# Patient Record
Sex: Female | Born: 1965
Health system: Southern US, Community
[De-identification: ages and names within clinical notes are randomized; demographics above are authoritative.]

## PROBLEM LIST (undated history)

## (undated) DIAGNOSIS — F32A Depression, unspecified: Secondary | ICD-10-CM

## (undated) DIAGNOSIS — I10 Essential (primary) hypertension: Secondary | ICD-10-CM

## (undated) DIAGNOSIS — K219 Gastro-esophageal reflux disease without esophagitis: Secondary | ICD-10-CM

## (undated) DIAGNOSIS — F419 Anxiety disorder, unspecified: Secondary | ICD-10-CM

## (undated) DIAGNOSIS — R011 Cardiac murmur, unspecified: Secondary | ICD-10-CM

## (undated) DIAGNOSIS — T7840XA Allergy, unspecified, initial encounter: Secondary | ICD-10-CM

## (undated) DIAGNOSIS — J45909 Unspecified asthma, uncomplicated: Secondary | ICD-10-CM

## (undated) DIAGNOSIS — F329 Major depressive disorder, single episode, unspecified: Secondary | ICD-10-CM

## (undated) DIAGNOSIS — G4730 Sleep apnea, unspecified: Secondary | ICD-10-CM

## (undated) DIAGNOSIS — E785 Hyperlipidemia, unspecified: Secondary | ICD-10-CM

## (undated) DIAGNOSIS — Z720 Tobacco use: Secondary | ICD-10-CM

## (undated) HISTORY — PX: OTHER SURGICAL HISTORY: SHX169

## (undated) HISTORY — DX: Tobacco use: Z72.0

## (undated) HISTORY — DX: Sleep apnea, unspecified: G47.30

## (undated) HISTORY — DX: Cardiac murmur, unspecified: R01.1

## (undated) HISTORY — DX: Unspecified asthma, uncomplicated: J45.909

## (undated) HISTORY — DX: Depression, unspecified: F32.A

## (undated) HISTORY — DX: Anxiety disorder, unspecified: F41.9

## (undated) HISTORY — DX: Hyperlipidemia, unspecified: E78.5

## (undated) HISTORY — DX: Allergy, unspecified, initial encounter: T78.40XA

## (undated) HISTORY — DX: Major depressive disorder, single episode, unspecified: F32.9

---

## 2006-04-23 ENCOUNTER — Ambulatory Visit: Payer: Self-pay | Admitting: Endocrinology

## 2006-06-19 HISTORY — PX: ABDOMINAL HYSTERECTOMY: SHX81

## 2007-04-04 LAB — HM PAP SMEAR: HM Pap smear: NORMAL

## 2007-11-06 ENCOUNTER — Ambulatory Visit: Payer: Self-pay | Admitting: Obstetrics & Gynecology

## 2007-11-14 ENCOUNTER — Ambulatory Visit: Payer: Self-pay | Admitting: Obstetrics & Gynecology

## 2008-01-27 ENCOUNTER — Ambulatory Visit: Payer: Self-pay | Admitting: Obstetrics & Gynecology

## 2008-02-11 ENCOUNTER — Ambulatory Visit: Payer: Self-pay | Admitting: Obstetrics & Gynecology

## 2009-04-07 ENCOUNTER — Observation Stay: Payer: Self-pay | Admitting: Internal Medicine

## 2010-03-08 ENCOUNTER — Ambulatory Visit: Payer: Self-pay

## 2010-07-05 ENCOUNTER — Ambulatory Visit: Payer: Self-pay | Admitting: Internal Medicine

## 2010-07-26 ENCOUNTER — Ambulatory Visit: Payer: Self-pay | Admitting: Internal Medicine

## 2010-11-07 ENCOUNTER — Emergency Department: Payer: Self-pay | Admitting: Emergency Medicine

## 2011-04-04 LAB — HM MAMMOGRAPHY: HM Mammogram: NORMAL

## 2011-05-23 ENCOUNTER — Ambulatory Visit: Payer: Self-pay | Admitting: Internal Medicine

## 2011-06-05 ENCOUNTER — Ambulatory Visit: Payer: Self-pay | Admitting: Family Medicine

## 2011-09-16 ENCOUNTER — Ambulatory Visit: Payer: Self-pay | Admitting: Neurology

## 2012-04-03 ENCOUNTER — Other Ambulatory Visit: Payer: Self-pay

## 2012-04-03 ENCOUNTER — Encounter: Payer: Self-pay | Admitting: Internal Medicine

## 2012-04-03 ENCOUNTER — Ambulatory Visit (INDEPENDENT_AMBULATORY_CARE_PROVIDER_SITE_OTHER): Payer: PRIVATE HEALTH INSURANCE | Admitting: Internal Medicine

## 2012-04-03 VITALS — BP 118/72 | HR 81 | Temp 98.3°F | Ht 65.0 in | Wt 212.2 lb

## 2012-04-03 DIAGNOSIS — F17200 Nicotine dependence, unspecified, uncomplicated: Secondary | ICD-10-CM

## 2012-04-03 DIAGNOSIS — F411 Generalized anxiety disorder: Secondary | ICD-10-CM

## 2012-04-03 DIAGNOSIS — J45909 Unspecified asthma, uncomplicated: Secondary | ICD-10-CM

## 2012-04-03 DIAGNOSIS — Z720 Tobacco use: Secondary | ICD-10-CM

## 2012-04-03 DIAGNOSIS — E785 Hyperlipidemia, unspecified: Secondary | ICD-10-CM

## 2012-04-03 MED ORDER — ALPRAZOLAM ER 1 MG PO TB24: 1.0000 mg | ORAL_TABLET | ORAL | Status: DC

## 2012-04-03 MED ORDER — BUPROPION HCL ER (SR) 150 MG PO TB12: 150.0000 mg | ORAL_TABLET | Freq: Every day | ORAL | Status: DC

## 2012-04-03 NOTE — Telephone Encounter (Signed)
Patient request refill for Celexa 10 mg sent to [facility].
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
## 2012-04-03 NOTE — Patient Instructions (Addendum)
Decrease the  wellbutrin to once daily in the morning until gone   Start alprazolam Er 1 tablet daily in the morning ,  You can start with 1/2 tablet if too strong .  Start it this Friday   continue the abilify and the citalopram  Use MyChart to let me know in one week how you are feeling.

## 2012-04-04 ENCOUNTER — Telehealth: Payer: Self-pay | Admitting: Internal Medicine

## 2012-04-04 MED ORDER — CITALOPRAM HYDROBROMIDE 10 MG PO TABS: 10.0000 mg | ORAL_TABLET | Freq: Every day | ORAL | Status: DC

## 2012-04-04 NOTE — Telephone Encounter (Signed)
Spouse came in checking on rx citalopam [facility]

## 2012-04-05 NOTE — Telephone Encounter (Signed)
Rx has been sent electronic to [facility]

## 2012-04-06 ENCOUNTER — Encounter: Payer: Self-pay | Admitting: Internal Medicine

## 2012-04-06 DIAGNOSIS — J301 Allergic rhinitis due to pollen: Secondary | ICD-10-CM | POA: Insufficient documentation

## 2012-04-06 DIAGNOSIS — F418 Other specified anxiety disorders: Secondary | ICD-10-CM | POA: Insufficient documentation

## 2012-04-06 DIAGNOSIS — J441 Chronic obstructive pulmonary disease with (acute) exacerbation: Secondary | ICD-10-CM | POA: Insufficient documentation

## 2012-04-06 DIAGNOSIS — Z720 Tobacco use: Secondary | ICD-10-CM | POA: Insufficient documentation

## 2012-04-06 DIAGNOSIS — E785 Hyperlipidemia, unspecified: Secondary | ICD-10-CM | POA: Insufficient documentation

## 2012-04-06 DIAGNOSIS — R011 Cardiac murmur, unspecified: Secondary | ICD-10-CM | POA: Insufficient documentation

## 2012-04-06 DIAGNOSIS — F411 Generalized anxiety disorder: Secondary | ICD-10-CM | POA: Insufficient documentation

## 2012-04-06 DIAGNOSIS — T7840XA Allergy, unspecified, initial encounter: Secondary | ICD-10-CM | POA: Insufficient documentation

## 2012-04-06 NOTE — Assessment & Plan Note (Signed)
Given her chronic tobacco abuse, I suspect that her symptoms are due to either chronic bronchitis or  COPD. She has never had pulmonary function tests. We will address this at the next visit.

## 2012-04-06 NOTE — Assessment & Plan Note (Addendum)
She has a strong family history of coronary artery disease. She's also a daily smoker. I requested labs from her previous physician. Goal the have her do to quit smoking and

## 2012-04-06 NOTE — Assessment & Plan Note (Signed)
She's been prescribed citalopram Wellbutrin and Abilify by her previous physician. I think that the Wellbutrin is probably aggravating her anxiety although it may be treating her depression. I have reduced her dose to one tablet daily in the morning and admitted the afternoon dose. I then alprazolam ER for management of persistent anxiety and panic. I will have her come back in one month.

## 2012-04-06 NOTE — Assessment & Plan Note (Signed)
Risks of continued tobacco use were discussed. She is not currently interested in tobacco cessation. We will readdress this once her anxiety is under control.

## 2012-04-09 ENCOUNTER — Encounter: Payer: Self-pay | Admitting: Internal Medicine

## 2012-04-09 ENCOUNTER — Telehealth: Payer: Self-pay | Admitting: Internal Medicine

## 2012-04-09 DIAGNOSIS — G8929 Other chronic pain: Secondary | ICD-10-CM | POA: Insufficient documentation

## 2012-04-09 DIAGNOSIS — M25519 Pain in unspecified shoulder: Secondary | ICD-10-CM | POA: Insufficient documentation

## 2012-04-09 MED ORDER — TRAMADOL HCL 50 MG PO TABS: 50.0000 mg | ORAL_TABLET | Freq: Three times a day (TID) | ORAL | Status: DC | PRN

## 2012-04-09 NOTE — Telephone Encounter (Signed)
Caller: [name]/Patient; Patient Name: Melanie Fisher; PCP: Duncan Dull (Adults only); Best Callback Phone Number: [phone]  Patient states she was seen in office by Dr. Darrick Huntsman for left arm discomfort. States no imrovement after using BenGay, Tylenol Arthritis Strength. Patient c/o left upper arm pain, onset X 1 year. States sx increased X 3 months. States she developed new tingling in fingertips, onset 04/08/12. Denies new weakness or tumbness. Fingers pink and warm. No swelling noted. States pain is intolerable with movement. Triage per Arm Non-Injury Protocol and Numbness or tingling Protocol. No emergent sx identified. Care advice given per guidelines related to positive triage assessment for " Severe pain with movement that limits normal activities" and " New or worsening change in sensation in extremity." Patient advised to avoid activities that cause severe pain. Advised no driving. Call back parameters reviewed. Patient advised appt. within 24 hours, per Protocol. Patient declines offered appt. for 1130 04/09/12. States she will return call 04/10/12 0800 to schedule appt. Call back parameters reviewed. Patient verbalizes understanding.

## 2012-04-09 NOTE — Telephone Encounter (Signed)
Please tell her that I have sent an rx for tramadol,  A pain reliver,  To [facility],  She can take it every 6 to 8 hours as needed  for pain .  It will not upset her stomach and can be combined with tyelnol or alleve.

## 2012-04-09 NOTE — Telephone Encounter (Signed)
Left message on patient home vm letting her know that Rx Tramadol was sent to [facility] .

## 2012-05-06 ENCOUNTER — Other Ambulatory Visit: Payer: Self-pay | Admitting: Internal Medicine

## 2012-05-06 ENCOUNTER — Encounter: Payer: Self-pay | Admitting: Internal Medicine

## 2012-05-06 ENCOUNTER — Ambulatory Visit (INDEPENDENT_AMBULATORY_CARE_PROVIDER_SITE_OTHER): Payer: PRIVATE HEALTH INSURANCE | Admitting: Internal Medicine

## 2012-05-06 VITALS — BP 120/72 | HR 92 | Temp 98.2°F | Ht 65.0 in | Wt 215.5 lb

## 2012-05-06 DIAGNOSIS — J45909 Unspecified asthma, uncomplicated: Secondary | ICD-10-CM

## 2012-05-06 DIAGNOSIS — J4489 Other specified chronic obstructive pulmonary disease: Secondary | ICD-10-CM

## 2012-05-06 DIAGNOSIS — M25519 Pain in unspecified shoulder: Secondary | ICD-10-CM

## 2012-05-06 DIAGNOSIS — F17200 Nicotine dependence, unspecified, uncomplicated: Secondary | ICD-10-CM

## 2012-05-06 DIAGNOSIS — F32A Depression, unspecified: Secondary | ICD-10-CM

## 2012-05-06 DIAGNOSIS — Z720 Tobacco use: Secondary | ICD-10-CM

## 2012-05-06 DIAGNOSIS — IMO0002 Reserved for concepts with insufficient information to code with codable children: Secondary | ICD-10-CM

## 2012-05-06 DIAGNOSIS — F529 Unspecified sexual dysfunction not due to a substance or known physiological condition: Secondary | ICD-10-CM

## 2012-05-06 DIAGNOSIS — Z62810 Personal history of physical and sexual abuse in childhood: Secondary | ICD-10-CM

## 2012-05-06 DIAGNOSIS — F329 Major depressive disorder, single episode, unspecified: Secondary | ICD-10-CM

## 2012-05-06 DIAGNOSIS — J449 Chronic obstructive pulmonary disease, unspecified: Secondary | ICD-10-CM

## 2012-05-06 DIAGNOSIS — G8929 Other chronic pain: Secondary | ICD-10-CM

## 2012-05-06 DIAGNOSIS — F3289 Other specified depressive episodes: Secondary | ICD-10-CM

## 2012-05-06 LAB — COMPREHENSIVE METABOLIC PANEL
Albumin: 4.2 g/dL (ref 3.4–5.0)
Alkaline Phosphatase: 83 U/L (ref 50–136)
Anion Gap: 7 (ref 7–16)
BUN: 30 mg/dL — ABNORMAL HIGH (ref 7–18)
Bilirubin,Total: 0.3 mg/dL (ref 0.2–1.0)
Calcium, Total: 9.3 mg/dL (ref 8.5–10.1)
Chloride: 109 mmol/L — ABNORMAL HIGH (ref 98–107)
Co2: 24 mmol/L (ref 21–32)
Creatinine: 0.87 mg/dL (ref 0.60–1.30)
EGFR (African American): >60
EGFR (Non-African Amer.): >60
Glucose: 93 mg/dL (ref 65–99)
Osmolality: 285 (ref 275–301)
Potassium: 3.8 mmol/L (ref 3.5–5.1)
SGOT(AST): 17 U/L (ref 15–37)
SGPT (ALT): 27 U/L (ref 12–78)
Sodium: 140 mmol/L (ref 136–145)
Total Protein: 7.9 g/dL (ref 6.4–8.2)

## 2012-05-06 LAB — CBC WITH DIFFERENTIAL/PLATELET
Basophil #: 0 10*3/uL (ref 0.0–0.1)
Basophil %: 0.5 %
Eosinophil #: 0.1 10*3/uL (ref 0.0–0.7)
Eosinophil %: 1.3 %
HCT: 39.1 % (ref 35.0–47.0)
HGB: 13 g/dL (ref 12.0–16.0)
Lymphocyte #: 1.8 10*3/uL (ref 1.0–3.6)
Lymphocyte %: 23.5 %
MCH: 30.9 pg (ref 26.0–34.0)
MCHC: 33.4 g/dL (ref 32.0–36.0)
MCV: 93 fL (ref 80–100)
Monocyte #: 0.4 x10 3/mm (ref 0.2–0.9)
Monocyte %: 5.6 %
Neutrophil #: 5.3 10*3/uL (ref 1.4–6.5)
Neutrophil %: 69.1 %
Platelet: 201 10*3/uL (ref 150–440)
RBC: 4.23 10*6/uL (ref 3.80–5.20)
RDW: 12.5 % (ref 11.5–14.5)
WBC: 7.6 10*3/uL (ref 3.6–11.0)

## 2012-05-06 LAB — TSH: Thyroid Stimulating Horm: 3.01 u[IU]/mL

## 2012-05-06 LAB — LIPID PANEL
Cholesterol: 189 mg/dL (ref 0–200)
HDL Cholesterol: 64 mg/dL — ABNORMAL HIGH (ref 40–60)
Ldl Cholesterol, Calc: 104 mg/dL — ABNORMAL HIGH (ref 0–100)
Triglycerides: 106 mg/dL (ref 0–200)
VLDL Cholesterol, Calc: 21 mg/dL (ref 5–40)

## 2012-05-06 MED ORDER — TRAMADOL HCL 50 MG PO TABS: 50.0000 mg | ORAL_TABLET | Freq: Three times a day (TID) | ORAL | Status: DC | PRN

## 2012-05-06 MED ORDER — BUPROPION HCL ER (SR) 150 MG PO TB12: 150.0000 mg | ORAL_TABLET | Freq: Two times a day (BID) | ORAL | Status: DC
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
## 2012-05-06 NOTE — Patient Instructions (Addendum)
I am ordering pulmonary function tests to assess your lung function.  Please try to quit smoking  Resume wellbutrin at twice daily for your concentration

## 2012-05-07 LAB — HEMOGLOBIN A1C: Hemoglobin A1C: 5.7 % (ref 4.2–6.3)

## 2012-05-08 ENCOUNTER — Encounter: Payer: Self-pay | Admitting: Internal Medicine
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
## 2012-05-08 NOTE — Assessment & Plan Note (Signed)
likley chronic bronchitis.COPD secondary to tobacco abuse. Needs PFTs.  continue albuterol.  Smoking cessation encouraged

## 2012-05-08 NOTE — Assessment & Plan Note (Signed)
Risks of continued tobacco use were discussed. She is  currently interested in tobacco cessation and already taking wellbutrin.  Chantix not advised due to current symptoms of depression/

## 2012-05-14 ENCOUNTER — Ambulatory Visit: Payer: Self-pay | Admitting: Internal Medicine

## 2012-05-17 ENCOUNTER — Encounter: Payer: Self-pay | Admitting: Internal Medicine

## 2012-05-20 ENCOUNTER — Encounter: Payer: Self-pay | Admitting: Internal Medicine

## 2012-05-23 LAB — PULMONARY FUNCTION TEST

## 2012-05-26 ENCOUNTER — Telehealth: Payer: Self-pay | Admitting: Internal Medicine

## 2012-05-26 DIAGNOSIS — J449 Chronic obstructive pulmonary disease, unspecified: Secondary | ICD-10-CM

## 2012-05-26 NOTE — Telephone Encounter (Signed)
Pulmonary function tests confirm that she has COPD, at the young age of 73.  Its time for her to quit smoking before these changes become permanent

## 2012-05-27 NOTE — Telephone Encounter (Signed)
Patient notified of instructions as directed. 

## 2012-06-05 ENCOUNTER — Telehealth: Payer: Self-pay | Admitting: Internal Medicine

## 2012-06-05 NOTE — Telephone Encounter (Signed)
Alprazolam ER 1 mg tab  Take one tablet every morning # 30

## 2012-06-06 ENCOUNTER — Other Ambulatory Visit: Payer: Self-pay

## 2012-06-06 NOTE — Telephone Encounter (Signed)
Refill request for Alprazolam 1 mg. Ok to refill?

## 2012-06-09 MED ORDER — ALPRAZOLAM ER 1 MG PO TB24: 1.0000 mg | ORAL_TABLET | ORAL | Status: DC

## 2012-06-10 ENCOUNTER — Other Ambulatory Visit: Payer: Self-pay | Admitting: *Deleted

## 2012-09-03 ENCOUNTER — Encounter: Payer: Self-pay | Admitting: Internal Medicine

## 2012-09-03 ENCOUNTER — Ambulatory Visit (INDEPENDENT_AMBULATORY_CARE_PROVIDER_SITE_OTHER): Payer: 59 | Admitting: Internal Medicine

## 2012-09-03 ENCOUNTER — Other Ambulatory Visit (HOSPITAL_COMMUNITY)
Admission: RE | Admit: 2012-09-03 | Discharge: 2012-09-03 | Disposition: A | Discharge status: Home / Self Care | Payer: 59 | Source: Ambulatory Visit | Attending: Internal Medicine | Admitting: Internal Medicine

## 2012-09-03 VITALS — BP 128/90 | HR 75 | Temp 98.0°F | Ht 64.0 in | Wt 207.0 lb

## 2012-09-03 DIAGNOSIS — Z0000 Encounter for general adult medical examination without abnormal findings: Secondary | ICD-10-CM

## 2012-09-03 DIAGNOSIS — Z01419 Encounter for gynecological examination (general) (routine) without abnormal findings: Secondary | ICD-10-CM | POA: Insufficient documentation

## 2012-09-03 DIAGNOSIS — Z1151 Encounter for screening for human papillomavirus (HPV): Secondary | ICD-10-CM | POA: Insufficient documentation

## 2012-09-03 DIAGNOSIS — Z124 Encounter for screening for malignant neoplasm of cervix: Secondary | ICD-10-CM

## 2012-09-03 MED ORDER — ALPRAZOLAM ER 1 MG PO TB24: 1.0000 mg | ORAL_TABLET | ORAL | Status: DC

## 2012-09-03 MED ORDER — CITALOPRAM HYDROBROMIDE 10 MG PO TABS: 10.0000 mg | ORAL_TABLET | Freq: Every day | ORAL | Status: DC

## 2012-09-03 MED ORDER — MOMETASONE FURO-FORMOTEROL FUM 100-5 MCG/ACT IN AERO: 2.0000 | INHALATION_SPRAY | Freq: Two times a day (BID) | RESPIRATORY_TRACT | Status: DC

## 2012-09-03 MED ORDER — BUPROPION HCL ER (SR) 150 MG PO TB12: 150.0000 mg | ORAL_TABLET | Freq: Two times a day (BID) | ORAL | Status: DC

## 2012-09-03 MED ORDER — TRAMADOL HCL 50 MG PO TABS: 50.0000 mg | ORAL_TABLET | Freq: Three times a day (TID) | ORAL | Status: DC | PRN

## 2012-09-03 MED ORDER — ARIPIPRAZOLE 10 MG PO TABS: 10.0000 mg | ORAL_TABLET | Freq: Every day | ORAL | Status: DC

## 2012-09-03 NOTE — Progress Notes (Signed)
Patient ID: Melanie Fisher, female   DOB: 04/26/1966, 47 y.o.   MRN: [id number]  Subjective:     Melanie Fisher is a 47 y.o. female and is here for a comprehensive physical exam. The patient reports no problems.  History   Social History  . Marital Status: Married    Spouse Name: N/A    Number of Children: N/A  . Years of Education: N/A   Occupational History  . Not on file.   Social History Main Topics  . Smoking status: Current Every Day Smoker -- 0.15 packs/day  . Smokeless tobacco: Not on file  . Alcohol Use: Yes  . Drug Use: No  . Sexually Active: Not on file   Other Topics Concern  . Not on file   Social History Narrative  . No narrative on file   Health Maintenance  Topic Date Due  . Influenza Vaccine  02/17/1965  . Tetanus/tdap  08/07/1983  . Pap Smear  04/03/2010    The following portions of the patient's history were reviewed and updated as appropriate: allergies, current medications, past family history, past medical history, past social history, past surgical history and problem list.  Review of Systems A comprehensive review of systems was negative.   Objective:   General Appearance:    Alert, cooperative, no distress, appears stated age  Head:    Normocephalic, without obvious abnormality, atraumatic  Eyes:    PERRL, conjunctiva/corneas clear, EOM's intact, fundi    benign, both eyes  Ears:    Normal TM's and external ear canals, both ears  Nose:   Nares normal, septum midline, mucosa normal, no drainage    or sinus tenderness  Throat:   Lips, mucosa, and tongue normal; teeth and gums normal  Neck:   Supple, symmetrical, trachea midline, no adenopathy;    thyroid:  no enlargement/tenderness/nodules; no carotid   bruit or JVD  Back:     Symmetric, no curvature, ROM normal, no CVA tenderness  Lungs:     Clear to auscultation bilaterally, respirations unlabored  Chest Wall:    No tenderness or deformity   Heart:    Regular rate and rhythm, S1  and S2 normal, no murmur, rub   or gallop  Breast Exam:    No tenderness, masses, or nipple abnormality  Abdomen:     Soft, non-tender, bowel sounds active all four quadrants,    no masses, no organomegaly  Genitalia:    Pelvic: cervix normal in appearance, external genitalia normal, no adnexal masses or tenderness, no cervical motion tenderness, rectovaginal septum normal, uterus normal size, shape, and consistency and vagina normal without discharge  Extremities:   Extremities normal, atraumatic, no cyanosis or edema  Pulses:   2+ and symmetric all extremities  Skin:   Skin color, texture, turgor normal, no rashes or lesions  Lymph nodes:   Cervical, supraclavicular, and axillary nodes normal  Neurologic:   CNII-XII intact, normal strength, sensation and reflexes    throughout    Assessment:   Routine general medical examination at a health care facility Annual exam including breast , pelvic and PAP smear were done today. All screenings have been brought up to date.    Updated Medication List Outpatient Encounter Prescriptions as of 09/03/2012  Medication Sig Dispense Refill  . ALPRAZolam (XANAX XR) 1 MG 24 hr tablet Take 1 tablet (1 mg total) by mouth every morning.  30 tablet  3  . ARIPiprazole (ABILIFY) 10 MG tablet Take 1 tablet (10 mg total)  by mouth daily.  30 tablet  5  . buPROPion (WELLBUTRIN SR) 150 MG 12 hr tablet Take 1 tablet (150 mg total) by mouth 2 (two) times daily.  60 tablet  5  . citalopram (CELEXA) 10 MG tablet Take 1 tablet (10 mg total) by mouth daily.  30 tablet  3  . mometasone-formoterol (DULERA) 100-5 MCG/ACT AERO Inhale 2 puffs into the lungs 2 (two) times daily.  13 g  11  . traMADol (ULTRAM) 50 MG tablet Take 1 tablet (50 mg total) by mouth every 8 (eight) hours as needed for pain.  90 tablet  3  . [DISCONTINUED] ALPRAZolam (XANAX XR) 1 MG 24 hr tablet Take 1 tablet (1 mg total) by mouth every morning.  30 tablet  3  . [DISCONTINUED] ARIPiprazole (ABILIFY)  10 MG tablet Take 10 mg by mouth daily.      . [DISCONTINUED] buPROPion (WELLBUTRIN SR) 150 MG 12 hr tablet Take 1 tablet (150 mg total) by mouth 2 (two) times daily.  60 tablet  5  . [DISCONTINUED] citalopram (CELEXA) 10 MG tablet Take 1 tablet (10 mg total) by mouth daily.  30 tablet  3  . [DISCONTINUED] mometasone-formoterol (DULERA) 100-5 MCG/ACT AERO Inhale 2 puffs into the lungs 2 (two) times daily.      . [DISCONTINUED] traMADol (ULTRAM) 50 MG tablet Take 1 tablet (50 mg total) by mouth every 8 (eight) hours as needed for pain.  90 tablet  3   No facility-administered encounter medications on file as of 09/03/2012.

## 2012-09-04 DIAGNOSIS — Z0000 Encounter for general adult medical examination without abnormal findings: Secondary | ICD-10-CM | POA: Insufficient documentation

## 2012-09-04 NOTE — Assessment & Plan Note (Signed)
Annual exam including breast , pelvic and PAP smear were done today. All screenings have been brought up to date.

## 2012-09-06 ENCOUNTER — Encounter: Payer: Self-pay | Admitting: General Practice

## 2012-09-11 LAB — HM PAP SMEAR: HM Pap smear: NORMAL

## 2012-09-19 ENCOUNTER — Telehealth: Payer: Self-pay | Admitting: Internal Medicine
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
## 2012-09-20 ENCOUNTER — Telehealth: Payer: Self-pay | Admitting: Internal Medicine

## 2012-09-20 NOTE — Telephone Encounter (Signed)
Pt was returning office call. Rn checked EPIC and notified pt that per her medication request earlier/pt needs an appt. Pt unable to schedule until next week/pt transferred to the scheduler.

## 2012-09-20 NOTE — Telephone Encounter (Signed)
Pt called cannot reach. Left a message with a family member also for her to return the call.

## 2012-09-20 NOTE — Telephone Encounter (Signed)
Please offer appt with kme or raqel todayl,  Cannot prescribve sedatives by phone

## 2012-09-25 ENCOUNTER — Ambulatory Visit (INDEPENDENT_AMBULATORY_CARE_PROVIDER_SITE_OTHER): Payer: 59 | Admitting: Internal Medicine

## 2012-09-25 ENCOUNTER — Encounter: Payer: Self-pay | Admitting: Internal Medicine

## 2012-09-25 VITALS — BP 142/82 | HR 82 | Temp 98.0°F | Resp 18 | Wt 210.2 lb

## 2012-09-25 DIAGNOSIS — F411 Generalized anxiety disorder: Secondary | ICD-10-CM

## 2012-09-25 MED ORDER — ZOLPIDEM TARTRATE ER 12.5 MG PO TBCR: 12.5000 mg | EXTENDED_RELEASE_TABLET | Freq: Every evening | ORAL | Status: DC | PRN

## 2012-09-25 NOTE — Patient Instructions (Addendum)
Take the alprazolam XR in the morning instead of evening,    Continue bupropion  For now twice daily  ambien cr for insomnia   I agree with using the hospital's counselling program.
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
## 2012-09-27 ENCOUNTER — Encounter: Payer: Self-pay | Admitting: Internal Medicine
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
## 2012-11-21 ENCOUNTER — Other Ambulatory Visit: Payer: Self-pay | Admitting: *Deleted

## 2012-11-21 MED ORDER — ZOLPIDEM TARTRATE ER 12.5 MG PO TBCR: 12.5000 mg | EXTENDED_RELEASE_TABLET | Freq: Every evening | ORAL | Status: DC | PRN

## 2012-11-21 MED ORDER — ALPRAZOLAM ER 1 MG PO TB24: 1.0000 mg | ORAL_TABLET | ORAL | Status: DC

## 2012-11-21 NOTE — Telephone Encounter (Signed)
Ok to refill,  printed rx  

## 2012-11-25 ENCOUNTER — Telehealth: Payer: Self-pay | Admitting: Internal Medicine

## 2012-11-25 NOTE — Telephone Encounter (Signed)
Called pharmacy to see if they received the prescriptions, they still had not received the rx. Phoned in the refills for patient prescriptions.

## 2012-11-25 NOTE — Telephone Encounter (Signed)
Pt spouse called [facility]  [facility] has not received her rx for xanax and ambien Please adivse

## 2012-12-14 ENCOUNTER — Emergency Department: Payer: Self-pay | Admitting: Emergency Medicine

## 2012-12-24 ENCOUNTER — Other Ambulatory Visit: Payer: Self-pay | Admitting: *Deleted

## 2012-12-25 MED ORDER — ALPRAZOLAM ER 1 MG PO TB24: 1.0000 mg | ORAL_TABLET | ORAL | Status: DC

## 2013-01-02 ENCOUNTER — Telehealth: Payer: Self-pay | Admitting: Internal Medicine

## 2013-01-02 NOTE — Telephone Encounter (Signed)
Pt is needing refill on Ambien and she uses [facility]

## 2013-01-03 MED ORDER — ZOLPIDEM TARTRATE ER 12.5 MG PO TBCR: 12.5000 mg | EXTENDED_RELEASE_TABLET | Freq: Every evening | ORAL | Status: DC | PRN

## 2013-01-03 NOTE — Telephone Encounter (Signed)
Last OV 09/25/12  Will print refill for Dr. Darrick Huntsman. Printed and on counter to sign.

## 2013-02-07 ENCOUNTER — Other Ambulatory Visit: Payer: Self-pay | Admitting: *Deleted

## 2013-02-12 MED ORDER — ZOLPIDEM TARTRATE ER 12.5 MG PO TBCR: 12.5000 mg | EXTENDED_RELEASE_TABLET | Freq: Every evening | ORAL | Status: DC | PRN

## 2013-02-12 NOTE — Telephone Encounter (Signed)
Okay to refill? Request was sent on Friday 8/22

## 2013-02-13 ENCOUNTER — Telehealth: Payer: Self-pay | Admitting: Internal Medicine

## 2013-02-13 NOTE — Telephone Encounter (Signed)
The pharmacy has not received the prescription for this patient's Ambien.

## 2013-02-13 NOTE — Telephone Encounter (Signed)
Script faxed.

## 2013-02-18 ENCOUNTER — Encounter: Payer: Self-pay | Admitting: Adult Health

## 2013-02-18 ENCOUNTER — Ambulatory Visit (INDEPENDENT_AMBULATORY_CARE_PROVIDER_SITE_OTHER): Payer: 59 | Admitting: Adult Health

## 2013-02-18 VITALS — BP 126/78 | HR 75 | Temp 98.2°F | Resp 12 | Ht 64.0 in | Wt 216.0 lb

## 2013-02-18 DIAGNOSIS — R21 Rash and other nonspecific skin eruption: Secondary | ICD-10-CM | POA: Insufficient documentation

## 2013-02-18 MED ORDER — VALACYCLOVIR HCL 500 MG PO TABS: ORAL_TABLET | ORAL | Status: DC

## 2013-02-18 MED ORDER — VALACYCLOVIR HCL 500 MG PO TABS: 500.0000 mg | ORAL_TABLET | Freq: Two times a day (BID) | ORAL | Status: DC

## 2013-02-18 NOTE — Progress Notes (Signed)
  Subjective:    Patient ID: Melanie Fisher, female    DOB: 1965-10-20, 47 y.o.   MRN: [id number]  HPI  Patient is a pleasant 47 year old female with a history of herpes who presents with a small outbreak on her left cheek. She first noticed the outbreak over the weekend. She denies pain at the site of rash. She has run out of medication that she took daily at one point. She has not taken medication in a while. She is interested in restarting prophylactic medication. No other concerns at this time.   Current Outpatient Prescriptions on File Prior to Visit  Medication Sig Dispense Refill  . ALPRAZolam (XANAX XR) 1 MG 24 hr tablet Take 1 tablet (1 mg total) by mouth every morning.  30 tablet  3  . ARIPiprazole (ABILIFY) 10 MG tablet Take 1 tablet (10 mg total) by mouth daily.  30 tablet  5  . buPROPion (WELLBUTRIN SR) 150 MG 12 hr tablet Take 1 tablet (150 mg total) by mouth 2 (two) times daily.  60 tablet  5  . citalopram (CELEXA) 10 MG tablet Take 1 tablet (10 mg total) by mouth daily.  30 tablet  3  . mometasone-formoterol (DULERA) 100-5 MCG/ACT AERO Inhale 2 puffs into the lungs 2 (two) times daily.  13 g  11  . traMADol (ULTRAM) 50 MG tablet Take 1 tablet (50 mg total) by mouth every 8 (eight) hours as needed for pain.  90 tablet  3  . zolpidem (AMBIEN CR) 12.5 MG CR tablet Take 1 tablet (12.5 mg total) by mouth at bedtime as needed for sleep.  30 tablet  0   No current facility-administered medications on file prior to visit.    Review of Systems  Skin: Positive for rash.       Left cheek    BP 126/78  Pulse 75  Temp(Src) 98.2 F (36.8 C) (Oral)  Resp 12  Ht 5\' 4"  (1.626 m)  Wt 216 lb (97.977 kg)  BMI 37.06 kg/m2  SpO2 97%    Objective:   Physical Exam  Constitutional:  Pleasant female in no distress  Cardiovascular: Normal rate and regular rhythm.   Pulmonary/Chest: Effort normal. No respiratory distress.  Skin: There is erythema.  Small area on left cheek with a  vesicular rash. Erythema of the area.       Assessment & Plan:

## 2013-02-18 NOTE — Patient Instructions (Addendum)
  Please start your valacyclovir 500 mg daily.  Remember to schedule your physical exam with Dr. Darrick Huntsman for November. Please do this before leaving the office today.  Call with any questions or concerns.

## 2013-02-18 NOTE — Assessment & Plan Note (Signed)
Small vesicular rash on left cheek in patient with known history of herpes. Start valacyclovir 500 mg daily.

## 2013-02-19 ENCOUNTER — Telehealth: Payer: Self-pay | Admitting: *Deleted

## 2013-02-19 NOTE — Telephone Encounter (Signed)
Patient called stated medication not filled, verified with pharmacy filled on 02/13/13.

## 2013-02-27 ENCOUNTER — Ambulatory Visit: Payer: Self-pay | Admitting: General Practice

## 2013-02-28 ENCOUNTER — Telehealth: Payer: Self-pay | Admitting: Internal Medicine

## 2013-02-28 MED ORDER — HYDROCOD POLST-CHLORPHEN POLST 10-8 MG/5ML PO LQCR: 5.0000 mL | Freq: Two times a day (BID) | ORAL | Status: DC | PRN

## 2013-02-28 NOTE — Telephone Encounter (Signed)
Patient states she was seen at urgent care and faxed over paper work after lunch they will not call her in nothing for cough and she would like to know if you will.

## 2013-02-28 NOTE — Telephone Encounter (Signed)
Pt called wanted to know if dr Darrick Huntsman would call her something in for her cough She went [facility] and they did xray she has a touch of pnuemia  They gave her tussnix pearl rx.  Pt wanted to know if something stronger could be called in. Pt will see if employee [facility] will fax over office note

## 2013-02-28 NOTE — Telephone Encounter (Signed)
Yes, you can send the rx for tussionex.  Warn her that it has hydrocdone in it so might make her sleepy.

## 2013-02-28 NOTE — Telephone Encounter (Signed)
Script sent to [facility] Melanie Fisher patient notified

## 2013-03-13 ENCOUNTER — Encounter: Payer: Self-pay | Admitting: *Deleted

## 2013-03-14 ENCOUNTER — Ambulatory Visit (INDEPENDENT_AMBULATORY_CARE_PROVIDER_SITE_OTHER): Payer: 59 | Admitting: Internal Medicine

## 2013-03-14 VITALS — BP 148/88 | HR 74 | Temp 98.8°F | Resp 16 | Ht 64.0 in | Wt 216.8 lb

## 2013-03-14 DIAGNOSIS — J069 Acute upper respiratory infection, unspecified: Secondary | ICD-10-CM

## 2013-03-14 DIAGNOSIS — F411 Generalized anxiety disorder: Secondary | ICD-10-CM

## 2013-03-14 DIAGNOSIS — Z716 Tobacco abuse counseling: Secondary | ICD-10-CM

## 2013-03-14 DIAGNOSIS — F3289 Other specified depressive episodes: Secondary | ICD-10-CM

## 2013-03-14 DIAGNOSIS — F32A Depression, unspecified: Secondary | ICD-10-CM

## 2013-03-14 DIAGNOSIS — J441 Chronic obstructive pulmonary disease with (acute) exacerbation: Secondary | ICD-10-CM

## 2013-03-14 DIAGNOSIS — F329 Major depressive disorder, single episode, unspecified: Secondary | ICD-10-CM

## 2013-03-14 DIAGNOSIS — Z7189 Other specified counseling: Secondary | ICD-10-CM

## 2013-03-14 DIAGNOSIS — F17200 Nicotine dependence, unspecified, uncomplicated: Secondary | ICD-10-CM

## 2013-03-14 DIAGNOSIS — Z720 Tobacco use: Secondary | ICD-10-CM

## 2013-03-14 MED ORDER — ALPRAZOLAM ER 1 MG PO TB24: 1.0000 mg | ORAL_TABLET | ORAL | Status: DC

## 2013-03-14 MED ORDER — ALPRAZOLAM 0.5 MG PO TABS: ORAL_TABLET | ORAL | Status: DC

## 2013-03-14 MED ORDER — LEVOFLOXACIN 500 MG PO TABS: 500.0000 mg | ORAL_TABLET | Freq: Every day | ORAL | Status: DC

## 2013-03-14 MED ORDER — HYDROCOD POLST-CHLORPHEN POLST 10-8 MG/5ML PO LQCR: 5.0000 mL | Freq: Every evening | ORAL | Status: DC | PRN

## 2013-03-14 NOTE — Patient Instructions (Addendum)
YOU MUST QUIT SMOKING  I have given you an rx for short acting alprazolam to use temporarily to help you quit smoking.   -------------------------------------------------------------------------------------------------------------------------------------------  You have a viral  Syndrome .  The post nasal drip is causing your sore throat and the sinus congestion is causing your ear pressure   Flush your sinuses twice daily with Simply saline nasal spray.  Use benadryl 25 mg every 8 hours (or just at night if it is too sedating) to manage the drainage   Use Sudafed PE 10 to 30 mg   (1 to 3 tablets) every 8 hours to manage the congestion.  Gargle with salt water as often as needed for the sore throat.  Use Delsym OTC for the cough .  Save the tussionex for cough not relieved by Delsym   If the throat is no better  In 3 to 4 days OR  if you develop Temp  > 100.4,  Green/bloody  nasal discharge,  Or severe ear or facial pain,  Start the levaquin   If you start the levaquin,  You will need to take a probiotic (culturelle,  Align,  [employer] ,  Or generic equivalent) to prevent a serious diarrhea .

## 2013-03-14 NOTE — Progress Notes (Signed)
Patient ID: Melanie Fisher, female   DOB: 06/05/1966, 47 y.o.   MRN: [id number]  Patient Active Problem List   Diagnosis Date Noted  . Acute upper respiratory infections of unspecified site 03/15/2013  . Tobacco abuse counseling 03/15/2013  . Rash 02/18/2013  . Routine general medical examination at a health care facility 09/04/2012  . COPD (chronic obstructive pulmonary disease) 05/26/2012  . Chronic shoulder pain 04/09/2012  . Generalized anxiety disorder 04/06/2012  . Tobacco abuse 04/06/2012  . Depression   . Heart murmur   . Hyperlipidemia   . Allergy   . COPD exacerbation     Subjective:  CC:   Chief Complaint  Patient presents with  . Acute Visit    Diagnosed at [facility] walk in with Pneumonia X 2 weeks ago.    HPI:   Melanie Fisher a 47 y.o. female who presents with Cold symptoms.  She was ttreated for PNA 2 weeks ago by [facility] with positive CXR ,  wheezing and purulent cough with azithromycin and  steroids.  Cough improved but has not comnpletely resolved,  No fevers . currenlty having right ear pain,  Post nasal drip and sore throat.   Still smoking ,  1 pack daily  Prior chantix trial gave her dry mouth and insomnia.  Smokes to relieve anxiety   Anxiety uncontrolled despite [place] of xanax xr 1 mg daily,   Citalopram , wellbutrin and abilify     Past Medical History  Diagnosis Date  . Sleep apnea   . Depression   . Heart murmur   . Hyperlipidemia   . Allergy   . Asthma     Past Surgical History  Procedure Laterality Date  . Abdominal hysterectomy  2008    partial       The following portions of the patient's history were reviewed and updated as appropriate: Allergies, current medications, and problem list.    Review of Systems:   12 Pt  review of systems was negative except those addressed in the HPI,     History   Social History  . Marital Status: Married    Spouse Name: N/A    Number of Children: N/A  . Years of  Education: N/A   Occupational History  . Not on file.   Social History Main Topics  . Smoking status: Current Every Day Smoker -- 0.15 packs/day  . Smokeless tobacco: Not on file  . Alcohol Use: Yes     Comment: occasionally  . Drug Use: No  . Sexual Activity: Not Currently   Other Topics Concern  . Not on file   Social History Narrative  . No narrative on file    Objective:  Filed Vitals:   03/14/13 0803  BP: 148/88  Pulse: 74  Temp: 98.8 F (37.1 C)  Resp: 16     General appearance: alert, cooperative and appears stated age Ears: normal TM's and external ear canals both ears Throat: lips, mucosa, and tongue normal; teeth and gums normal Neck: no adenopathy, no carotid bruit, supple, symmetrical, trachea midline and thyroid not enlarged, symmetric, no tenderness/mass/nodules Back: symmetric, no curvature. ROM normal. No CVA tenderness. Lungs: clear to auscultation bilaterally Heart: regular rate and rhythm, S1, S2 normal, no murmur, click, rub or gallop Abdomen: soft, non-tender; bowel sounds normal; no masses,  no organomegaly Pulses: 2+ and symmetric Skin: Skin color, texture, turgor normal. No rashes or lesions Lymph nodes: Cervical, supraclavicular, and axillary nodes normal.  Assessment and Plan:  Generalized anxiety disorder Uncontrolled despite use of alprazolam xr, wellbutrin , celexaand abilify . Because she is using cigarettes to manage her anxiety I have given her 30 day supply of short acting alprazolam but will increase her  Citalopram to 20 mg daily   COPD exacerbation Secondary to recent pneumonia and ongong tobacco abuse .  Wheezing has resolved.   Acute upper respiratory infections of unspecified site This URI is most likely viral given the failure of mild HEENT  symptoms to resolve after a z pack.  I have explained that in viral URIS, an antibiotic will not help the symptoms and will increase the risk of developing diarrhea.,  Continue oral and  nasal decongestants,  Ibuprofen 400 mg and tylenol 650 mq 8 hrs for aches and pains,  And will add tussionex  prn cough  Andabx only if symptoms worsen to include fevers, facial pain, purulent sputum./drainage.   Tobacco abuse The patient was counseled on the dangers of tobacco use, and was advised to quit.  Reviewed strategies to maximize success, including stress management.  Tobacco abuse counseling The patient was counseled on the dangers of tobacco use, and was advised to quit.  Reviewed strategies to maximize success, including stress management.   Updated Medication List Outpatient Encounter Prescriptions as of 03/14/2013  Medication Sig Dispense Refill  . ALPRAZolam (XANAX XR) 1 MG 24 hr tablet Take 1 tablet (1 mg total) by mouth every morning.  30 tablet  3  . ARIPiprazole (ABILIFY) 10 MG tablet Take 1 tablet (10 mg total) by mouth daily.  30 tablet  5  . buPROPion (WELLBUTRIN SR) 150 MG 12 hr tablet Take 1 tablet (150 mg total) by mouth 2 (two) times daily.  60 tablet  5  . citalopram (CELEXA) 20 MG tablet Take 1 tablet (20 mg total) by mouth daily.  30 tablet  3  . mometasone-formoterol (DULERA) 100-5 MCG/ACT AERO Inhale 2 puffs into the lungs 2 (two) times daily.  13 g  11  . traMADol (ULTRAM) 50 MG tablet Take 1 tablet (50 mg total) by mouth every 8 (eight) hours as needed for pain.  90 tablet  3  . valACYclovir (VALTREX) 500 MG tablet 1 tablet daily  30 tablet  11  . zolpidem (AMBIEN CR) 12.5 MG CR tablet Take 1 tablet (12.5 mg total) by mouth at bedtime as needed for sleep.  30 tablet  0  . [DISCONTINUED] ALPRAZolam (XANAX XR) 1 MG 24 hr tablet Take 1 tablet (1 mg total) by mouth every morning.  30 tablet  3  . [DISCONTINUED] citalopram (CELEXA) 10 MG tablet Take 1 tablet (10 mg total) by mouth daily.  30 tablet  3  . ALPRAZolam (XANAX) 0.5 MG tablet One tablet daily if needed for extreme anxiety (in addition to daily XR tablet)  30 tablet  3  . chlorpheniramine-HYDROcodone  (TUSSIONEX) 10-8 MG/5ML LQCR Take 5 mLs by mouth every 12 (twelve) hours as needed.  240 mL  0  . chlorpheniramine-HYDROcodone (TUSSIONEX) 10-8 MG/5ML LQCR Take 5 mLs by mouth at bedtime as needed.  140 mL  0  . levofloxacin (LEVAQUIN) 500 MG tablet Take 1 tablet (500 mg total) by mouth daily.  7 tablet  0   No facility-administered encounter medications on file as of 03/14/2013.     Orders Placed This Encounter  Procedures  . HM PAP SMEAR    No Follow-up on file.

## 2013-03-15 ENCOUNTER — Encounter: Payer: Self-pay | Admitting: Internal Medicine

## 2013-03-15 DIAGNOSIS — Z716 Tobacco abuse counseling: Secondary | ICD-10-CM | POA: Insufficient documentation

## 2013-03-15 DIAGNOSIS — Z87891 Personal history of nicotine dependence: Secondary | ICD-10-CM | POA: Insufficient documentation

## 2013-03-15 DIAGNOSIS — J069 Acute upper respiratory infection, unspecified: Secondary | ICD-10-CM | POA: Insufficient documentation

## 2013-03-15 MED ORDER — CITALOPRAM HYDROBROMIDE 20 MG PO TABS: 20.0000 mg | ORAL_TABLET | Freq: Every day | ORAL | Status: DC

## 2013-03-15 NOTE — Assessment & Plan Note (Signed)
This URI is most likely viral given the failure of mild HEENT  symptoms to resolve after a z pack.  I have explained that in viral URIS, an antibiotic will not help the symptoms and will increase the risk of developing diarrhea.,  Continue oral and nasal decongestants,  Ibuprofen 400 mg and tylenol 650 mq 8 hrs for aches and pains,  And will add tussionex  prn cough  Andabx only if symptoms worsen to include fevers, facial pain, purulent sputum./drainage.

## 2013-03-15 NOTE — Assessment & Plan Note (Signed)
The patient was counseled on the dangers of tobacco use, and was advised to quit.  Reviewed strategies to maximize success, including stress management. 

## 2013-03-15 NOTE — Assessment & Plan Note (Signed)
Uncontrolled despite use of alprazolam xr, wellbutrin , celexaand abilify . Because she is using cigarettes to manage her anxiety I have given her 30 day supply of short acting alprazolam but will increase her  Citalopram to 20 mg daily

## 2013-03-15 NOTE — Assessment & Plan Note (Addendum)
Secondary to recent pneumonia and ongong tobacco abuse .  Wheezing has resolved.

## 2013-03-17 ENCOUNTER — Other Ambulatory Visit: Payer: Self-pay | Admitting: Internal Medicine

## 2013-03-17 NOTE — Telephone Encounter (Signed)
Refill

## 2013-03-18 ENCOUNTER — Telehealth: Payer: Self-pay | Admitting: Internal Medicine

## 2013-03-19 MED ORDER — ZOLPIDEM TARTRATE ER 12.5 MG PO TBCR: 12.5000 mg | EXTENDED_RELEASE_TABLET | Freq: Every evening | ORAL | Status: DC | PRN

## 2013-03-19 NOTE — Telephone Encounter (Signed)
Yes, refill authorized and printed

## 2013-03-20 NOTE — Telephone Encounter (Signed)
Rx faxed

## 2013-03-24 ENCOUNTER — Encounter: Payer: Self-pay | Admitting: Internal Medicine

## 2013-04-07 ENCOUNTER — Encounter: Payer: Self-pay | Admitting: Internal Medicine

## 2013-04-14 ENCOUNTER — Other Ambulatory Visit: Payer: Self-pay | Admitting: *Deleted

## 2013-04-14 NOTE — Telephone Encounter (Signed)
Last visit 03/14/13

## 2013-04-15 MED ORDER — TRAMADOL HCL 50 MG PO TABS: 50.0000 mg | ORAL_TABLET | Freq: Three times a day (TID) | ORAL | Status: DC | PRN

## 2013-04-18 ENCOUNTER — Telehealth: Payer: Self-pay | Admitting: Internal Medicine

## 2013-04-18 NOTE — Telephone Encounter (Signed)
Pt called stated she had a cough and dr Darrick Huntsman gave her a rx for cough cold.  This has caused pt to have yeast infection. [facility]  They close at 5:30 Pt will wait till Monday if this is not called in before 5:30

## 2013-04-19 MED ORDER — FLUCONAZOLE 150 MG PO TABS: 150.0000 mg | ORAL_TABLET | Freq: Every day | ORAL | Status: DC

## 2013-04-19 NOTE — Telephone Encounter (Signed)
rx sent for generic diflucan. To [facility]

## 2013-04-21 NOTE — Telephone Encounter (Signed)
Patient notified

## 2013-04-29 ENCOUNTER — Encounter: Payer: 59 | Admitting: Internal Medicine

## 2013-05-19 ENCOUNTER — Telehealth: Payer: Self-pay | Admitting: Internal Medicine

## 2013-05-19 NOTE — Telephone Encounter (Signed)
Pt says that her new insurance company will not be covering her inhaler in the new year and was wanting to know if she could get a 90 day supply of the before the end of the year ??

## 2013-05-20 MED ORDER — ALBUTEROL SULFATE HFA 108 (90 BASE) MCG/ACT IN AERS: 2.0000 | INHALATION_SPRAY | Freq: Four times a day (QID) | RESPIRATORY_TRACT | Status: DC | PRN

## 2013-05-20 MED ORDER — MOMETASONE FURO-FORMOTEROL FUM 100-5 MCG/ACT IN AERO: 2.0000 | INHALATION_SPRAY | Freq: Two times a day (BID) | RESPIRATORY_TRACT | Status: DC

## 2013-05-20 NOTE — Telephone Encounter (Signed)
Done,  Sent.

## 2013-05-20 NOTE — Telephone Encounter (Signed)
Refill sent electronically for 90 day on Dulera but patient also states taking albuterol cannot find in historical can I send she stated prescribed by past physician she does not see anymore?

## 2013-05-21 NOTE — Telephone Encounter (Signed)
Called pt, VM not set up, unable to notify her Rxs were sent

## 2013-06-02 ENCOUNTER — Emergency Department: Payer: Self-pay | Admitting: Emergency Medicine

## 2013-06-02 ENCOUNTER — Telehealth: Payer: Self-pay | Admitting: Internal Medicine

## 2013-06-02 LAB — BASIC METABOLIC PANEL
Anion Gap: 5 — ABNORMAL LOW (ref 7–16)
BUN: 17 mg/dL (ref 7–18)
Calcium, Total: 9.6 mg/dL (ref 8.5–10.1)
Chloride: 109 mmol/L — ABNORMAL HIGH (ref 98–107)
Co2: 26 mmol/L (ref 21–32)
Creatinine: 0.79 mg/dL (ref 0.60–1.30)
EGFR (African American): >60
EGFR (Non-African Amer.): >60
Glucose: 120 mg/dL — ABNORMAL HIGH (ref 65–99)
Osmolality: 282 (ref 275–301)
Potassium: 4 mmol/L (ref 3.5–5.1)
Sodium: 140 mmol/L (ref 136–145)

## 2013-06-02 LAB — CBC
HCT: 43.5 % (ref 35.0–47.0)
HGB: 14.7 g/dL (ref 12.0–16.0)
MCH: 31.2 pg (ref 26.0–34.0)
MCHC: 33.7 g/dL (ref 32.0–36.0)
MCV: 93 fL (ref 80–100)
Platelet: 186 10*3/uL (ref 150–440)
RBC: 4.7 10*6/uL (ref 3.80–5.20)
RDW: 12.9 % (ref 11.5–14.5)
WBC: 7 10*3/uL (ref 3.6–11.0)

## 2013-06-02 LAB — TROPONIN I: Troponin-I: <0.02 ng/mL

## 2013-06-02 NOTE — Telephone Encounter (Signed)
fyi

## 2013-06-02 NOTE — Telephone Encounter (Signed)
Patient Information:  Caller Name: Peri  Phone: [phone]  Patient: Melanie Fisher, Melanie Fisher  Gender: Female  DOB: 03/06/1966  Age: 47 Years  PCP: Duncan Dull (Adults only)  Pregnant: No  Office Follow Up:  Does the office need to follow up with this patient?: No  Instructions For The Office: N/A  RN Note:  Hysterectomy.  Constant chest tightness rated 6/10 and constant with nausea, no vomiting. Had one loose stool this AM. Drinking fluids and voiding; last void at 1400. Smoker, over weight, dyslipidemia and family history of heart disease.  Symptoms  Reason For Call & Symptoms: Constant chest pain on left side under breast rated 6/10, nausea and belching.  Asking if OK to return to work in [employer] at hospital with symptoms. Seen in [facility] ED 06/02/13 for diarrhea 06/01/13, belching and chest pain.  Diagnosed with viral gastroenteritis.  ED said OK to return to work if she felt like it.  Reviewed Health History In EMR: Yes  Reviewed Medications In EMR: Yes  Reviewed Allergies In EMR: Yes  Reviewed Surgeries / Procedures: Yes  Date of Onset of Symptoms: 06/01/2013  Treatments Tried: Antiacid, Simethicone  Treatments Tried Worked: No OB / GYN:  LMP: Unknown  Guideline(s) Used:  Chest Pain  Disposition Per Guideline:   Call EMS 911 Now  Reason For Disposition Reached:   Chest pain lasting longer than 5 minutes and ANY of the following:  Over 47 years old Over 64 years old and at least one cardiac risk factor (i.e., high blood pressure, diabetes, high cholesterol, obesity, smoker or strong family history of heart disease) Pain is crushing, pressure-like, or heavy  Took nitroglycerin and chest pain was not relieved History of heart disease (i.e., angina, heart attack, bypass surgery, angioplasty, CHF)  Advice Given:  N/A  Patient Refused Recommendation:  Patient Will Go To ED  Plans to have husband take her to ED due to cost of ambulance

## 2013-06-30 ENCOUNTER — Encounter: Payer: Self-pay | Admitting: Internal Medicine

## 2013-06-30 ENCOUNTER — Ambulatory Visit (INDEPENDENT_AMBULATORY_CARE_PROVIDER_SITE_OTHER): Payer: 59 | Admitting: Internal Medicine

## 2013-06-30 ENCOUNTER — Encounter (INDEPENDENT_AMBULATORY_CARE_PROVIDER_SITE_OTHER): Payer: Self-pay

## 2013-06-30 VITALS — BP 142/80 | HR 85 | Temp 98.2°F | Resp 16 | Ht 64.9 in | Wt 215.5 lb

## 2013-06-30 DIAGNOSIS — R197 Diarrhea, unspecified: Secondary | ICD-10-CM

## 2013-06-30 DIAGNOSIS — Z1239 Encounter for other screening for malignant neoplasm of breast: Secondary | ICD-10-CM

## 2013-06-30 DIAGNOSIS — F17200 Nicotine dependence, unspecified, uncomplicated: Secondary | ICD-10-CM

## 2013-06-30 DIAGNOSIS — E669 Obesity, unspecified: Secondary | ICD-10-CM

## 2013-06-30 DIAGNOSIS — J069 Acute upper respiratory infection, unspecified: Secondary | ICD-10-CM

## 2013-06-30 DIAGNOSIS — Z23 Encounter for immunization: Secondary | ICD-10-CM

## 2013-06-30 DIAGNOSIS — Z716 Tobacco abuse counseling: Secondary | ICD-10-CM

## 2013-06-30 DIAGNOSIS — Z7189 Other specified counseling: Secondary | ICD-10-CM

## 2013-06-30 MED ORDER — GUAIFENESIN-CODEINE 100-10 MG/5ML PO SYRP: 5.0000 mL | ORAL_SOLUTION | Freq: Three times a day (TID) | ORAL | Status: DC | PRN

## 2013-06-30 MED ORDER — NICOTINE 21 MG/24HR TD PT24: 21.0000 mg | MEDICATED_PATCH | Freq: Every day | TRANSDERMAL | Status: DC

## 2013-06-30 NOTE — Progress Notes (Signed)
Patient ID: Melanie Fisher, female   DOB: Feb 19, 1966, 48 y.o.   MRN: [id number]    Patient Active Problem List   Diagnosis Date Noted  . Obesity, unspecified 07/01/2013  . Diarrhea 06/30/2013  . Acute upper respiratory infections of unspecified site 03/15/2013  . Tobacco abuse counseling 03/15/2013  . Rash 02/18/2013  . Routine general medical examination at a health care facility 09/04/2012  . COPD (chronic obstructive pulmonary disease) 05/26/2012  . Chronic shoulder pain 04/09/2012  . Generalized anxiety disorder 04/06/2012  . Tobacco abuse 04/06/2012  . Depression   . Heart murmur   . Hyperlipidemia   . Allergy   . COPD exacerbation     Subjective:  CC:   Chief Complaint  Patient presents with  . Annual Exam    HPI:   Melanie Fisher a 48 y.o. female who presents for her annual physical exam. However she is early as her last exam was done in March. Breast exam was done today since she is due for mammogram. She had multiple problems to discuss today.Melanie Fisher   1) cough since Monday,  Started with sneezing .  keeping her up night .  Productive of gray sputum.  No myalgias or fevers. Throat was sore on Monday but resolved.    2) Recurrent episodes of tenesmus and loose stools, accompanied by cramping  Brought on by certain foods including broccoli,  Corn , salads, and [employer] chicken .  Sees undigested corn in her stools,  Occurring about twice weekly. Loose stools also noted to occur without eating.  change in bowel habits began in early November , Prior to trip to [facility]  but after using antibiotic an d a probiotic for  treatment of sinusitis .   3)  Wants to quit smoking and ready to to try.  Past Medical History  Diagnosis Date  . Sleep apnea   . Depression   . Heart murmur   . Hyperlipidemia   . Allergy   . Asthma     Past Surgical History  Procedure Laterality Date  . Abdominal hysterectomy  2008    partial       The following portions of the  patient's history were reviewed and updated as appropriate: Allergies, current medications, and problem list.    Review of Systems:   12 Pt  review of systems was negative except those addressed in the HPI,     History   Social History  . Marital Status: Married    Spouse Name: N/A    Number of Children: N/A  . Years of Education: N/A   Occupational History  . Not on file.   Social History Main Topics  . Smoking status: Current Every Day Smoker -- 0.15 packs/day  . Smokeless tobacco: Not on file  . Alcohol Use: Yes     Comment: occasionally  . Drug Use: No  . Sexual Activity: Not Currently   Other Topics Concern  . Not on file   Social History Narrative  . No narrative on file    Objective:  Filed Vitals:   06/30/13 1500  BP: 142/80  Pulse: 85  Temp: 98.2 F (36.8 C)  Resp: 16     General appearance: alert, cooperative and appears stated age Ears: normal TM's and external ear canals both ears Throat: lips, mucosa, and tongue normal; teeth and gums normal Neck: no adenopathy, no carotid bruit, supple, symmetrical, trachea midline and thyroid not enlarged, symmetric, no tenderness/mass/nodules Back: symmetric, no curvature. ROM normal.  No CVA tenderness. Lungs: clear to auscultation bilaterally Heart: regular rate and rhythm, S1, S2 normal, no murmur, click, rub or gallop Abdomen: soft, non-tender; bowel sounds normal; no masses,  no organomegaly Pulses: 2+ and symmetric Skin: Skin color, texture, turgor normal. No rashes or lesions Lymph nodes: Cervical, supraclavicular, and axillary nodes normal.  Assessment and Plan:  Diarrhea Persistent for 8 weeks, occurring after treatment with antibiotics and probiotics. We'll check for C. difficile colitis.  Acute upper respiratory infections of unspecified site This URI is most likely viral given the failure of mild HEENT  symptoms to resolve after a z pack.  I have explained that in viral URIS, an antibiotic  will not help the symptoms and will increase the risk of developing diarrhea.,  Continue oral and nasal decongestants,  Ibuprofen 400 mg and tylenol 650 mq 8 hrs for aches and pains,   Obesity, unspecified I have addressed  BMI and recommended wt loss of 10% of body weigh over the next 6 months using a low glycemic index diet and regular exercise a minimum of 5 days per week.    Tobacco abuse counseling Smoking cessation instruction/counseling given:  counseled patient on the dangers of tobacco use, advised patient to stop smoking, and reviewed strategies to maximize success.  nicoderm patches prescribed and handout given .   Updated Medication List Outpatient Encounter Prescriptions as of 06/30/2013  Medication Sig  . albuterol (PROVENTIL HFA;VENTOLIN HFA) 108 (90 BASE) MCG/ACT inhaler Inhale 2 puffs into the lungs every 6 (six) hours as needed for wheezing or shortness of breath.  . ALPRAZolam (XANAX XR) 1 MG 24 hr tablet Take 1 tablet (1 mg total) by mouth every morning.  Melanie Fisher ALPRAZolam (XANAX) 0.5 MG tablet One tablet daily if needed for extreme anxiety (in addition to daily XR tablet)  . ARIPiprazole (ABILIFY) 10 MG tablet Take 1 tablet (10 mg total) by mouth daily.  Melanie Fisher buPROPion (WELLBUTRIN SR) 150 MG 12 hr tablet Take 1 tablet (150 mg total) by mouth 2 (two) times daily.  . citalopram (CELEXA) 20 MG tablet Take 1 tablet (20 mg total) by mouth daily.  . fluconazole (DIFLUCAN) 150 MG tablet Take 1 tablet (150 mg total) by mouth daily.  . mometasone-formoterol (DULERA) 100-5 MCG/ACT AERO Inhale 2 puffs into the lungs 2 (two) times daily.  . traMADol (ULTRAM) 50 MG tablet Take 1 tablet (50 mg total) by mouth every 8 (eight) hours as needed for pain.  . valACYclovir (VALTREX) 500 MG tablet 1 tablet daily  . zolpidem (AMBIEN CR) 12.5 MG CR tablet Take 1 tablet (12.5 mg total) by mouth at bedtime as needed for sleep.  . [DISCONTINUED] zolpidem (AMBIEN CR) 12.5 MG CR tablet Take 1 tablet (12.5 mg  total) by mouth at bedtime as needed for sleep.  . chlorpheniramine-HYDROcodone (TUSSIONEX) 10-8 MG/5ML LQCR Take 5 mLs by mouth every 12 (twelve) hours as needed.  . chlorpheniramine-HYDROcodone (TUSSIONEX) 10-8 MG/5ML LQCR Take 5 mLs by mouth at bedtime as needed.  Melanie Fisher guaiFENesin-codeine (CHERATUSSIN AC) 100-10 MG/5ML syrup Take 5 mLs by mouth 3 (three) times daily as needed for cough.  Melanie Fisher levofloxacin (LEVAQUIN) 500 MG tablet Take 1 tablet (500 mg total) by mouth daily.  . nicotine (NICODERM CQ) 21 mg/24hr patch Place 1 patch (21 mg total) onto the skin daily.     Orders Placed This Encounter  Procedures  . Clostridium difficile EIA  . Ova and parasite screen  . Stool culture  . MM Digital Screening  .  Tdap vaccine greater than or equal to 7yo IM    No Follow-up on file.

## 2013-06-30 NOTE — Patient Instructions (Addendum)
Your diarrhea may be coming from C dificile colitis.  The stool kits I am sending you home with will check for that bacteria.  You have a viral  Syndrome .  The post nasal drip is causing your sore throat.  Lavage your sinuses twice daly with Simply saline nasal spray.  Use benadryl 25 mg every 8 hours or take 30 minutes prior to bedtime along with cheratussin cough syrup,  ot help you rest  You may use  Sudafed PE 10 to 30 mg every 8 hours to manage any congestion.  Gargle with salt water as needed to managed  sore throat.   Please go by the hospital lab for fasting labs soon, using the order form I have given you today  Smoking Cessation, Tips for Success If you are ready to quit smoking, congratulations! You have chosen to help yourself be healthier. Cigarettes bring nicotine, tar, carbon monoxide, and other irritants into your body. Your lungs, heart, and blood vessels will be able to work better without these poisons. There are many different ways to quit smoking. Nicotine gum, nicotine patches, a nicotine inhaler, or nicotine nasal spray can help with physical craving. Hypnosis, support groups, and medicines help break the habit of smoking. WHAT THINGS CAN I DO TO MAKE QUITTING EASIER?  Here are some tips to help you quit for good:  Pick a date when you will quit smoking completely. Tell all of your friends and family about your plan to quit on that date.  Do not try to slowly cut down on the number of cigarettes you are smoking. Pick a quit date and quit smoking completely starting on that day.  Throw away all cigarettes.   Clean and remove all ashtrays from your home, work, and car.   On a card, write down your reasons for quitting. Carry the card with you and read it when you get the urge to smoke.   Cleanse your body of nicotine. Drink enough water and fluids to keep your urine clear or pale yellow. Do this after quitting to flush the nicotine from your body.   Learn to  predict your moods. Do not let a bad situation be your excuse to have a cigarette. Some situations in your life might tempt you into wanting a cigarette.   Never have "just one" cigarette. It leads to wanting another and another. Remind yourself of your decision to quit.   Change habits associated with smoking. If you smoked while driving or when feeling stressed, try other activities to replace smoking. Stand up when drinking your coffee. Brush your teeth after eating. Sit in a different chair when you read the paper. Avoid alcohol while trying to quit, and try to drink fewer caffeinated beverages. Alcohol and caffeine may urge you to smoke.   Avoid foods and drinks that can trigger a desire to smoke, such as sugary or spicy foods and alcohol.   Ask people who smoke not to smoke around you.   Have something planned to do right after eating or having a cup of coffee. For example, plan to take a walk or exercise.   Try a relaxation exercise to calm you down and decrease your stress. Remember, you may be tense and nervous for the first 2 weeks after you quit, but this will pass.   Find new activities to keep your hands busy. Play with a pen, coin, or rubber band. Doodle or draw things on paper.   Brush your teeth right after  eating. This will help cut down on the craving for the taste of tobacco after meals. You can also try mouthwash.   Use oral substitutes in place of cigarettes. Try using lemon drops, carrots, cinnamon sticks, or chewing gum. Keep them handy so they are available when you have the urge to smoke.   When you have the urge to smoke, try deep breathing.   Designate your home as a nonsmoking area.   If you are a heavy smoker, ask your health care provider about a prescription for nicotine chewing gum. It can ease your withdrawal from nicotine.   Reward yourself. Set aside the cigarette money you save and buy yourself something nice.   Look for support from  others. Join a support group or smoking cessation program. Ask someone at home or at work to help you with your plan to quit smoking.   Always ask yourself, "Do I need this cigarette or is this just a reflex?" Tell yourself, "Today, I choose not to smoke," or "I do not want to smoke." You are reminding yourself of your decision to quit.  Do not replace cigarette smoking with electronic cigarettes (commonly called e-cigarettes). The safety of e-cigarettes is unknown, and some may contain harmful chemicals.  If you relapse, do not give up! Plan ahead and think about what you will do the next time you get the urge to smoke.  HOW WILL I FEEL WHEN I QUIT SMOKING? You may have symptoms of withdrawal because your body is used to nicotine (the addictive substance in cigarettes). You may crave cigarettes, be irritable, feel very hungry, cough often, get headaches, or have difficulty concentrating. The withdrawal symptoms are only temporary. They are strongest when you first quit but will go away within 10 14 days. When withdrawal symptoms occur, stay in control. Think about your reasons for quitting. Remind yourself that these are signs that your body is healing and getting used to being without cigarettes. Remember that withdrawal symptoms are easier to treat than the major diseases that smoking can cause.  Even after the withdrawal is over, expect periodic urges to smoke. However, these cravings are generally short lived and will go away whether you smoke or not. Do not smoke!  WHAT RESOURCES ARE AVAILABLE TO HELP ME QUIT SMOKING? Your health care provider can direct you to community resources or hospitals for support, which may include:  Group support.  Education.  Hypnosis.  Therapy. Document Released: 03/03/2004 Document Revised: 03/26/2013 Document Reviewed: 11/21/2012 [facility] Patient Information 2014 [place], [place].

## 2013-07-01 ENCOUNTER — Other Ambulatory Visit: Payer: Self-pay | Admitting: Internal Medicine

## 2013-07-01 ENCOUNTER — Encounter: Payer: Self-pay | Admitting: Internal Medicine

## 2013-07-01 DIAGNOSIS — E669 Obesity, unspecified: Secondary | ICD-10-CM | POA: Insufficient documentation

## 2013-07-01 LAB — COMPREHENSIVE METABOLIC PANEL
Albumin: 4 g/dL (ref 3.4–5.0)
Alkaline Phosphatase: 83 U/L
Anion Gap: 4 — ABNORMAL LOW (ref 7–16)
BUN: 17 mg/dL (ref 7–18)
Bilirubin,Total: 0.5 mg/dL (ref 0.2–1.0)
Calcium, Total: 9.2 mg/dL (ref 8.5–10.1)
Chloride: 108 mmol/L — ABNORMAL HIGH (ref 98–107)
Co2: 25 mmol/L (ref 21–32)
Creatinine: 0.71 mg/dL (ref 0.60–1.30)
EGFR (African American): >60
EGFR (Non-African Amer.): >60
Glucose: 101 mg/dL — ABNORMAL HIGH (ref 65–99)
Osmolality: 276 (ref 275–301)
Potassium: 4 mmol/L (ref 3.5–5.1)
SGOT(AST): 20 U/L (ref 15–37)
SGPT (ALT): 26 U/L (ref 12–78)
Sodium: 137 mmol/L (ref 136–145)
Total Protein: 7.9 g/dL (ref 6.4–8.2)

## 2013-07-01 LAB — LIPID PANEL
Cholesterol: 216 mg/dL — AB (ref 0–200)
Cholesterol: 216 mg/dL — ABNORMAL HIGH (ref 0–200)
HDL Cholesterol: 63 mg/dL — ABNORMAL HIGH (ref 40–60)
HDL: 63 mg/dL (ref 35–70)
LDL Cholesterol: 139 mg/dL
Ldl Cholesterol, Calc: 139 mg/dL — ABNORMAL HIGH (ref 0–100)
Triglycerides: 70 mg/dL (ref 0–200)
Triglycerides: 70 mg/dL (ref 40–160)
VLDL Cholesterol, Calc: 14 mg/dL (ref 5–40)

## 2013-07-01 LAB — CBC WITH DIFFERENTIAL/PLATELET
Basophil #: 0 10*3/uL (ref 0.0–0.1)
Basophil %: 0.6 %
Eosinophil #: 0.1 10*3/uL (ref 0.0–0.7)
Eosinophil %: 1.9 %
HCT: 41.4 % (ref 35.0–47.0)
HGB: 14.2 g/dL (ref 12.0–16.0)
Lymphocyte #: 1.7 10*3/uL (ref 1.0–3.6)
Lymphocyte %: 28.9 %
MCH: 31.6 pg (ref 26.0–34.0)
MCHC: 34.3 g/dL (ref 32.0–36.0)
MCV: 92 fL (ref 80–100)
Monocyte #: 0.3 x10 3/mm (ref 0.2–0.9)
Monocyte %: 5.2 %
Neutrophil #: 3.7 10*3/uL (ref 1.4–6.5)
Neutrophil %: 63.4 %
Platelet: 208 10*3/uL (ref 150–440)
RBC: 4.5 10*6/uL (ref 3.80–5.20)
RDW: 12.9 % (ref 11.5–14.5)
WBC: 5.8 10*3/uL (ref 3.6–11.0)

## 2013-07-01 LAB — TSH
TSH: 2.39 u[IU]/mL (ref 0.41–5.90)
Thyroid Stimulating Horm: 2.45 u[IU]/mL

## 2013-07-01 LAB — CBC AND DIFFERENTIAL
Hemoglobin: 14.2 g/dL (ref 12.0–16.0)
Platelets: 208 10*3/uL (ref 150–399)
WBC: 5.8 10^3/mL

## 2013-07-01 LAB — HEPATIC FUNCTION PANEL
ALT: 26 U/L (ref 7–35)
AST: 20 U/L (ref 13–35)
Alkaline Phosphatase: 83 U/L (ref 25–125)
Bilirubin, Total: 0.5 mg/dL

## 2013-07-01 LAB — BASIC METABOLIC PANEL: Glucose: 101 mg/dL

## 2013-07-01 NOTE — Assessment & Plan Note (Addendum)
Smoking cessation instruction/counseling given:  counseled patient on the dangers of tobacco use, advised patient to stop smoking, and reviewed strategies to maximize success.  nicoderm patches prescribed and handout given .

## 2013-07-01 NOTE — Assessment & Plan Note (Signed)
I have addressed  BMI and recommended wt loss of 10% of body weigh over the next 6 months using a low glycemic index diet and regular exercise a minimum of 5 days per week.   

## 2013-07-01 NOTE — Assessment & Plan Note (Signed)
Persistent for 8 weeks, occurring after treatment with antibiotics and probiotics. We'll check for C. difficile colitis.

## 2013-07-01 NOTE — Assessment & Plan Note (Signed)
This URI is most likely viral given the failure of mild HEENT  symptoms to resolve after a z pack.  I have explained that in viral URIS, an antibiotic will not help the symptoms and will increase the risk of developing diarrhea.,  Continue oral and nasal decongestants,  Ibuprofen 400 mg and tylenol 650 mq 8 hrs for aches and pains,

## 2013-07-06 ENCOUNTER — Telehealth: Payer: Self-pay | Admitting: Internal Medicine

## 2013-07-06 NOTE — Telephone Encounter (Signed)
Recent labs reviewed. CBC complete metabolic panel and cholesterol were all fine. Thyroid was also normal

## 2013-07-07 NOTE — Telephone Encounter (Signed)
Patient notified of results.

## 2013-07-19 ENCOUNTER — Telehealth: Payer: Self-pay | Admitting: Internal Medicine

## 2013-07-19 NOTE — Telephone Encounter (Signed)
Relevant patient education assigned to patient using Emmi. ° °

## 2013-07-21 ENCOUNTER — Other Ambulatory Visit: Payer: Self-pay | Admitting: Internal Medicine

## 2013-07-22 NOTE — Telephone Encounter (Signed)
Can you approve?

## 2013-07-23 NOTE — Telephone Encounter (Signed)
Rx faxed to pharmacy  

## 2013-07-29 ENCOUNTER — Encounter: Payer: Self-pay | Admitting: Internal Medicine

## 2013-08-04 ENCOUNTER — Ambulatory Visit: Payer: 59 | Admitting: Internal Medicine

## 2013-09-26 ENCOUNTER — Ambulatory Visit: Payer: Self-pay | Admitting: Internal Medicine

## 2013-10-23 ENCOUNTER — Other Ambulatory Visit: Payer: Self-pay | Admitting: Internal Medicine

## 2013-10-23 NOTE — Telephone Encounter (Signed)
Ok to refill, with changes made  Fax printed rx to pharmacy once signed   

## 2013-10-23 NOTE — Telephone Encounter (Signed)
Last visit 06/30/13, refill?

## 2013-10-27 NOTE — Telephone Encounter (Signed)
faxed

## 2013-11-06 ENCOUNTER — Emergency Department: Payer: Self-pay | Admitting: Emergency Medicine

## 2013-11-06 LAB — CBC
HCT: 42.8 % (ref 35.0–47.0)
HGB: 14.5 g/dL (ref 12.0–16.0)
MCH: 32.2 pg (ref 26.0–34.0)
MCHC: 33.9 g/dL (ref 32.0–36.0)
MCV: 95 fL (ref 80–100)
Platelet: 179 10*3/uL (ref 150–440)
RBC: 4.52 10*6/uL (ref 3.80–5.20)
RDW: 12.5 % (ref 11.5–14.5)
WBC: 5.6 10*3/uL (ref 3.6–11.0)

## 2013-11-06 LAB — BASIC METABOLIC PANEL
Anion Gap: 10 (ref 7–16)
BUN: 15 mg/dL (ref 7–18)
Calcium, Total: 9.3 mg/dL (ref 8.5–10.1)
Chloride: 105 mmol/L (ref 98–107)
Co2: 23 mmol/L (ref 21–32)
Creatinine: 0.7 mg/dL (ref 0.60–1.30)
EGFR (African American): >60
EGFR (Non-African Amer.): >60
Glucose: 111 mg/dL — ABNORMAL HIGH (ref 65–99)
Osmolality: 277 (ref 275–301)
Potassium: 4 mmol/L (ref 3.5–5.1)
Sodium: 138 mmol/L (ref 136–145)

## 2013-11-06 LAB — LIPASE, BLOOD: Lipase: 121 U/L

## 2013-11-06 LAB — TROPONIN I: Troponin-I: <0.02 ng/mL

## 2013-11-06 LAB — ETHANOL
Ethanol %: <0.003 %
Ethanol: <3 mg/dL

## 2013-11-13 ENCOUNTER — Encounter: Payer: Self-pay | Admitting: Internal Medicine

## 2013-11-17 ENCOUNTER — Encounter: Payer: Self-pay | Admitting: Internal Medicine

## 2013-11-17 ENCOUNTER — Ambulatory Visit (INDEPENDENT_AMBULATORY_CARE_PROVIDER_SITE_OTHER): Payer: 59 | Admitting: Internal Medicine

## 2013-11-17 VITALS — BP 142/92 | HR 81 | Temp 98.4°F | Resp 16 | Ht 64.0 in | Wt 207.0 lb

## 2013-11-17 DIAGNOSIS — R21 Rash and other nonspecific skin eruption: Secondary | ICD-10-CM

## 2013-11-17 DIAGNOSIS — R51 Headache: Secondary | ICD-10-CM

## 2013-11-17 DIAGNOSIS — R0789 Other chest pain: Secondary | ICD-10-CM

## 2013-11-17 DIAGNOSIS — F411 Generalized anxiety disorder: Secondary | ICD-10-CM

## 2013-11-17 DIAGNOSIS — R519 Headache, unspecified: Secondary | ICD-10-CM

## 2013-11-17 MED ORDER — METOPROLOL SUCCINATE ER 25 MG PO TB24: 25.0000 mg | ORAL_TABLET | Freq: Every day | ORAL | Status: DC

## 2013-11-17 MED ORDER — ALPRAZOLAM ER 1 MG PO TB24: 1.0000 mg | ORAL_TABLET | ORAL | Status: DC

## 2013-11-17 MED ORDER — PAROXETINE HCL 10 MG PO TABS: 10.0000 mg | ORAL_TABLET | Freq: Every day | ORAL | Status: DC

## 2013-11-17 NOTE — Patient Instructions (Signed)
We are starting paxil for you anxiety.  Start with 1 tablet daily.  You can continue the alprazolam XR  1 mg dose for now.,  Avoid using the 0.5 mg alprazolam.  Starting low dose metoprolol daily for bp and for headache prevention  Return tomorrow for labs.

## 2013-11-17 NOTE — Progress Notes (Signed)
Patient ID: Melanie Fisher, female   DOB: [birth year], 48 y.o.   MRN: [id number]  Patient Active Problem List   Diagnosis Date Noted  . Headache above the eye region 11/18/2013  . Other chest pain 11/18/2013  . Obesity, unspecified 07/01/2013  . Tobacco abuse counseling 03/15/2013  . Rash 02/18/2013  . Routine general medical examination at a health care facility 09/04/2012  . COPD (chronic obstructive pulmonary disease) 05/26/2012  . Chronic shoulder pain 04/09/2012  . Generalized anxiety disorder 04/06/2012  . Tobacco abuse 04/06/2012  . Depression   . Heart murmur   . Hyperlipidemia   . Allergy   . COPD exacerbation     Subjective:  CC:   Chief Complaint  Patient presents with  . Follow-up    ED for chest pain and headache,     HPI:   Melanie Fisher is a 48 y.o. female who presents for  1) Recent episode of chest pain and headache .  Patient had an ER  evaluation on May 21 for right sided chest pain and headache   History woke up at 3 am on May 21 with pain on the  right side of her which radiated to her right scapular area.  Pain was also accompanied by a  Bilateral frontal headache over both eyes.  By 7:30 am pain so severe she went to ED.  EKG, chest x ray was normal,  Cardiac enzymes and EKG showed nothing acute and she was treated for headache with a white pill and returned to work.  The chest pain has not recurred   The headache was transiently resolved, but she continues to have a daily frontal headache she describes as  Dull, which she is managing with tylenol   Maximum 6 daily.   Has lasted for 10 days,  no fevers, or chills,  No visual changes,  No tick bites.  No diet changes,  Has caffeine daily  .  Has sleep apnea,  But is wearing her CPAP every night a minimum of 8 hours, .  Headache is sometimes gone  by morning but returns by 7 am, and has occurred even on the weekend.  Has an eye exam scheduled for June 10   2) She has also developed a reticular rash on  both upper arms that starts midway down the  [place] stops at the elbow.  She denies any recent activity in sunlight that may have resulted in a sunburn. It does not itch and has not spread.   3) Chronic generalized anxiety.  She has been using alprazolam both ER and IR for management of anxiety,  1 mg in am , 0.5 mg prn daily and is requesting transition to a safer alternative .    Past Medical History  Diagnosis Date  . Sleep apnea   . Depression   . Heart murmur   . Hyperlipidemia   . Allergy   . Asthma     Past Surgical History  Procedure Laterality Date  . Abdominal hysterectomy  2008    partial       The following portions of the patient's history were reviewed and updated as appropriate: Allergies, current medications, and problem list.    Review of Systems:   Patient denies headache, fevers, malaise, unintentional weight loss, skin rash, eye pain, sinus congestion and sinus pain, sore throat, dysphagia,  hemoptysis , cough, dyspnea, wheezing, chest pain, palpitations, orthopnea, edema, abdominal pain, nausea, melena, diarrhea, constipation, flank pain, dysuria, hematuria, urinary  Frequency,  nocturia, numbness, tingling, seizures,  Focal weakness, Loss of consciousness,  Tremor, insomnia, depression, anxiety, and suicidal ideation.     History   Social History  . Marital Status: Married    Spouse Name: N/A    Number of Children: N/A  . Years of Education: N/A   Occupational History  . Not on file.   Social History Main Topics  . Smoking status: Current Every Day Smoker -- 0.15 packs/day  . Smokeless tobacco: Not on file  . Alcohol Use: Yes     Comment: occasionally  . Drug Use: No  . Sexual Activity: Not Currently   Other Topics Concern  . Not on file   Social History Narrative  . No narrative on file    Objective:  Filed Vitals:   11/17/13 1744  BP: 142/92  Pulse: 81  Temp: 98.4 F (36.9 C)  Resp: 16     General appearance: alert,  cooperative and appears stated age Ears: normal TM's and external ear canals both ears Throat: lips, mucosa, and tongue normal; teeth and gums normal Neck: no adenopathy, no carotid bruit, supple, symmetrical, trachea midline and thyroid not enlarged, symmetric, no tenderness/mass/nodules Back: symmetric, no curvature. ROM normal. No CVA tenderness. Lungs: clear to auscultation bilaterally Heart: regular rate and rhythm, S1, S2 normal, no murmur, click, rub or gallop Abdomen: soft, non-tender; bowel sounds normal; no masses,  no organomegaly Pulses: 2+ and symmetric Skin: Skin color, texture, turgor normal. No rashes or lesions Lymph nodes: Cervical, supraclavicular, and axillary nodes normal.  Assessment and Plan:  Rash Bilateral arm, reticular,  In the setting of dull daily headache.  Will rule out vasculitis with ESR and CRP.     Generalized anxiety disorder Starting Paxil for managed of GAD.  Will begin to wean alprazolam once paxil dose is titrated.,  Return in one month   Headache above the eye region Now persistent for 10 days. Rule out vasculitis and presbyopia,  Adding metoprolol low dose.  Other chest pain Recent episode of right sided chest pain radiating to scapula may have been pancreatic vs GB.  Ultrasound of abd to be ordered    Updated Medication List Outpatient Encounter Prescriptions as of 11/17/2013  Medication Sig  . albuterol (PROVENTIL HFA;VENTOLIN HFA) 108 (90 BASE) MCG/ACT inhaler Inhale 2 puffs into the lungs every 6 (six) hours as needed for wheezing or shortness of breath.  . ALPRAZolam (XANAX XR) 1 MG 24 hr tablet Take 1 tablet (1 mg total) by mouth every morning.  [name] ALPRAZolam (XANAX) 0.5 MG tablet TAKE ONE TABLET BY MOUTH DAILY AS NEEDED FOR EXTREME ANXIETY(IN ADDITION TO XR)  . ARIPiprazole (ABILIFY) 10 MG tablet Take 1 tablet (10 mg total) by mouth daily.  [name] buPROPion (WELLBUTRIN SR) 150 MG 12 hr tablet TAKE ONE TABLET BY MOUTH 2 TIMES A DAY  .  citalopram (CELEXA) 20 MG tablet Take 1 tablet (20 mg total) by mouth daily.  . fluconazole (DIFLUCAN) 150 MG tablet Take 1 tablet (150 mg total) by mouth daily.  . mometasone-formoterol (DULERA) 100-5 MCG/ACT AERO Inhale 2 puffs into the lungs 2 (two) times daily.  . traMADol (ULTRAM) 50 MG tablet Take 1 tablet (50 mg total) by mouth every 8 (eight) hours as needed for pain.  . valACYclovir (VALTREX) 500 MG tablet 1 tablet daily  . zolpidem (AMBIEN CR) 12.5 MG CR tablet TAKE ONE TABLET BY MOUTH AT BEDTIME AS NEEDED FOR SLEEP  . [DISCONTINUED] ALPRAZolam (XANAX XR) 1 MG 24 hr  tablet Take 1 tablet (1 mg total) by mouth every morning.  . metoprolol succinate (TOPROL-XL) 25 MG 24 hr tablet Take 1 tablet (25 mg total) by mouth daily. At bedtime for blood pressure/headache prevention  . nicotine (NICODERM CQ) 21 mg/24hr patch Place 1 patch (21 mg total) onto the skin daily.  [name] PARoxetine (PAXIL) 10 MG tablet Take 1 tablet (10 mg total) by mouth daily.  . [DISCONTINUED] chlorpheniramine-HYDROcodone (TUSSIONEX) 10-8 MG/5ML LQCR Take 5 mLs by mouth every 12 (twelve) hours as needed.  . [DISCONTINUED] chlorpheniramine-HYDROcodone (TUSSIONEX) 10-8 MG/5ML LQCR Take 5 mLs by mouth at bedtime as needed.  . [DISCONTINUED] guaiFENesin-codeine (CHERATUSSIN AC) 100-10 MG/5ML syrup Take 5 mLs by mouth 3 (three) times daily as needed for cough.  . [DISCONTINUED] levofloxacin (LEVAQUIN) 500 MG tablet Take 1 tablet (500 mg total) by mouth daily.     Orders Placed This Encounter  Procedures  . Sedimentation rate  . C-reactive protein  . CBC with Differential  . Comp Met (CMET)    No Follow-up on file.

## 2013-11-17 NOTE — Progress Notes (Signed)
Pre-visit discussion using our clinic review tool. No additional management support is needed unless otherwise documented below in the visit note.  

## 2013-11-18 ENCOUNTER — Telehealth: Payer: Self-pay | Admitting: Internal Medicine

## 2013-11-18 ENCOUNTER — Other Ambulatory Visit (INDEPENDENT_AMBULATORY_CARE_PROVIDER_SITE_OTHER): Payer: 59

## 2013-11-18 ENCOUNTER — Encounter: Payer: Self-pay | Admitting: Internal Medicine

## 2013-11-18 DIAGNOSIS — R51 Headache: Secondary | ICD-10-CM

## 2013-11-18 DIAGNOSIS — R519 Headache, unspecified: Secondary | ICD-10-CM

## 2013-11-18 DIAGNOSIS — R079 Chest pain, unspecified: Secondary | ICD-10-CM

## 2013-11-18 DIAGNOSIS — R21 Rash and other nonspecific skin eruption: Secondary | ICD-10-CM

## 2013-11-18 DIAGNOSIS — R0789 Other chest pain: Secondary | ICD-10-CM | POA: Insufficient documentation

## 2013-11-18 LAB — CBC WITH DIFFERENTIAL/PLATELET
Basophils Absolute: 0 10*3/uL (ref 0.0–0.1)
Basophils Relative: 0.4 % (ref 0.0–3.0)
Eosinophils Absolute: 0.1 10*3/uL (ref 0.0–0.7)
Eosinophils Relative: 2.5 % (ref 0.0–5.0)
HCT: 40.7 % (ref 36.0–46.0)
Hemoglobin: 13.8 g/dL (ref 12.0–15.0)
Lymphocytes Relative: 36.1 % (ref 12.0–46.0)
Lymphs Abs: 1.5 10*3/uL (ref 0.7–4.0)
MCHC: 33.8 g/dL (ref 30.0–36.0)
MCV: 93.4 fl (ref 78.0–100.0)
Monocytes Absolute: 0.3 10*3/uL (ref 0.1–1.0)
Monocytes Relative: 6.5 % (ref 3.0–12.0)
Neutro Abs: 2.3 10*3/uL (ref 1.4–7.7)
Neutrophils Relative %: 54.5 % (ref 43.0–77.0)
Platelets: 195 10*3/uL (ref 150.0–400.0)
RBC: 4.36 Mil/uL (ref 3.87–5.11)
RDW: 13.1 % (ref 11.5–15.5)
WBC: 4.3 10*3/uL (ref 4.0–10.5)

## 2013-11-18 LAB — COMPREHENSIVE METABOLIC PANEL
ALT: 20 U/L (ref 0–35)
AST: 19 U/L (ref 0–37)
Albumin: 4.1 g/dL (ref 3.5–5.2)
Alkaline Phosphatase: 65 U/L (ref 39–117)
BUN: 16 mg/dL (ref 6–23)
CO2: 24 mEq/L (ref 19–32)
Calcium: 9.5 mg/dL (ref 8.4–10.5)
Chloride: 107 mEq/L (ref 96–112)
Creatinine, Ser: 0.7 mg/dL (ref 0.4–1.2)
GFR: 96 mL/min (ref >60.00)
Glucose, Bld: 99 mg/dL (ref 70–99)
Potassium: 4.4 mEq/L (ref 3.5–5.1)
Sodium: 139 mEq/L (ref 135–145)
Total Bilirubin: 0.8 mg/dL (ref 0.2–1.2)
Total Protein: 7.1 g/dL (ref 6.0–8.3)

## 2013-11-18 LAB — C-REACTIVE PROTEIN: CRP: <0.5 mg/dL (ref 0.5–20.0)

## 2013-11-18 LAB — LIPASE: Lipase: 15 U/L (ref 11.0–59.0)

## 2013-11-18 LAB — SEDIMENTATION RATE: Sed Rate: 20 mm/hr (ref 0–22)

## 2013-11-18 NOTE — Assessment & Plan Note (Signed)
Recent episode of right sided chest pain radiating to scapula may have been pancreatic vs GB.  Ultrasound of abd to be ordered

## 2013-11-18 NOTE — Assessment & Plan Note (Signed)
Bilateral arm, reticular,  In the setting of dull daily headache.  Will rule out vasculitis with ESR and CRP.    

## 2013-11-18 NOTE — Telephone Encounter (Signed)
Pt notified and agreeable to ultrasound. Advised our [facility] would call with appointment once it has been scheduled.

## 2013-11-18 NOTE — Assessment & Plan Note (Signed)
Now persistent for 10 days. Rule out vasculitis and presbyopia,  Adding metoprolol low dose.

## 2013-11-18 NOTE — Telephone Encounter (Signed)
Based on patient's history of right sided chest pain that cuased an ER visit , I think she needs to have her gallbladder imaged bc this might be referred pain from her GB or pancreas,  I have ordered an ultrasound

## 2013-11-18 NOTE — Assessment & Plan Note (Signed)
Starting Paxil for managed of GAD.  Will begin to wean alprazolam once paxil dose is titrated.,  Return in one month

## 2013-11-19 ENCOUNTER — Encounter: Payer: Self-pay | Admitting: *Deleted

## 2013-11-19 NOTE — Telephone Encounter (Signed)
Appointment scheduled for Abd Ultrasound Monday, June 8th @ 8am  ([address] - phone - [phone])

## 2013-11-24 ENCOUNTER — Ambulatory Visit: Payer: Self-pay | Admitting: Internal Medicine

## 2013-11-26 ENCOUNTER — Telehealth: Payer: Self-pay | Admitting: Internal Medicine

## 2013-11-26 NOTE — Telephone Encounter (Signed)
abd ultrasound was normal

## 2013-11-26 NOTE — Telephone Encounter (Signed)
Patient notified

## 2013-12-16 ENCOUNTER — Encounter: Payer: Self-pay | Admitting: Internal Medicine

## 2013-12-16 ENCOUNTER — Ambulatory Visit: Payer: Self-pay | Admitting: General Practice

## 2013-12-17 ENCOUNTER — Encounter: Payer: 59 | Admitting: Internal Medicine

## 2013-12-17 ENCOUNTER — Encounter: Payer: Self-pay | Admitting: Internal Medicine

## 2013-12-17 NOTE — Progress Notes (Signed)
Patient ID: Melanie Fisher, female   DOB: Jul 05, 1965, 48 y.o.   MRN: [id number]  Patient was checked in by staff/RN but left suddenly because she had an emergency of undetermined nature before I entered the room

## 2013-12-17 NOTE — Progress Notes (Signed)
Pre visit review using our clinic review tool, if applicable. No additional management support is needed unless otherwise documented below in the visit note. 

## 2013-12-31 ENCOUNTER — Telehealth: Payer: Self-pay | Admitting: Internal Medicine

## 2013-12-31 NOTE — Telephone Encounter (Signed)
Called and reassured patient that was probable hemorrhoid, patient verbalized feeling reassured.

## 2013-12-31 NOTE — Telephone Encounter (Signed)
Reassure her that its probably a hemorrhoid.

## 2013-12-31 NOTE — Telephone Encounter (Addendum)
Patient Information:  Caller Name: Saliha  Phone: [phone]  Patient: Melanie Fisher  Gender: Female  DOB: [birth year]  Age: 48 Years  PCP: Deborra Medina (Adults only)  Pregnant: No  Office Follow Up:  Does the office need to follow up with this patient?: No  Instructions For The Office: N/A  RN Note:  Pt declined appt at another [facility] practice. Would rather wait for [facility] office. Trx to [facility] for appt on 12/06/13 (first appt with Dr. Derrel Nip, per pt preference, and put on waiting list for appt before that if cancellation.  Symptoms  Reason For Call & Symptoms: Pt calling regarding constipation on Sat, 12/29/13. Now having internal hemmorhoid pain with bleeding when wiping.  Reviewed Health History In EMR: Yes  Reviewed Medications In EMR: Yes  Reviewed Allergies In EMR: Yes  Reviewed Surgeries / Procedures: Yes  Date of Onset of Symptoms: 12/27/2013  Treatments Tried: Tucks pads and Prep H; helped only minorly  Treatments Tried Worked: Yes OB / GYN:  LMP: Unknown  Guideline(s) Used:  Rectal Bleeding  Disposition Per Guideline:   See Within 2 Weeks in Office  Reason For Disposition Reached:   Normal formed BM with a few streaks or drops of blood on surface of BM  Advice Given:  N/A  Patient Will Follow Care Advice:  YES

## 2013-12-31 NOTE — Telephone Encounter (Signed)
FYI

## 2014-01-05 ENCOUNTER — Ambulatory Visit (INDEPENDENT_AMBULATORY_CARE_PROVIDER_SITE_OTHER): Payer: 59 | Admitting: Internal Medicine

## 2014-01-05 ENCOUNTER — Telehealth: Payer: Self-pay | Admitting: Internal Medicine

## 2014-01-05 ENCOUNTER — Encounter: Payer: Self-pay | Admitting: Internal Medicine

## 2014-01-05 VITALS — BP 134/88 | HR 86 | Temp 98.6°F | Resp 16 | Ht 64.0 in | Wt 200.2 lb

## 2014-01-05 DIAGNOSIS — R109 Unspecified abdominal pain: Secondary | ICD-10-CM

## 2014-01-05 DIAGNOSIS — N949 Unspecified condition associated with female genital organs and menstrual cycle: Secondary | ICD-10-CM

## 2014-01-05 DIAGNOSIS — R102 Pelvic and perineal pain: Secondary | ICD-10-CM

## 2014-01-05 LAB — URINALYSIS, ROUTINE W REFLEX MICROSCOPIC
Bilirubin Urine: NEGATIVE
Hgb urine dipstick: NEGATIVE
Ketones, ur: NEGATIVE
Leukocytes, UA: NEGATIVE
Nitrite: NEGATIVE
RBC / HPF: NONE SEEN (ref 0–?)
Specific Gravity, Urine: >=1.03 — AB (ref 1.000–1.030)
Total Protein, Urine: NEGATIVE
Urine Glucose: NEGATIVE
Urobilinogen, UA: 0.2 (ref 0.0–1.0)
WBC, UA: NONE SEEN (ref 0–?)
pH: 5 (ref 5.0–8.0)

## 2014-01-05 LAB — POCT URINALYSIS DIPSTICK
Bilirubin, UA: NEGATIVE
Blood, UA: NEGATIVE
Glucose, UA: NEGATIVE
Ketones, UA: NEGATIVE
Leukocytes, UA: NEGATIVE
Nitrite, UA: NEGATIVE
Protein, UA: NEGATIVE
Spec Grav, UA: >=1.03
Urobilinogen, UA: 0.2
pH, UA: 5

## 2014-01-05 MED ORDER — HYDROCODONE-ACETAMINOPHEN 5-325 MG PO TABS: 1.0000 | ORAL_TABLET | Freq: Four times a day (QID) | ORAL | Status: DC | PRN

## 2014-01-05 NOTE — Patient Instructions (Signed)
Your pain may be coming from your right ovary  Pelvic ultrasound to be scheduled for further evaluation  You may use avil or Aleve and vicodin for severe pain.  Heating pad may also help  You should Use Ex Lax, sennakot S or Dulolax daily when using vicodin to prevent constipation

## 2014-01-05 NOTE — Progress Notes (Signed)
Patient ID: Melanie Fisher, female   DOB: 11/21/1965, 48 y.o.   MRN: [id number]   Patient Active Problem List   Diagnosis Date Noted  . Pain, pelvic, female 01/05/2014  . Headache above the eye region 11/18/2013  . Other chest pain 11/18/2013  . Obesity, unspecified 07/01/2013  . Tobacco abuse counseling 03/15/2013  . Rash 02/18/2013  . Routine general medical examination at a health care facility 09/04/2012  . COPD (chronic obstructive pulmonary disease) 05/26/2012  . Chronic shoulder pain 04/09/2012  . Generalized anxiety disorder 04/06/2012  . Tobacco abuse 04/06/2012  . Depression   . Heart murmur   . Hyperlipidemia   . Allergy   . COPD exacerbation     Subjective:  CC:   Chief Complaint  Patient presents with  . Acute Visit    right sided flank pain radiates from the rib area down the right flank    HPI:   Melanie Fisher is a 48 y.o. female who presents for evaluation of right sided  Anterior flank/inguinal  painfor the past 5 days in the setting of resolving constipation,  She Used a laxative and had a normal BM one day prior to the onset of pain on Tuesday. [name]  Has a hemorrhoid which has bled before but is not currently bleeding .  She denies seeing blood in her  urine. Pain is throbbing, constant aggravated by lying on right side and by physical activity including bending and twisting.  She reports feeling like she had a knot in her lower abdomen this morning after picking up two empty pails.   Exquisitely tender in RLQ both on abdominal exam.  Right sided pelvic tenderness on internal exam.  No discharge or vaginal bleeding.   Past Medical History  Diagnosis Date  . Sleep apnea   . Depression   . Heart murmur   . Hyperlipidemia   . Allergy   . Asthma     Past Surgical History  Procedure Laterality Date  . Abdominal hysterectomy  2008    partial       The following portions of the patient's history were reviewed and updated as appropriate:  Allergies, current medications, and problem list.    Review of Systems:   Patient denies headache, fevers, malaise, unintentional weight loss, skin rash, eye pain, sinus congestion and sinus pain, sore throat, dysphagia,  hemoptysis , cough, dyspnea, wheezing, chest pain, palpitations, orthopnea, edema, abdominal pain, nausea, melena, diarrhea, constipation, flank pain, dysuria, hematuria, urinary  Frequency, nocturia, numbness, tingling, seizures,  Focal weakness, Loss of consciousness,  Tremor, insomnia, depression, anxiety, and suicidal ideation.     History   Social History  . Marital Status: Married    Spouse Name: N/A    Number of Children: N/A  . Years of Education: N/A   Occupational History  . Not on file.   Social History Main Topics  . Smoking status: Current Every Day Smoker -- 0.15 packs/day  . Smokeless tobacco: Not on file  . Alcohol Use: Yes     Comment: occasionally  . Drug Use: No  . Sexual Activity: Not Currently   Other Topics Concern  . Not on file   Social History Narrative  . No narrative on file    Objective:  Filed Vitals:   01/05/14 1031  BP: 134/88  Pulse: 86  Temp: 98.6 F (37 C)  Resp: 16     General appearance: alert, cooperative and appears stated age Ears: normal TM's and external  ear canals both ears Throat: lips, mucosa, and tongue normal; teeth and gums normal Neck: no adenopathy, no carotid bruit, supple, symmetrical, trachea midline and thyroid not enlarged, symmetric, no tenderness/mass/nodules Back: symmetric, no curvature. ROM normal. No CVA tenderness. Lungs: clear to auscultation bilaterally Heart: regular rate and rhythm, S1, S2 normal, no murmur, click, rub or gallop Abdomen: soft, non-tender; bowel sounds normal; no masses,  no organomegaly Pulses: 2+ and symmetric Skin: Skin color, texture, turgor normal. No rashes or lesions Lymph nodes: Cervical, supraclavicular, and axillary nodes normal.  Assessment and  Plan:  Pain, pelvic, female UA is normal and she is not constipated.  Exam suggests with ovarian cyst .  Pelvic ultrasound ordered.  vicodin one time rx given,    Updated Medication List Outpatient Encounter Prescriptions as of 01/05/2014  Medication Sig  . albuterol (PROVENTIL HFA;VENTOLIN HFA) 108 (90 BASE) MCG/ACT inhaler Inhale 2 puffs into the lungs every 6 (six) hours as needed for wheezing or shortness of breath.  . ALPRAZolam (XANAX XR) 1 MG 24 hr tablet Take 1 tablet (1 mg total) by mouth every morning.  [name] buPROPion (WELLBUTRIN SR) 150 MG 12 hr tablet TAKE ONE TABLET BY MOUTH 2 TIMES A DAY  . citalopram (CELEXA) 20 MG tablet Take 1 tablet (20 mg total) by mouth daily.  . metoprolol succinate (TOPROL-XL) 25 MG 24 hr tablet Take 1 tablet (25 mg total) by mouth daily. At bedtime for blood pressure/headache prevention  . mometasone-formoterol (DULERA) 100-5 MCG/ACT AERO Inhale 2 puffs into the lungs 2 (two) times daily.  . valACYclovir (VALTREX) 500 MG tablet 1 tablet daily  . zolpidem (AMBIEN CR) 12.5 MG CR tablet TAKE ONE TABLET BY MOUTH AT BEDTIME AS NEEDED FOR SLEEP  . ALPRAZolam (XANAX) 0.5 MG tablet TAKE ONE TABLET BY MOUTH DAILY AS NEEDED FOR EXTREME ANXIETY(IN ADDITION TO XR)  . HYDROcodone-acetaminophen (NORCO/VICODIN) 5-325 MG per tablet Take 1 tablet by mouth every 6 (six) hours as needed for moderate pain.  [name] PARoxetine (PAXIL) 10 MG tablet Take 1 tablet (10 mg total) by mouth daily.  . traMADol (ULTRAM) 50 MG tablet Take 1 tablet (50 mg total) by mouth every 8 (eight) hours as needed for pain.  . [DISCONTINUED] ARIPiprazole (ABILIFY) 10 MG tablet Take 1 tablet (10 mg total) by mouth daily.  . [DISCONTINUED] fluconazole (DIFLUCAN) 150 MG tablet Take 1 tablet (150 mg total) by mouth daily.  . [DISCONTINUED] nicotine (NICODERM CQ) 21 mg/24hr patch Place 1 patch (21 mg total) onto the skin daily.     Orders Placed This Encounter  Procedures  . Urine Culture  . US  Transvaginal Non-OB  . Urinalysis, Routine w reflex microscopic  . POCT Urinalysis Dipstick    No Follow-up on file.

## 2014-01-05 NOTE — Telephone Encounter (Signed)
Relevant patient education assigned to patient using Emmi. ° °

## 2014-01-05 NOTE — Progress Notes (Signed)
Pre-visit discussion using our clinic review tool. No additional management support is needed unless otherwise documented below in the visit note.  

## 2014-01-06 ENCOUNTER — Encounter: Payer: Self-pay | Admitting: Internal Medicine

## 2014-01-06 ENCOUNTER — Telehealth: Payer: Self-pay | Admitting: *Deleted

## 2014-01-06 DIAGNOSIS — R1031 Right lower quadrant pain: Principal | ICD-10-CM

## 2014-01-06 DIAGNOSIS — G8929 Other chronic pain: Secondary | ICD-10-CM

## 2014-01-06 LAB — URINE CULTURE
Colony Count: NO GROWTH
Organism ID, Bacteria: NO GROWTH

## 2014-01-06 NOTE — Assessment & Plan Note (Addendum)
UA is normal and she is not constipated. abd ultrasound last month was negative for gallstones,  CBD dilation or pancreatic cyst. Today's exam suggests with ovarian cyst .  Pelvic ultrasound ordered.  vicodin one time rx given,

## 2014-01-06 NOTE — Telephone Encounter (Signed)
Her urine was normal and the ultrasound of her pelvis was normal as well.  If her pain persists,  wewil need to get a CT Scan of abd and pelvis

## 2014-01-06 NOTE — Telephone Encounter (Signed)
Pt called requesting lab results. Please advise.  

## 2014-01-07 NOTE — Telephone Encounter (Signed)
I have ordered a CT of the ab and pelvis with contrast. Hoyle Sauer will call her with appt

## 2014-01-07 NOTE — Telephone Encounter (Signed)
Patient notified

## 2014-01-07 NOTE — Telephone Encounter (Signed)
Spoke with pt, advised of results; pt states she is still in pain further states she is having the same amount of pain with no change.  Please advise

## 2014-01-09 ENCOUNTER — Telehealth: Payer: Self-pay | Admitting: Internal Medicine

## 2014-01-09 NOTE — Telephone Encounter (Signed)
Patient called and stated that she continues to have abdominal pain and would like to know where the referral stands for the CT Please advise?

## 2014-01-12 ENCOUNTER — Telehealth: Payer: Self-pay | Admitting: Internal Medicine

## 2014-01-12 MED ORDER — HYDROCODONE-ACETAMINOPHEN 5-325 MG PO TABS: 1.0000 | ORAL_TABLET | Freq: Four times a day (QID) | ORAL | Status: DC | PRN

## 2014-01-12 NOTE — Telephone Encounter (Signed)
Patient CT scan is not until 01/13/14 patient requesting refill on Hydrocodone, still having abdominal pain, Please advise?

## 2014-01-12 NOTE — Telephone Encounter (Signed)
Ok to refill,  printed rx  

## 2014-01-12 NOTE — Telephone Encounter (Signed)
Tried to reach patient could not leave message script ready for pick up and placed at front desk.

## 2014-01-13 ENCOUNTER — Ambulatory Visit: Payer: Self-pay | Admitting: Internal Medicine

## 2014-01-13 NOTE — Telephone Encounter (Signed)
Patient notified script ready for pick up

## 2014-01-14 ENCOUNTER — Telehealth: Payer: Self-pay | Admitting: Internal Medicine

## 2014-01-14 DIAGNOSIS — R1031 Right lower quadrant pain: Secondary | ICD-10-CM

## 2014-01-14 DIAGNOSIS — R102 Pelvic and perineal pain: Secondary | ICD-10-CM

## 2014-01-14 DIAGNOSIS — G8929 Other chronic pain: Secondary | ICD-10-CM

## 2014-01-14 NOTE — Telephone Encounter (Signed)
Her Ct was normal except for an ill defined area of the colon on the right side that might be a partial blockage or a mass.  She needs to have further evaluation with a colonoscopy .  I would like to make the referral to Dr Bary Castilla unless she has a different preference

## 2014-01-14 NOTE — Telephone Encounter (Signed)
Referral is in process as requested 

## 2014-01-14 NOTE — Assessment & Plan Note (Signed)
CT noted possible mass in ascending colon.  Referral for colonoscopy is advised.

## 2014-01-14 NOTE — Telephone Encounter (Signed)
Patient notified and voiced understanding and chooses to go with Dr. Bary Castilla.

## 2014-01-15 NOTE — Telephone Encounter (Signed)
Patient notified in process. 

## 2014-01-18 ENCOUNTER — Emergency Department: Payer: Self-pay | Admitting: Emergency Medicine

## 2014-01-19 ENCOUNTER — Encounter: Payer: Self-pay | Admitting: Gastroenterology

## 2014-01-21 ENCOUNTER — Telehealth: Payer: Self-pay | Admitting: Internal Medicine

## 2014-01-21 NOTE — Telephone Encounter (Signed)
Patient notified that as soon as FMLA is completed will notify patient.

## 2014-01-21 NOTE — Telephone Encounter (Signed)
Patient stated she needs the FMLA if she has to be out of work any further she does not have any sick time left, patient was seen in the ED 01/18/14 for abdominal pain MD in the ER told patient CT scan showed she is constipated gave enema, patient returned to work on 01/19/14 and is continuing to work patient would like the FMLA in case pain becomes severe again and she cannot work. Patient scheduled to see Dr. Gearlean Alf two weeks. Patient stated she needs FMLA to read she may have to be out during the next few months for pain associated with possible bowel obstruction or until colonoscopy performed. Patient ask for 2 months.

## 2014-01-21 NOTE — Telephone Encounter (Signed)
Placed back in red folder.

## 2014-01-21 NOTE — Telephone Encounter (Signed)
Pt called in and was inquiring about insurance papers that were faxed in to our office and wanted to know if they have been filled out and sent back.

## 2014-01-21 NOTE — Telephone Encounter (Signed)
FMLS form is not completed because i do not know when she stayed out of work and why.

## 2014-01-21 NOTE — Telephone Encounter (Signed)
Ok, that info is very helpful

## 2014-01-22 ENCOUNTER — Telehealth: Payer: Self-pay | Admitting: Internal Medicine

## 2014-01-23 ENCOUNTER — Encounter: Payer: Self-pay | Admitting: Internal Medicine

## 2014-01-23 ENCOUNTER — Encounter: Payer: Self-pay | Admitting: Gastroenterology

## 2014-01-23 ENCOUNTER — Ambulatory Visit (INDEPENDENT_AMBULATORY_CARE_PROVIDER_SITE_OTHER): Payer: 59 | Admitting: Gastroenterology

## 2014-01-23 VITALS — BP 132/80 | HR 80 | Ht 64.0 in | Wt 201.8 lb

## 2014-01-23 DIAGNOSIS — R1031 Right lower quadrant pain: Secondary | ICD-10-CM

## 2014-01-23 DIAGNOSIS — R9389 Abnormal findings on diagnostic imaging of other specified body structures: Secondary | ICD-10-CM

## 2014-01-23 MED ORDER — MOVIPREP 100 G PO SOLR: 1.0000 | Freq: Once | ORAL | Status: DC

## 2014-01-23 NOTE — Telephone Encounter (Signed)
Returned to you completed

## 2014-01-23 NOTE — Patient Instructions (Addendum)
One of your biggest health concerns is your smoking.  This increases your risk for most cancers and serious cardiovascular diseases such as strokes, heart attacks.  You should try your best to stop.  If you need assistance, please contact your PCP or Smoking Cessation Class at [facility] [phone]) or [facility] (1-800-QUIT-NOW). You will be set up for a colonoscopy (for RLQ pain, abnormal CT scan of colon)

## 2014-01-23 NOTE — Progress Notes (Signed)
HPI: This is a  very pleasant 48 year old woman whom I am meeting for the first time today.  About a month of RLQ pain,   Was constipated a month ago, out of town.  Took a laxative and went to [facility], but still had pain, bloating in RLQ  Previously had several months of diarrhea.  No bleeding.  Stable weight.  Has been fatigued a lot lately.  Labs 11/2013: cbc, cmet, lipase, esr were all normal [place] 11/2013: normal CT 12/2013: soft tissue fullness in ascending colon, otherwise normal   Review of systems: Pertinent positive and negative review of systems were noted in the above HPI section. Complete review of systems was performed and was otherwise normal.    Past Medical History  Diagnosis Date  . Sleep apnea   . Depression   . Heart murmur   . Hyperlipidemia   . Allergy   . Asthma     Past Surgical History  Procedure Laterality Date  . Abdominal hysterectomy  2008    partial    Current Outpatient Prescriptions  Medication Sig Dispense Refill  . albuterol (PROVENTIL HFA;VENTOLIN HFA) 108 (90 BASE) MCG/ACT inhaler Inhale 2 puffs into the lungs every 6 (six) hours as needed for wheezing or shortness of breath.  1 Inhaler  5  . ALPRAZolam (XANAX) 0.5 MG tablet TAKE ONE TABLET BY MOUTH DAILY AS NEEDED FOR EXTREME ANXIETY(IN ADDITION TO XR)  30 tablet  3  . buPROPion (WELLBUTRIN SR) 150 MG 12 hr tablet TAKE ONE TABLET BY MOUTH 2 TIMES A DAY  60 tablet  2  . citalopram (CELEXA) 20 MG tablet Take 1 tablet (20 mg total) by mouth daily.  30 tablet  2  . HYDROcodone-acetaminophen (NORCO/VICODIN) 5-325 MG per tablet Take 1 tablet by mouth every 6 (six) hours as needed for moderate pain.  30 tablet  0  . metoprolol succinate (TOPROL-XL) 25 MG 24 hr tablet Take 1 tablet (25 mg total) by mouth daily. At bedtime for blood pressure/headache prevention  90 tablet  3  . mometasone-formoterol (DULERA) 100-5 MCG/ACT AERO Inhale 2 puffs into the lungs 2 (two) times daily.  39 g  0  . PARoxetine  (PAXIL) 10 MG tablet Take 1 tablet (10 mg total) by mouth daily.  30 tablet  3  . valACYclovir (VALTREX) 500 MG tablet 1 tablet daily  30 tablet  11  . zolpidem (AMBIEN CR) 12.5 MG CR tablet TAKE ONE TABLET BY MOUTH AT BEDTIME AS NEEDED FOR SLEEP  30 tablet  5   No current facility-administered medications for this visit.    Allergies as of 01/23/2014  . (No Known Allergies)    Family History  Problem Relation Age of Onset  . Kidney disease Mother 94    ESKD, partial neprhectomy  . COPD Mother   . Heart disease Father     CABG at age 54  . Alcohol abuse Father     until late 42  . Diabetes Father   . Hypertension Father   . Diabetes Sister   . Cancer Sister     uterine ca  . Cancer Maternal Uncle 60    stage 4 colon CA    History   Social History  . Marital Status: Married    Spouse Name: N/A    Number of Children: N/A  . Years of Education: N/A   Occupational History  . Not on file.   Social History Main Topics  . Smoking status: Current Every Day  Smoker -- 0.15 packs/day  . Smokeless tobacco: Not on file  . Alcohol Use: Yes     Comment: occasionally  . Drug Use: No  . Sexual Activity: Not Currently   Other Topics Concern  . Not on file   Social History Narrative  . No narrative on file       Physical Exam: BP 132/80  Pulse 80  Ht _0  (1.626 m)  Wt 201 lb 12.8 oz (91.536 kg)  BMI 34.62 kg/m2 Constitutional: generally well-appearing Psychiatric: alert and oriented x3 Eyes: extraocular movements intact Mouth: oral pharynx moist, no lesions Neck: supple no lymphadenopathy Cardiovascular: heart regular rate and rhythm Lungs: clear to auscultation bilaterally Abdomen: soft, nontender, nondistended, no obvious ascites, no peritoneal signs, normal bowel sounds Extremities: no lower extremity edema bilaterally Skin: no lesions on visible extremities    Assessment and plan: 48 y.o. female with  right lower quadrant discomfort, abnormal CT scan  of her ascending colon  She has an abnormal right colon and pain in her right lower quadrant. I'm concerned about the possibility of neoplasm cannot like to proceed with colonoscopy at her soonest convenience. I see no reason for any further blood tests or imaging studies prior to that.

## 2014-01-24 DIAGNOSIS — Z0279 Encounter for issue of other medical certificate: Secondary | ICD-10-CM

## 2014-01-26 NOTE — Telephone Encounter (Signed)
Faxed FMLA for patient and tried to call patient for pick up patient has no voicemail could not reach patient placed copy at front desk. Copies made for chart and for billing.

## 2014-01-26 NOTE — Telephone Encounter (Signed)
Patient returned call and was notified forms ready for pick up.

## 2014-01-28 ENCOUNTER — Encounter: Payer: Self-pay | Admitting: Gastroenterology

## 2014-01-28 ENCOUNTER — Ambulatory Visit (AMBULATORY_SURGERY_CENTER): Payer: 59 | Admitting: Gastroenterology

## 2014-01-28 VITALS — BP 124/77 | HR 71 | Temp 98.1°F | Resp 16 | Ht 64.0 in | Wt 201.0 lb

## 2014-01-28 DIAGNOSIS — D126 Benign neoplasm of colon, unspecified: Secondary | ICD-10-CM

## 2014-01-28 DIAGNOSIS — R1031 Right lower quadrant pain: Secondary | ICD-10-CM

## 2014-01-28 MED ORDER — SODIUM CHLORIDE 0.9 % IV SOLN: 500.0000 mL | INTRAVENOUS | Status: DC

## 2014-01-28 NOTE — Op Note (Signed)
[facility]  [address]. [place], [id number]   COLONOSCOPY PROCEDURE REPORT  PATIENT: Fisher, Melanie  MR#: [id number] BIRTHDATE: 1965-10-18 , 48  yrs. old GENDER: Female ENDOSCOPIST: Milus Banister, MD REFERRED YP:[id number] Derrel Nip, M.D. PROCEDURE DATE:  01/28/2014 PROCEDURE:   Colonoscopy with snare polypectomy First Screening Colonoscopy - Avg.  risk and is 50 yrs.  old or older - No.  Prior Negative Screening - Now for repeat screening. N/A  History of Adenoma - Now for follow-up colonoscopy & has been > or = to 3 yrs.  N/A  Polyps Removed Today? Yes. ASA CLASS:   Class II INDICATIONS:pain in RLQ, "soft tissue in ascending colon" on CT. MEDICATIONS: MAC sedation, administered by CRNA and Propofol (Diprivan) 350 mg IV  DESCRIPTION OF PROCEDURE:   After the risks benefits and alternatives of the procedure were thoroughly explained, informed consent was obtained.  A digital rectal exam revealed no abnormalities of the rectum.   The LB [id number]  endoscope was introduced through the anus and advanced to the terminal ileum which was intubated for a short distance. No adverse events experienced.   The quality of the prep was excellent.  The instrument was then slowly withdrawn as the colon was fully examined.  COLON FINDINGS: The terminal ileum was normal.  Three polyps were found, removed and sent to pathology.  These were all sessile, located in ascending and sigmoid segments, 3-48mm across, all removed with cold snare.  There were some somewhat thickened folds in the ascending segment, just distal to the IC valve.  Not pathologic appearing at all.  The examination was otherwise normal. Retroflexed views revealed no abnormalities. The time to cecum=2 minutes 39 seconds.  Withdrawal time=11 minutes 32 seconds.  The scope was withdrawn and the procedure completed. COMPLICATIONS: There were no complications.  ENDOSCOPIC IMPRESSION: The terminal ileum  was normal.  Three polyps were found, removed and sent to pathology. There were some somewhat thickened folds in the ascending segment, just distal to the IC valve.  Not pathologic appearing at all.  The examination was otherwise normal.  RECOMMENDATIONS: If the polyp(s) removed today are proven to be adenomatous (pre-cancerous) polyps, you will need a colonoscopy in 3-5 years. Otherwise you should continue to follow colorectal cancer screening guidelines for "routine risk" patients with a colonoscopy in 10 years.  You will receive a letter within 1-2 weeks with the results of your biopsy as well as final recommendations.  Please call my office if you have not received a letter after 3 weeks.   eSigned:  Milus Banister, MD 01/28/2014 2:40 PM

## 2014-01-28 NOTE — Patient Instructions (Addendum)
YOU HAD AN ENDOSCOPIC PROCEDURE TODAY AT THE [facility]: Refer to the procedure report that was given to you for any specific questions about what was found during the examination.  If the procedure report does not answer your questions, please call your gastroenterologist to clarify.  If you requested that your care partner not be given the details of your procedure findings, then the procedure report has been included in a sealed envelope for you to review at your convenience later.  YOU SHOULD EXPECT: Some feelings of bloating in the abdomen. Passage of more gas than usual.  Walking can help get rid of the air that was put into your GI tract during the procedure and reduce the bloating. If you had a lower endoscopy (such as a colonoscopy or flexible sigmoidoscopy) you may notice spotting of blood in your stool or on the toilet paper. If you underwent a bowel prep for your procedure, then you may not have a normal bowel movement for a few days.  DIET: Your first meal following the procedure should be a light meal and then it is ok to progress to your normal diet.  A half-sandwich or bowl of soup is an example of a good first meal.  Heavy or fried foods are harder to digest and may make you feel nauseous or bloated.  Likewise meals heavy in dairy and vegetables can cause extra gas to form and this can also increase the bloating.  Drink plenty of fluids but you should avoid alcoholic beverages for 24 hours.  ACTIVITY: Your care partner should take you home directly after the procedure.  You should plan to take it easy, moving slowly for the rest of the day.  You can resume normal activity the day after the procedure however you should NOT DRIVE or use heavy machinery for 24 hours (because of the sedation medicines used during the test).    SYMPTOMS TO REPORT IMMEDIATELY: A gastroenterologist can be reached at any hour.  During normal business hours, 8:30 AM to 5:00 PM Monday through Friday,  call [phone].  After hours and on weekends, please call the GI answering service at [phone] who will take a message and have the physician on call contact you.   Following lower endoscopy (colonoscopy or flexible sigmoidoscopy):  Excessive amounts of blood in the stool  Significant tenderness or worsening of abdominal pains  Swelling of the abdomen that is new, acute  Fever of 100F or higher    FOLLOW UP: If any biopsies were taken you will be contacted by phone or by letter within the next 1-3 weeks.  Call your gastroenterologist if you have not heard about the biopsies in 3 weeks.  Our staff will call the home number listed on your records the next business day following your procedure to check on you and address any questions or concerns that you may have at that time regarding the information given to you following your procedure. This is a courtesy call and so if there is no answer at the home number and we have not heard from you through the emergency physician on call, we will assume that you have returned to your regular daily activities without incident.  SIGNATURES/CONFIDENTIALITY: You and/or your care partner have signed paperwork which will be entered into your electronic medical record.  These signatures attest to the fact that that the information above on your After Visit Summary has been reviewed and is understood.  Full responsibility of the confidentiality   of this discharge information lies with you and/or your care-partner.     

## 2014-01-28 NOTE — Progress Notes (Signed)
Called to room to assist during endoscopic procedure.  Patient ID and intended procedure confirmed with present staff. Received instructions for my participation in the procedure from the performing physician.  

## 2014-01-28 NOTE — Progress Notes (Signed)
Procedure ends, to recovery, report given and VSS. 

## 2014-01-30 ENCOUNTER — Telehealth: Payer: Self-pay | Admitting: *Deleted

## 2014-01-30 NOTE — Telephone Encounter (Signed)
  Follow up Call-  Call back number 01/28/2014  Post procedure Call Back phone  # [phone] cell  Permission to leave phone message No  comments does not go to vice mail per pt     Patient questions:  Do you have a fever, pain , or abdominal swelling? Yes.  Patient states that's why she came in in the first place. "No need to call the doctor." Pain Score  8 *  Have you tolerated food without any problems? Yes.    Have you been able to return to your normal activities? Yes.    Do you have any questions about your discharge instructions: Diet   No. Medications  No. Follow up visit   Do you have questions or concerns about your Care? No.  Actions: * If pain score is 4 or above: Physician/ provider Notified : Owens Loffler, MD.

## 2014-02-02 ENCOUNTER — Other Ambulatory Visit: Payer: Self-pay | Admitting: Internal Medicine

## 2014-02-02 NOTE — Telephone Encounter (Signed)
Pt called in and stated she left a vm about a refill for her hydrocodone and hasn't heard anything back.

## 2014-02-02 NOTE — Telephone Encounter (Signed)
Refill? Last filled 01/12/14 #30. Last OV 01/05/14

## 2014-02-03 ENCOUNTER — Ambulatory Visit: Payer: Self-pay | Admitting: General Surgery

## 2014-02-04 NOTE — Telephone Encounter (Signed)
The patient's husband called wanting to know if the patient's pain medication had been filled.

## 2014-02-04 NOTE — Telephone Encounter (Signed)
Spoke to patient, states she is taking every 6 hours as directed, so using 3-4 tablets daily, was dispensed #30 01/12/14.  Also states she wanted an appointment with Dr. Derrel Nip for persistent right abdominal pain and rectal pain. Unchanged since last visit. Denies fever, N/V, no bowel changes.  Offered appointment with [facility] [place] office for earlier appt, pt declined. Scheduled appt 02/09/14. Advised to call back with worsening or changing symptoms.

## 2014-02-04 NOTE — Telephone Encounter (Signed)
Refill was denied becaseu she was requesting it too early,.  The message was sent to [facility] last week

## 2014-02-05 ENCOUNTER — Encounter: Payer: Self-pay | Admitting: Gastroenterology

## 2014-02-09 ENCOUNTER — Encounter: Payer: Self-pay | Admitting: Internal Medicine

## 2014-02-09 ENCOUNTER — Ambulatory Visit (INDEPENDENT_AMBULATORY_CARE_PROVIDER_SITE_OTHER): Payer: 59 | Admitting: Internal Medicine

## 2014-02-09 VITALS — BP 138/78 | HR 89 | Temp 98.8°F | Resp 16 | Ht 64.0 in | Wt 200.0 lb

## 2014-02-09 DIAGNOSIS — M546 Pain in thoracic spine: Secondary | ICD-10-CM

## 2014-02-09 DIAGNOSIS — N949 Unspecified condition associated with female genital organs and menstrual cycle: Secondary | ICD-10-CM

## 2014-02-09 DIAGNOSIS — IMO0002 Reserved for concepts with insufficient information to code with codable children: Secondary | ICD-10-CM

## 2014-02-09 DIAGNOSIS — M5134 Other intervertebral disc degeneration, thoracic region: Secondary | ICD-10-CM

## 2014-02-09 DIAGNOSIS — R102 Pelvic and perineal pain: Secondary | ICD-10-CM

## 2014-02-09 DIAGNOSIS — Z86010 Personal history of colonic polyps: Secondary | ICD-10-CM

## 2014-02-09 MED ORDER — HYDROCODONE-ACETAMINOPHEN 5-325 MG PO TABS: 1.0000 | ORAL_TABLET | Freq: Four times a day (QID) | ORAL | Status: DC | PRN

## 2014-02-09 MED ORDER — DICLOFENAC SODIUM 75 MG PO TBEC: 75.0000 mg | DELAYED_RELEASE_TABLET | Freq: Two times a day (BID) | ORAL | Status: DC

## 2014-02-09 MED ORDER — METHOCARBAMOL 500 MG PO TABS: 500.0000 mg | ORAL_TABLET | Freq: Every evening | ORAL | Status: DC | PRN

## 2014-02-09 NOTE — Patient Instructions (Signed)
I think your pain may be coming from a spine issue   I am ordering x rays of your thoracic spine and right ribs to look for problems  I am refilling your vicodin for use twice daily  But also please start the diclofenac twice daily with food.  As you anti inflammatory  DO NOT TAKE MOTRIN OR ADVIL with this .  Save the methocarbamol (muscle relaxer) for night time use  Use a heating pad on the muscles that are spasmin

## 2014-02-09 NOTE — Progress Notes (Signed)
Pre-visit discussion using our clinic review tool. No additional management support is needed unless otherwise documented below in the visit note.  

## 2014-02-09 NOTE — Progress Notes (Signed)
Patient ID: Melanie Fisher, female   DOB: [birth year], 48 y.o.   MRN: [id number]   Patient Active Problem List   Diagnosis Date Noted  . Pain, pelvic, female 01/05/2014  . Headache above the eye region 11/18/2013  . Other chest pain 11/18/2013  . Obesity, unspecified 07/01/2013  . Tobacco abuse counseling 03/15/2013  . Rash 02/18/2013  . Routine general medical examination at a health care facility 09/04/2012  . COPD (chronic obstructive pulmonary disease) 05/26/2012  . Chronic shoulder pain 04/09/2012  . Generalized anxiety disorder 04/06/2012  . Tobacco abuse 04/06/2012  . Depression   . Heart murmur   . Hyperlipidemia   . Allergy   . COPD exacerbation     Subjective:  CC:   Chief Complaint  Patient presents with  . Acute Visit    right upper quadrant abdominal pain feels pressure and is painful.  . Abdominal Pain    Polyps removed 01/28/14 and was told she has a fold in colon.    HPI:   Melanie Fisher is a 48 y.o. female who presents for Persistent Right upper quadrant /right sided  pain for the last 4 to 6 weeks. First seen on July 21 with pain complaint which involved the pelvic area.  Went to ER on Aug 2 Had a 3 way  film on August 2 that was ordered by Er physician for severe pain.  Rectal exam done for obstipation was negative for stool in vault.  Was told she was constipated and advised to drink mg citrate but had only a small amount of stool .   Had abnormal finding on CT of abd and pelvis on the right  which led to GI evaluation .  Had colonoscopy on August 12 for further evaluation ,  3 sessile polyps found, one in ascending colon, and a "thickened fold" which Dr. Ardis Hughs mentioned in report and to patient that he "didn't think looked suspicious"  Not sure if biopsy was done of area.  Polyps were adenomatous,  Pain persists but is now in the mid thoracic region and starts on the lateral side, and goes under the breast . The pain is constant,  24/7  improved  with lying on left side only.  Started after picking up something heavy. No history of back injury  She has returned to work but is still having moderate pain .      Past Medical History  Diagnosis Date  . Sleep apnea   . Depression   . Heart murmur   . Hyperlipidemia   . Allergy   . Asthma   . Anxiety     Past Surgical History  Procedure Laterality Date  . Abdominal hysterectomy  2008    partial  . Right toe bunionectomy    . Right wrist ganglion cyst removal         The following portions of the patient's history were reviewed and updated as appropriate: Allergies, current medications, and problem list.    Review of Systems:   Patient denies headache, fevers, malaise, unintentional weight loss, skin rash, eye pain, sinus congestion and sinus pain, sore throat, dysphagia,  hemoptysis , cough, dyspnea, wheezing, chest pain, palpitations, orthopnea, edema, abdominal pain, nausea, melena, diarrhea, constipation, flank pain, dysuria, hematuria, urinary  Frequency, nocturia, numbness, tingling, seizures,  Focal weakness, Loss of consciousness,  Tremor, insomnia, depression, anxiety, and suicidal ideation.     History   Social History  . Marital Status: Married    Spouse Name:  N/A    Number of Children: N/A  . Years of Education: N/A   Occupational History  . Not on file.   Social History Main Topics  . Smoking status: Current Every Day Smoker -- 1.50 packs/day  . Smokeless tobacco: Not on file  . Alcohol Use: Yes     Comment: occasionally  . Drug Use: No  . Sexual Activity: Not Currently   Other Topics Concern  . Not on file   Social History Narrative  . No narrative on file    Objective:  Filed Vitals:   02/09/14 1832  BP: 138/78  Pulse: 89  Temp: 98.8 F (37.1 C)  Resp: 16     General appearance: alert, cooperative and appears stated age Ears: normal TM's and external ear canals both ears Throat: lips, mucosa, and tongue normal; teeth and gums  normal Neck: no adenopathy, no carotid bruit, supple, symmetrical, trachea midline and thyroid not enlarged, symmetric, no tenderness/mass/nodules Back: symmetric, no curvature. ROM normal. No CVA tenderness. Lungs: clear to auscultation bilaterally Heart: regular rate and rhythm, S1, S2 normal, no murmur, click, rub or gallop Abdomen: soft, non-tender; bowel sounds normal; no masses,  no organomegaly Pulses: 2+ and symmetric Skin: Skin color, texture, turgor normal. No rashes or lesions Lymph nodes: Cervical, supraclavicular, and axillary nodes normal.  Assessment and Plan: Pain, pelvic, female CT and colonoscopy were done with only polyps found. Pain is now right upper quadrant and appears to me MSK in origin.  Plain filsm of thoracic spine ordered to eval for spondylosis may need DO eval or MRI thoracic spine.  vicodin refilled,  Adding NSAID and prn MR   Personal history of colonic polyps 3 yr follow up recommended for tubular adenomas.    Updated Medication List Outpatient Encounter Prescriptions as of 02/09/2014  Medication Sig  . albuterol (PROVENTIL HFA;VENTOLIN HFA) 108 (90 BASE) MCG/ACT inhaler Inhale 2 puffs into the lungs every 6 (six) hours as needed for wheezing or shortness of breath.  . ALPRAZolam (XANAX) 0.5 MG tablet TAKE ONE TABLET BY MOUTH DAILY AS NEEDED FOR EXTREME ANXIETY(IN ADDITION TO XR)  . buPROPion (WELLBUTRIN SR) 150 MG 12 hr tablet TAKE ONE TABLET BY MOUTH 2 TIMES A DAY  . citalopram (CELEXA) 20 MG tablet Take 1 tablet (20 mg total) by mouth daily.  . metoprolol succinate (TOPROL-XL) 25 MG 24 hr tablet Take 1 tablet (25 mg total) by mouth daily. At bedtime for blood pressure/headache prevention  . mometasone-formoterol (DULERA) 100-5 MCG/ACT AERO Inhale 2 puffs into the lungs 2 (two) times daily.  [name] PARoxetine (PAXIL) 10 MG tablet Take 1 tablet (10 mg total) by mouth daily.  . valACYclovir (VALTREX) 500 MG tablet 1 tablet daily  . zolpidem (AMBIEN CR) 12.5 MG  CR tablet TAKE ONE TABLET BY MOUTH AT BEDTIME AS NEEDED FOR SLEEP  . diclofenac (VOLTAREN) 75 MG EC tablet Take 1 tablet (75 mg total) by mouth 2 (two) times daily.  [name] HYDROcodone-acetaminophen (NORCO/VICODIN) 5-325 MG per tablet Take 1 tablet by mouth every 6 (six) hours as needed for moderate pain.  . methocarbamol (ROBAXIN) 500 MG tablet Take 1 tablet (500 mg total) by mouth at bedtime as needed for muscle spasms.  . [DISCONTINUED] HYDROcodone-acetaminophen (NORCO/VICODIN) 5-325 MG per tablet Take 1 tablet by mouth every 6 (six) hours as needed for moderate pain.

## 2014-02-10 ENCOUNTER — Ambulatory Visit: Payer: Self-pay | Admitting: Internal Medicine

## 2014-02-11 DIAGNOSIS — Z860100 Personal history of colon polyps, unspecified: Secondary | ICD-10-CM | POA: Insufficient documentation

## 2014-02-11 NOTE — Assessment & Plan Note (Signed)
3 yr follow up recommended for tubular adenomas.

## 2014-02-11 NOTE — Assessment & Plan Note (Addendum)
CT and colonoscopy were done with only polyps found. Pain is now right upper quadrant and appears to me MSK in origin.  Plain filsm of thoracic spine ordered to eval for spondylosis may need DO eval or MRI thoracic spine.  vicodin refilled,  Adding NSAID and prn MR

## 2014-02-12 ENCOUNTER — Telehealth: Payer: Self-pay | Admitting: Internal Medicine

## 2014-02-12 DIAGNOSIS — M5134 Other intervertebral disc degeneration, thoracic region: Secondary | ICD-10-CM

## 2014-02-12 DIAGNOSIS — IMO0002 Reserved for concepts with insufficient information to code with codable children: Secondary | ICD-10-CM | POA: Insufficient documentation

## 2014-02-12 NOTE — Assessment & Plan Note (Signed)
She has multi level disk space narrowing and endplate spurring within the thoracic spine suggesting degenerative disk disease as the cause for her pain.  I would like her to see Dr Tamala Julian for treatment

## 2014-02-12 NOTE — Assessment & Plan Note (Signed)
She has multi level disk space narrowing and endplate spurring within the thoracic spine suggesting degenerative disk disease as the cause for her pain.  I would like her to see Dr Charlann Boxer for treatment

## 2014-02-12 NOTE — Telephone Encounter (Signed)
Patient notified of results and is agreeable to see sports medicine.

## 2014-02-12 NOTE — Telephone Encounter (Signed)
She has multi level disk space narrowing and endplate spurring within the thoracic spine suggesting degenerative disk disease as the cause for her pain.  I would like her to see Dr Charlann Boxer our sports medicine doctor for treatment

## 2014-02-24 ENCOUNTER — Ambulatory Visit: Payer: Self-pay | Admitting: Internal Medicine

## 2014-03-06 ENCOUNTER — Ambulatory Visit: Payer: 59 | Admitting: Family Medicine

## 2014-03-13 ENCOUNTER — Telehealth: Payer: Self-pay | Admitting: Internal Medicine

## 2014-03-13 NOTE — Telephone Encounter (Signed)
Patient called for refill on hydrocodone please advise ok to fill?  Last filled 02/09/14

## 2014-03-13 NOTE — Telephone Encounter (Signed)
No refills.  Patient had appt with [facility] on sept 18th and did not keep it?

## 2014-03-15 ENCOUNTER — Other Ambulatory Visit: Payer: Self-pay | Admitting: Internal Medicine

## 2014-03-16 MED ORDER — HYDROCODONE-ACETAMINOPHEN 5-325 MG PO TABS: 1.0000 | ORAL_TABLET | Freq: Four times a day (QID) | ORAL | Status: DC | PRN

## 2014-03-16 NOTE — Telephone Encounter (Signed)
Pt sent mychart with request. Responded with Dr. Lupita Dawn response and requested response back regarding cancelled appt

## 2014-03-16 NOTE — Telephone Encounter (Signed)
Ok to refill,  printed rx  

## 2014-03-16 NOTE — Telephone Encounter (Signed)
Sent mychart, notified Rx ready for pickup

## 2014-03-16 NOTE — Telephone Encounter (Signed)
See mychart messages

## 2014-03-23 ENCOUNTER — Ambulatory Visit (INDEPENDENT_AMBULATORY_CARE_PROVIDER_SITE_OTHER): Payer: 59 | Admitting: Family Medicine

## 2014-03-23 ENCOUNTER — Encounter: Payer: Self-pay | Admitting: Family Medicine

## 2014-03-23 VITALS — BP 130/80 | HR 85 | Ht 64.0 in | Wt 198.0 lb

## 2014-03-23 DIAGNOSIS — M999 Biomechanical lesion, unspecified: Secondary | ICD-10-CM | POA: Insufficient documentation

## 2014-03-23 DIAGNOSIS — M9902 Segmental and somatic dysfunction of thoracic region: Secondary | ICD-10-CM

## 2014-03-23 DIAGNOSIS — M940 Chondrocostal junction syndrome [Tietze]: Secondary | ICD-10-CM

## 2014-03-23 DIAGNOSIS — Z716 Tobacco abuse counseling: Secondary | ICD-10-CM

## 2014-03-23 DIAGNOSIS — M9908 Segmental and somatic dysfunction of rib cage: Secondary | ICD-10-CM

## 2014-03-23 DIAGNOSIS — M5134 Other intervertebral disc degeneration, thoracic region: Secondary | ICD-10-CM

## 2014-03-23 DIAGNOSIS — R0781 Pleurodynia: Secondary | ICD-10-CM | POA: Insufficient documentation

## 2014-03-23 NOTE — Assessment & Plan Note (Signed)
Due to patient's muscle imbalances I think her pain is secondary to more of a slipped rib. Patient also has an overlying lipoma that could also be contributing to some of the discomfort. Patient did respond very well to osteopathic manipulation today with near complete resolution of pain. Patient states that she can breathe better as well. Patient given home exercises focusing on the rhomboid as well as thoracic back strengthening exercises. We discussed icing: Over-the-counter medications he can help. Patient does have degenerative disc disease at multiple levels of the thoracic spine I will need to monitor. Patient come back again in 3 weeks for further evaluation and treatment.

## 2014-03-23 NOTE — Assessment & Plan Note (Signed)
Discussed quitting.

## 2014-03-23 NOTE — Patient Instructions (Addendum)
Very nice to meet you Ice 20 minutes 2 times daily. Usually after activity and before bed. Exercises 3 times a week.  Try pennsaid twice daily.  Vitamin D 2000 IU daily Turmeric 500mg  twice daily On wall heels, butt shoulder and head touching for 5 minutes daily.  Also stop smoking if you can! Come back in 3 weeks to make sure you are doing well or if need more manipulation.

## 2014-03-23 NOTE — Progress Notes (Signed)
[name] [address] [place], [place] [address] Phone: [phone] Subjective:    I'm seeing this patient by the request  of:  TULLO,TERESA, MD   CC: Right sided pain  TLX:[id number] Melanie Fisher is a 48 y.o. female coming in with complaint of right-sided pain. Patient has had pain in the thoracic spine for quite some time. Patient has had the pain now for almost 2 months. Patient was first seen July 21 when she was having pain in the pelvic area. Patient did have significant constipation at that time. Patient continued to have pain and did have a CT scan of the abdomen did not show any significant pathology.  Patient states that she can still do her activities of daily living but has mild discomfort at the end of the day. Denies any numbness or tingling, denies any radiation. Patient states that the pain is worse at night and she can not lay on her back secondary to pain. Patient denies any association with food, denies any fever, chills, or any abnormal weight loss. Patient is a smoker but CT scan did not show any nodularity. Patient is a severity a 6/10. Not responding over-the-counter medicine but hydrocodone does seem to be beneficial. X-rays were reviewed by me as well and patient does have some degenerative disc disease of the thoracic spine.    Past medical history, social, surgical and family history all reviewed in electronic medical record.   Review of Systems: No headache, visual changes, nausea, vomiting, diarrhea, constipation, dizziness, abdominal pain, skin rash, fevers, chills, night sweats, weight loss, swollen lymph nodes, body aches, joint swelling, muscle aches, chest pain, shortness of breath, mood changes.   Objective Blood pressure 130/80, pulse 85, height 5\' 4"  (1.626 m), weight 198 lb (89.812 kg), SpO2 96.00%.  General: No apparent distress alert and oriented x3 mood and affect normal, dressed appropriately.  HEENT: Pupils  equal, extraocular movements intact  Respiratory: Patient's speak in full sentences and does not appear short of breath  Cardiovascular: No lower extremity edema, non tender, no erythema  Skin: Warm dry intact with no signs of infection or rash on extremities or on axial skeleton.  Abdomen: Soft nontender  Neuro: Cranial nerves II through XII are intact, neurovascularly intact in all extremities with 2+ DTRs and 2+ pulses.  Lymph: No lymphadenopathy of posterior or anterior cervical chain or axillae bilaterally.  Gait normal with good balance and coordination.  MSK:  Non tender with full range of motion and good stability and symmetric strength and tone of shoulders, elbows, wrist, hip, knee and ankles bilaterally.  Back Exam:  Inspection: Significantly poor posture of the significant weakness. Motion: Flexion 45 deg, Extension 45 deg, Side Bending to 45 deg bilaterally,  Rotation to 45 deg bilaterally  SLR laying: Negative  XSLR laying: Negative  Palpable tenderness: Tender to palpation over the sixth rib on the right side. Mostly over the posterior lateral angle the FABER: negative. Sensory change: Gross sensation intact to all lumbar and sacral dermatomes.  Reflexes: 2+ at both patellar tendons, 2+ at achilles tendons, Babinski's downgoing.  Strength at foot  Plantar-flexion: 5/5 Dorsi-flexion: 5/5 Eversion: 5/5 Inversion: 5/5  Leg strength  Quad: 5/5 Hamstring: 5/5 Hip flexor: 5/5 Hip abductors: 5/5  Gait unremarkable.  Osteopathic findings T6 extended rotated and side bent right with inhaled sixth rib    Impression and Recommendations:     This case required medical decision making of moderate complexity.

## 2014-03-23 NOTE — Assessment & Plan Note (Signed)
Decision today to treat with OMT was based on Physical Exam  After verbal consent patient was treated with HVLA, ME techniques in thoracici and rib areas  Patient tolerated the procedure well with improvement in symptoms  Patient given exercises, stretches and lifestyle modifications  See medications in patient instructions if given  Patient will follow up in 3 weeks

## 2014-03-27 ENCOUNTER — Telehealth: Payer: Self-pay | Admitting: Gastroenterology

## 2014-03-27 NOTE — Telephone Encounter (Signed)
Colonoscopy in August. No problems until today. Usually has normal bm's but today she had a smaller bm and blood with it. Her rectum has a throbbing uncomfortable feeling. She took a laxative this morning. No results yet. No active bleeding. Last took Hydrocodone on 03/23/14. Agrees to an appointment for evaluation.

## 2014-03-30 ENCOUNTER — Ambulatory Visit: Payer: 59 | Admitting: Physician Assistant

## 2014-04-02 ENCOUNTER — Other Ambulatory Visit: Payer: Self-pay | Admitting: Internal Medicine

## 2014-04-03 MED ORDER — MOMETASONE FURO-FORMOTEROL FUM 100-5 MCG/ACT IN AERO: 2.0000 | INHALATION_SPRAY | Freq: Two times a day (BID) | RESPIRATORY_TRACT | Status: DC

## 2014-04-14 ENCOUNTER — Ambulatory Visit: Payer: 59 | Admitting: Family Medicine

## 2014-04-28 ENCOUNTER — Encounter: Payer: Self-pay | Admitting: Internal Medicine

## 2014-04-29 ENCOUNTER — Encounter: Payer: Self-pay | Admitting: Internal Medicine

## 2014-05-23 ENCOUNTER — Other Ambulatory Visit: Payer: Self-pay | Admitting: Internal Medicine

## 2014-05-25 MED ORDER — MOMETASONE FURO-FORMOTEROL FUM 100-5 MCG/ACT IN AERO: 2.0000 | INHALATION_SPRAY | Freq: Two times a day (BID) | RESPIRATORY_TRACT | Status: DC

## 2014-05-25 NOTE — Telephone Encounter (Signed)
Denied,  Patient needs to follow up with sports medicine per oct 5th note.

## 2014-05-25 NOTE — Telephone Encounter (Signed)
Okay to refill Hydrocodone? Last seen in August. No future appts. Please advise. Ruthe Mannan was sent to pharmacy.

## 2014-05-26 NOTE — Telephone Encounter (Signed)
Patient notified and voiced understanding.

## 2014-06-09 ENCOUNTER — Encounter: Payer: Self-pay | Admitting: *Deleted

## 2014-06-30 ENCOUNTER — Ambulatory Visit: Payer: 59 | Admitting: Internal Medicine

## 2014-07-28 ENCOUNTER — Ambulatory Visit: Payer: 59 | Admitting: Internal Medicine

## 2014-08-05 ENCOUNTER — Emergency Department: Payer: Self-pay | Admitting: Emergency Medicine

## 2014-08-07 ENCOUNTER — Ambulatory Visit: Payer: Self-pay | Admitting: Internal Medicine

## 2014-08-10 ENCOUNTER — Telehealth: Payer: Self-pay | Admitting: Cardiovascular Disease

## 2014-08-10 ENCOUNTER — Telehealth: Payer: Self-pay | Admitting: Internal Medicine

## 2014-08-10 MED ORDER — ZOLPIDEM TARTRATE ER 12.5 MG PO TBCR: 12.5000 mg | EXTENDED_RELEASE_TABLET | Freq: Every evening | ORAL | Status: DC | PRN

## 2014-08-10 NOTE — Telephone Encounter (Signed)
Patient scheduled new patient appt. To see Dr. Rockey Situ on 08-19-14.  Per patient she was discharged from [facility] ED on 08-05-14 for Chest pain relieved by Nitroglycerin and aspirin.  Patient is still complaining of Off and on Left Sided Chest Pain.  But the last episode of Constant pain was on Wednesday when she went to the ED.

## 2014-08-10 NOTE — Telephone Encounter (Signed)
Patient requesting refill on Alprazolam 0.5 mg and on Zolpidem 12.5 mg Cr please advise ok to fill patient has an appointment scheduled 08/18/14.

## 2014-08-10 NOTE — Telephone Encounter (Signed)
The alprazolam was just filled #30 on feb 2.  So that cannot be refilled prior to appt,  The Lorrin Mais has been authoirzed

## 2014-08-10 NOTE — Telephone Encounter (Signed)
Spoke w/ Melanie Fisher.  Advised her that I am rescheduling her to see Dr. Rockey Situ sooner, on Wed 2/24 @ 8:00. Melanie Fisher is appreciative and will call back w/ any questions or concerns.

## 2014-08-11 MED ORDER — ALPRAZOLAM 0.5 MG PO TABS: ORAL_TABLET | ORAL | Status: DC

## 2014-08-11 NOTE — Telephone Encounter (Signed)
Reviewed chart last refill was 07/21/13 on alprazolam not 07/21/14 ok to fill?

## 2014-08-11 NOTE — Telephone Encounter (Signed)
Script phoned to pharmacy

## 2014-08-11 NOTE — Telephone Encounter (Signed)
Patient notified

## 2014-08-11 NOTE — Telephone Encounter (Signed)
My mistake!  Yes will authorize.

## 2014-08-12 ENCOUNTER — Ambulatory Visit (INDEPENDENT_AMBULATORY_CARE_PROVIDER_SITE_OTHER): Payer: 59 | Admitting: Cardiovascular Disease

## 2014-08-12 ENCOUNTER — Encounter: Payer: Self-pay | Admitting: Cardiovascular Disease

## 2014-08-12 VITALS — BP 152/82 | HR 58 | Ht 63.0 in | Wt 203.0 lb

## 2014-08-12 DIAGNOSIS — R011 Cardiac murmur, unspecified: Secondary | ICD-10-CM

## 2014-08-12 DIAGNOSIS — E669 Obesity, unspecified: Secondary | ICD-10-CM

## 2014-08-12 DIAGNOSIS — J439 Emphysema, unspecified: Secondary | ICD-10-CM

## 2014-08-12 DIAGNOSIS — R079 Chest pain, unspecified: Secondary | ICD-10-CM

## 2014-08-12 DIAGNOSIS — IMO0001 Reserved for inherently not codable concepts without codable children: Secondary | ICD-10-CM

## 2014-08-12 DIAGNOSIS — E785 Hyperlipidemia, unspecified: Secondary | ICD-10-CM

## 2014-08-12 DIAGNOSIS — R0602 Shortness of breath: Secondary | ICD-10-CM

## 2014-08-12 DIAGNOSIS — R0789 Other chest pain: Secondary | ICD-10-CM

## 2014-08-12 DIAGNOSIS — R61 Generalized hyperhidrosis: Secondary | ICD-10-CM

## 2014-08-12 MED ORDER — ISOSORBIDE MONONITRATE ER 30 MG PO TB24: 30.0000 mg | ORAL_TABLET | Freq: Every day | ORAL | Status: DC

## 2014-08-12 MED ORDER — NITROGLYCERIN 0.4 MG SL SUBL: 0.4000 mg | SUBLINGUAL_TABLET | SUBLINGUAL | Status: DC | PRN

## 2014-08-12 NOTE — Assessment & Plan Note (Signed)
Stable respiratory status. Stopped smoking 10 years ago

## 2014-08-12 NOTE — Assessment & Plan Note (Signed)
Currently not on a statin. Given her very strong family history, should consider low-dose statin. Will defer to primary care

## 2014-08-12 NOTE — Assessment & Plan Note (Signed)
We have encouraged continued exercise, careful diet management in an effort to lose weight. 

## 2014-08-12 NOTE — Assessment & Plan Note (Addendum)
Heart murmur appreciated likely aortic valve disease though  tricuspid valve regurgitation also possibility Echocardiogram has been ordered

## 2014-08-12 NOTE — Assessment & Plan Note (Addendum)
She reports having continued, now worsening left-sided chest pain. Very strong family history concerning for angina. 3 of her siblings have stents or bypass.  She does have a smoking history. She is unable to treadmill. Pharmacologic Myoview has been ordered to rule out ischemia She reports symptoms resolve relatively quickly with nitroglycerin sublingual. Nitroglycerin was provided to her to take when necessary After her stress test, suggested she try isosorbide mononitrate 30 mg daily. This will also help her blood pressure which is mildly elevated.

## 2014-08-12 NOTE — Patient Instructions (Addendum)
You are doing well.  We will order a Stress test for chest pain, sweating (lexiscan myoview) We will call you to schedule  After the stress test,  Start isosorbide one a day Nitro as needed  We will order an echocardiogram for murmur and chest pain  Please call us if you have new issues that need to be addressed before your next appt.    [place]  Your caregiver has ordered a Stress Test with nuclear imaging. The purpose of this test is to evaluate the blood supply to your heart muscle. This procedure is referred to as a "Non-Invasive Stress Test." This is because other than having an IV started in your vein, nothing is inserted or "invades" your body. Cardiac stress tests are done to find areas of poor blood flow to the heart by determining the extent of coronary artery disease (CAD). Some patients exercise on a treadmill, which naturally increases the blood flow to your heart, while others who are  unable to walk on a treadmill due to physical limitations have a pharmacologic/chemical stress agent called Lexiscan . This medicine will mimic walking on a treadmill by temporarily increasing your coronary blood flow.   Please note: these test may take anywhere between 2-4 hours to complete  PLEASE REPORT TO [facility] AT THE FIRST DESK WILL DIRECT YOU WHERE TO GO  Date of Procedure:_________Monday, 2/29___________  Arrival Time for Procedure:_____7:45 am________________  Instructions regarding medication:   __X__:  Hold betablocker(s) night before procedure and morning of procedure: METOPROLOL   PLEASE NOTIFY THE OFFICE AT LEAST 24 HOURS IN ADVANCE IF YOU ARE UNABLE TO KEEP YOUR APPOINTMENT.  [phone] AND  PLEASE NOTIFY NUCLEAR MEDICINE AT [facility] AT LEAST 24 HOURS IN ADVANCE IF YOU ARE UNABLE TO KEEP YOUR APPOINTMENT. [phone]  How to prepare for your Myoview test:  1. Do not eat or drink after midnight 2. No caffeine for 24 hours prior  to test 3. No smoking 24 hours prior to test. 4. Your medication may be taken with water.  If your doctor stopped a medication because of this test, do not take that medication. 5. Ladies, please do not wear dresses.  Skirts or pants are appropriate. Please wear a short sleeve shirt. 6. No perfume, cologne or lotion. 7. Wear comfortable walking shoes. No heels!

## 2014-08-12 NOTE — Progress Notes (Signed)
Patient ID: Melanie Fisher, female    DOB: 06-04-1966, 49 y.o.   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  She reports that she had a stress test over one year ago and reports that this was normal. Over the past year she reports having worsening chest pain. Sometimes at rest, sometimes with exertion. She is concerned about her symptoms given her strong family history. Symptoms do not seem to change with eating or position. Sometimes when working symptoms get worse, but not on a regular basis. Sometimes have symptoms while sleeping. No significant radiation up to her neck and left arm. Symptoms predominantly over the left chest, above the left breast  EKG shows normal sinus rhythm with rate 58 bpm, no significant ST or T-wave changes She reports that on prior stress test she was unable to treadmill. This was terminated prematurely and she was changed to pharmacologic Myoview  No Known Allergies  Outpatient Encounter Prescriptions as of 08/12/2014  Medication Sig  . albuterol (PROVENTIL HFA;VENTOLIN HFA) 108 (90 BASE) MCG/ACT inhaler Inhale 2 puffs into the lungs every 6 (six) hours as needed for wheezing or shortness of breath.  . ALPRAZolam (XANAX) 0.5 MG tablet TAKE ONE TABLET BY MOUTH DAILY AS NEEDED FOR EXTREME ANXIETY(IN ADDITION TO XR)  . buPROPion (WELLBUTRIN SR) 150 MG 12 hr tablet TAKE ONE TABLET BY MOUTH 2 TIMES A DAY  . citalopram (CELEXA) 20 MG tablet TAKE 1 TABLET BY MOUTH ONCE DAILY  . diclofenac (VOLTAREN) 75 MG EC tablet Take 1 tablet (75 mg total) by mouth 2 (two) times daily.  . methocarbamol (ROBAXIN) 500 MG tablet Take 1 tablet (500 mg total) by mouth at bedtime as needed for muscle spasms.  . metoprolol succinate (TOPROL-XL) 25 MG  24 hr tablet Take 1 tablet (25 mg total) by mouth daily. At bedtime for blood pressure/headache prevention  . mometasone-formoterol (DULERA) 100-5 MCG/ACT AERO Inhale 2 puffs into the lungs 2 (two) times daily.  . valACYclovir (VALTREX) 500 MG tablet 1 tablet daily  . zolpidem (AMBIEN CR) 12.5 MG CR tablet Take 1 tablet (12.5 mg total) by mouth at bedtime as needed. for sleep  . isosorbide mononitrate (IMDUR) 30 MG 24 hr tablet Take 1 tablet (30 mg total) by mouth daily.  . nitroGLYCERIN (NITROSTAT) 0.4 MG SL tablet Place 1 tablet (0.4 mg total) under the tongue every 5 (five) minutes as needed for chest pain.  . traMADol (ULTRAM) 50 MG tablet Take 1 tablet (50 mg total) by mouth every 6 (six) hours as needed.  . [DISCONTINUED] HYDROcodone-acetaminophen (NORCO/VICODIN) 5-325 MG per tablet Take 1 tablet by mouth every 6 (six) hours as needed for moderate pain. (Patient not taking: Reported on 08/12/2014)  . [DISCONTINUED] PARoxetine (PAXIL) 10 MG tablet Take 1 tablet (10 mg total) by mouth daily. (Patient not taking: Reported on 08/12/2014)    Past Medical History  Diagnosis Date  . Sleep apnea   . Depression   . Heart murmur   . Hyperlipidemia   . Allergy   . Asthma   . Anxiety     Past Surgical History  Procedure Laterality Date  . Abdominal hysterectomy  2008    partial  . Right toe bunionectomy    . Right wrist ganglion cyst removal  Social History  reports that she has been smoking.  She does not have any smokeless tobacco history on file. She reports that she drinks alcohol. She reports that she does not use illicit drugs.  Family History family history includes Alcohol abuse in her father; COPD in her mother; Cancer in her sister; Cancer (age of onset: 2) in her maternal uncle; Colon cancer in her maternal uncle; Diabetes in her father and sister; Heart disease in her father; Hypertension in her father; Kidney disease (age of onset: 71) in her mother. There is no history of  Esophageal cancer, Pancreatic cancer, Rectal cancer, or Stomach cancer.   Review of Systems  Constitutional: Negative.   Respiratory: Negative.   Cardiovascular: Negative.   Gastrointestinal: Negative.   Musculoskeletal: Negative.   Skin: Negative.   Neurological: Negative.   Hematological: Negative.   Psychiatric/Behavioral: Negative.   All other systems reviewed and are negative.   BP 152/82 mmHg  Pulse 58  Ht 5\' 3"  (1.6 m)  Wt 203 lb (92.08 kg)  BMI 35.97 kg/m2  Physical Exam  Constitutional: She is oriented to person, place, and time. She appears well-developed and well-nourished.  HENT:  Head: Normocephalic.  Nose: Nose normal.  Mouth/Throat: Oropharynx is clear and moist.  Eyes: Conjunctivae are normal. Pupils are equal, round, and reactive to light.  Neck: Normal range of motion. Neck supple. No JVD present.  Cardiovascular: Normal rate, regular rhythm, S1 normal, S2 normal and intact distal pulses.  Exam reveals no gallop and no friction rub.   Murmur heard.  Systolic murmur is present with a grade of 2/6  Pulmonary/Chest: Effort normal and breath sounds normal. No respiratory distress. She has no wheezes. She has no rales. She exhibits no tenderness.  Abdominal: Soft. Bowel sounds are normal. She exhibits no distension. There is no tenderness.  Musculoskeletal: Normal range of motion. She exhibits no edema or tenderness.  Lymphadenopathy:    She has no cervical adenopathy.  Neurological: She is alert and oriented to person, place, and time. Coordination normal.  Skin: Skin is warm and dry. No rash noted. No erythema.  Psychiatric: She has a normal mood and affect. Her behavior is normal. Judgment and thought content normal.    Assessment and Plan  Nursing note and vitals reviewed.

## 2014-08-14 ENCOUNTER — Telehealth: Payer: Self-pay | Admitting: *Deleted

## 2014-08-14 NOTE — Telephone Encounter (Signed)
Spoke w/ pt.  She reports that she ran into Dr. Rockey Situ on the elevator at [facility] and he suggested she try TUMS for her heartburn, but it has not helped.  Discussed w/ pt other options, such as omeprazole.   Pt sched for Lexiscan 08/17/14 @ 8:00, understands to arrive @ [facility] @ 7:45 am.

## 2014-08-14 NOTE — Telephone Encounter (Signed)
Left message for pt to call back  °

## 2014-08-14 NOTE — Telephone Encounter (Signed)
Pt is stating just stated the Isosorbide  And this is giving her bad heartburn  Is taking zantac but doesn't seem to be working.  Started yesterday. And same thing going on today

## 2014-08-17 ENCOUNTER — Other Ambulatory Visit: Payer: Self-pay

## 2014-08-17 ENCOUNTER — Ambulatory Visit: Payer: Self-pay | Admitting: Cardiovascular Disease

## 2014-08-17 DIAGNOSIS — IMO0001 Reserved for inherently not codable concepts without codable children: Secondary | ICD-10-CM

## 2014-08-17 DIAGNOSIS — R079 Chest pain, unspecified: Secondary | ICD-10-CM

## 2014-08-17 DIAGNOSIS — R0602 Shortness of breath: Secondary | ICD-10-CM

## 2014-08-18 ENCOUNTER — Encounter: Payer: Self-pay | Admitting: Internal Medicine

## 2014-08-18 ENCOUNTER — Ambulatory Visit (INDEPENDENT_AMBULATORY_CARE_PROVIDER_SITE_OTHER): Payer: 59 | Admitting: Internal Medicine

## 2014-08-18 VITALS — BP 114/68 | HR 94 | Temp 98.3°F | Resp 16 | Ht 63.0 in | Wt 202.5 lb

## 2014-08-18 DIAGNOSIS — Z716 Tobacco abuse counseling: Secondary | ICD-10-CM

## 2014-08-18 DIAGNOSIS — R0789 Other chest pain: Secondary | ICD-10-CM

## 2014-08-18 DIAGNOSIS — I10 Essential (primary) hypertension: Secondary | ICD-10-CM

## 2014-08-18 DIAGNOSIS — E785 Hyperlipidemia, unspecified: Secondary | ICD-10-CM

## 2014-08-18 MED ORDER — METOPROLOL SUCCINATE ER 25 MG PO TB24: 25.0000 mg | ORAL_TABLET | Freq: Every day | ORAL | Status: DC

## 2014-08-18 MED ORDER — ZOLPIDEM TARTRATE ER 12.5 MG PO TBCR: 12.5000 mg | EXTENDED_RELEASE_TABLET | Freq: Every evening | ORAL | Status: DC | PRN

## 2014-08-18 MED ORDER — BENZONATATE 200 MG PO CAPS: 200.0000 mg | ORAL_CAPSULE | Freq: Three times a day (TID) | ORAL | Status: DC | PRN

## 2014-08-18 NOTE — Progress Notes (Signed)
Pre-visit discussion using our clinic review tool. No additional management support is needed unless otherwise documented below in the visit note.  

## 2014-08-18 NOTE — Progress Notes (Signed)
Patient ID: Melanie Fisher, female   DOB: [birth year], 49 y.o.   MRN: [id number]  Patient Active Problem List   Diagnosis Date Noted  . Slipped rib syndrome 03/23/2014  . Degenerative disc disease, thoracic 03/23/2014  . Nonallopathic lesion of thoracic region 03/23/2014  . Nonallopathic lesion-rib cage 03/23/2014  . Degenerative disk disease 02/12/2014  . Personal history of colonic polyps 02/11/2014  . Pain, pelvic, female 01/05/2014  . Headache above the eye region 11/18/2013  . Other chest pain 11/18/2013  . Obesity 07/01/2013  . Tobacco abuse counseling 03/15/2013  . Rash 02/18/2013  . Routine general medical examination at a health care facility 09/04/2012  . COPD (chronic obstructive pulmonary disease) 05/26/2012  . Chronic shoulder pain 04/09/2012  . Generalized anxiety disorder 04/06/2012  . Tobacco abuse 04/06/2012  . Depression   . Heart murmur   . Hyperlipidemia   . Allergy   . COPD exacerbation     Subjective:  CC:   Chief Complaint  Patient presents with  . Follow-up    medication    HPI:   Melanie Fisher is a 49 y.o. female who presents for Follow up on thoracic back pain secondary to DDD by plain films.  She was last seen in August  And referred to Charlann Boxer and treated for T6 slipped rib syndrome .  Her back pain improved after zach smith popped her rib back in place .   On  Feb 27th she was  treated in ED for chest pain and hypertensive urgency that responded to NTG.  She had a chemical tress test yesterday  And reports that during the procedure she had recurrence of chest pain and dyspnea. Results were pending at the time of her visit with me.     Has been having a productive cough and sneezing for several days. Denies fevers, myalgias and sore throat.   Past Medical History  Diagnosis Date  . Sleep apnea   . Depression   . Heart murmur   . Hyperlipidemia   . Allergy   . Asthma   . Anxiety     Past Surgical History  Procedure  Laterality Date  . Abdominal hysterectomy  2008    partial  . Right toe bunionectomy    . Right wrist ganglion cyst removal         The following portions of the patient's history were reviewed and updated as appropriate: Allergies, current medications, and problem list.    Review of Systems:   Patient denies headache, fevers, malaise, unintentional weight loss, skin rash, eye pain, sinus congestion and sinus pain, sore throat, dysphagia,  hemoptysis , cough, dyspnea, wheezing, chest pain, palpitations, orthopnea, edema, abdominal pain, nausea, melena, diarrhea, constipation, flank pain, dysuria, hematuria, urinary  Frequency, nocturia, numbness, tingling, seizures,  Focal weakness, Loss of consciousness,  Tremor, insomnia, depression, anxiety, and suicidal ideation.     History   Social History  . Marital Status: Married    Spouse Name: N/A  . Number of Children: N/A  . Years of Education: N/A   Occupational History  . Not on file.   Social History Main Topics  . Smoking status: Current Every Day Smoker -- 1.50 packs/day  . Smokeless tobacco: Not on file  . Alcohol Use: Yes     Comment: occasionally  . Drug Use: No  . Sexual Activity: Not Currently   Other Topics Concern  . Not on file   Social History Narrative    Objective:  Filed Vitals:   08/18/14 1547  BP: 114/68  Pulse: 94  Temp: 98.3 F (36.8 C)  Resp: 16     General appearance: alert, cooperative and appears stated age Ears: normal TM's and external ear canals both ears Throat: lips, mucosa, and tongue normal; teeth and gums normal Neck: no adenopathy, no carotid bruit, supple, symmetrical, trachea midline and thyroid not enlarged, symmetric, no tenderness/mass/nodules Back: symmetric, no curvature. ROM normal. No CVA tenderness. Lungs: clear to auscultation bilaterally Heart: regular rate and rhythm, S1, S2 normal, no murmur, click, rub or gallop Abdomen: soft, non-tender; bowel sounds normal;  no masses,  no organomegaly Pulses: 2+ and symmetric Skin: Skin color, texture, turgor normal. No rashes or lesions Lymph nodes: Cervical, supraclavicular, and axillary nodes normal.  Assessment and Plan:   Problem List Items Addressed This Visit    Tobacco abuse counseling    Smoking cessation instruction/counseling given:  counseled patient on the dangers of tobacco use, advised patient to stop smoking, and reviewed strategies to maximize success. She is motivated to quit.  Chantix prescribed.        Other chest pain    Her stress test was negative for ischemia. We will modify her cardiac risk factors which inluding  Hyperlipidimeia,  Hypertension,  and tobacco abuse. Trial of PPI for chest pain that was likely esophagitis      Relevant Medications   omeprazole (PRILOSEC) capsule   Hyperlipidemia - Primary    Based on current lipid profile, the risk of clinically significant CAD is 7.5% over the next 10 years, using the Framingham risk calculator. The [employer] recommends starting patients aged 64 or higher on moderate intensity statin therapy for LDL between 70-189 and 10 yr risk of CAD > 7.5% ;  Will initiate trial of simvastatin   Lab Results  Component Value Date   CHOL 189 08/18/2014   HDL 46.90 08/18/2014   LDLCALC 121* 08/18/2014   TRIG 107.0 08/18/2014   CHOLHDL 4 08/18/2014         Relevant Medications   metoprolol succinate (TOPROL-XL) 24 hr tablet   simvastatin (ZOCOR) tablet   Other Relevant Orders   Comprehensive metabolic panel (Completed)   Lipid panel (Completed)   Essential hypertension    continue metoprolol and imdur       Relevant Medications   metoprolol succinate (TOPROL-XL) 24 hr tablet   simvastatin (ZOCOR) tablet

## 2014-08-18 NOTE — Patient Instructions (Addendum)
You can resume toprol at bedtime and continue Imdur in the morning  Try to reduce your cigarettes every week by once cigarette per day IF YOU CANT STOP COLD [place]

## 2014-08-19 ENCOUNTER — Encounter: Payer: Self-pay | Admitting: Internal Medicine

## 2014-08-19 ENCOUNTER — Ambulatory Visit: Payer: 59 | Admitting: Cardiovascular Disease

## 2014-08-19 ENCOUNTER — Other Ambulatory Visit: Payer: Self-pay | Admitting: Internal Medicine

## 2014-08-19 DIAGNOSIS — I10 Essential (primary) hypertension: Secondary | ICD-10-CM | POA: Insufficient documentation

## 2014-08-19 LAB — COMPREHENSIVE METABOLIC PANEL
ALT: 12 U/L (ref 0–35)
AST: 15 U/L (ref 0–37)
Albumin: 4.2 g/dL (ref 3.5–5.2)
Alkaline Phosphatase: 64 U/L (ref 39–117)
BUN: 16 mg/dL (ref 6–23)
CO2: 28 mEq/L (ref 19–32)
Calcium: 9.5 mg/dL (ref 8.4–10.5)
Chloride: 107 mEq/L (ref 96–112)
Creatinine, Ser: 0.82 mg/dL (ref 0.40–1.20)
GFR: 78.42 mL/min (ref >60.00)
Glucose, Bld: 95 mg/dL (ref 70–99)
Potassium: 4.2 mEq/L (ref 3.5–5.1)
Sodium: 139 mEq/L (ref 135–145)
Total Bilirubin: 0.3 mg/dL (ref 0.2–1.2)
Total Protein: 7.3 g/dL (ref 6.0–8.3)

## 2014-08-19 LAB — LIPID PANEL
Cholesterol: 189 mg/dL (ref 0–200)
HDL: 46.9 mg/dL (ref >39.00)
LDL Cholesterol: 121 mg/dL — ABNORMAL HIGH (ref 0–99)
NonHDL: 142.1
Total CHOL/HDL Ratio: 4
Triglycerides: 107 mg/dL (ref 0.0–149.0)
VLDL: 21.4 mg/dL (ref 0.0–40.0)

## 2014-08-19 MED ORDER — SIMVASTATIN 20 MG PO TABS: 20.0000 mg | ORAL_TABLET | Freq: Every day | ORAL | Status: DC

## 2014-08-19 MED ORDER — VARENICLINE TARTRATE 0.5 MG X 11 & 1 MG X 42 PO MISC: ORAL | Status: DC

## 2014-08-19 MED ORDER — OMEPRAZOLE 40 MG PO CPDR: 40.0000 mg | DELAYED_RELEASE_CAPSULE | Freq: Every day | ORAL | Status: DC

## 2014-08-19 NOTE — Assessment & Plan Note (Signed)
Smoking cessation instruction/counseling given:  counseled patient on the dangers of tobacco use, advised patient to stop smoking, and reviewed strategies to maximize success. She is motivated to quit.  Chantix prescribed.

## 2014-08-19 NOTE — Assessment & Plan Note (Signed)
continue metoprolol and imdur

## 2014-08-19 NOTE — Assessment & Plan Note (Signed)
Based on current lipid profile, the risk of clinically significant CAD is 7.5% over the next 10 years, using the Framingham risk calculator. The [employer] recommends starting patients aged 51 or higher on moderate intensity statin therapy for LDL between 70-189 and 10 yr risk of CAD > 7.5% ;  Will initiate trial of simvastatin   Lab Results  Component Value Date   CHOL 189 08/18/2014   HDL 46.90 08/18/2014   LDLCALC 121* 08/18/2014   TRIG 107.0 08/18/2014   CHOLHDL 4 08/18/2014

## 2014-08-19 NOTE — Assessment & Plan Note (Addendum)
Her stress test was negative for ischemia. We will modify her cardiac risk factors which inluding  Hyperlipidimeia,  Hypertension,  and tobacco abuse. Trial of PPI for chest pain that was likely esophagitis

## 2014-08-27 ENCOUNTER — Other Ambulatory Visit: Payer: Self-pay

## 2014-08-27 ENCOUNTER — Other Ambulatory Visit (INDEPENDENT_AMBULATORY_CARE_PROVIDER_SITE_OTHER): Payer: 59

## 2014-08-27 DIAGNOSIS — R0602 Shortness of breath: Secondary | ICD-10-CM

## 2014-08-27 DIAGNOSIS — R011 Cardiac murmur, unspecified: Secondary | ICD-10-CM

## 2014-08-27 DIAGNOSIS — R079 Chest pain, unspecified: Secondary | ICD-10-CM

## 2014-09-13 ENCOUNTER — Encounter: Payer: Self-pay | Admitting: Internal Medicine

## 2014-09-15 ENCOUNTER — Other Ambulatory Visit: Payer: Self-pay | Admitting: Internal Medicine

## 2014-09-15 MED ORDER — TRAMADOL HCL 50 MG PO TABS: 50.0000 mg | ORAL_TABLET | Freq: Two times a day (BID) | ORAL | Status: DC | PRN

## 2014-09-15 NOTE — Progress Notes (Signed)
Script faxed.

## 2014-09-18 ENCOUNTER — Other Ambulatory Visit: Payer: Self-pay | Admitting: Internal Medicine

## 2014-10-05 ENCOUNTER — Encounter: Payer: Self-pay | Admitting: Internal Medicine

## 2014-10-08 ENCOUNTER — Other Ambulatory Visit: Payer: Self-pay | Admitting: Internal Medicine

## 2014-10-09 ENCOUNTER — Other Ambulatory Visit: Payer: Self-pay | Admitting: Internal Medicine

## 2014-10-09 MED ORDER — ALPRAZOLAM 0.5 MG PO TABS: ORAL_TABLET | ORAL | Status: DC

## 2014-10-12 NOTE — Telephone Encounter (Signed)
Faxed to pharmacy this morning.

## 2014-10-12 NOTE — Telephone Encounter (Signed)
Rx faxed to [facility] pharmacy 

## 2014-10-16 ENCOUNTER — Ambulatory Visit: Payer: 59 | Admitting: Internal Medicine

## 2014-10-19 ENCOUNTER — Ambulatory Visit: Payer: 59 | Admitting: Internal Medicine

## 2014-10-20 ENCOUNTER — Ambulatory Visit (INDEPENDENT_AMBULATORY_CARE_PROVIDER_SITE_OTHER): Payer: 59 | Admitting: Internal Medicine

## 2014-10-20 ENCOUNTER — Telehealth: Payer: Self-pay | Admitting: *Deleted

## 2014-10-20 DIAGNOSIS — M5134 Other intervertebral disc degeneration, thoracic region: Secondary | ICD-10-CM

## 2014-10-20 NOTE — Telephone Encounter (Signed)
She will still be charged,  Because it was a 30 minute visit.  [place],  She will no longer  Be booked for 30 minute visits unless it is a physical.,

## 2014-10-20 NOTE — Telephone Encounter (Signed)
Patient called to cancel appointment today, for 11.15 am patient called at 8.37 am please advise by Note. Patient stated family emergency.

## 2014-10-22 NOTE — Assessment & Plan Note (Signed)
Patient cancelled on day of appt and will be billed a no show fee.

## 2014-10-22 NOTE — Progress Notes (Signed)
Patient ID: Melanie Fisher, female   DOB: 1965/07/17, 49 y.o.   MRN: [id number]  Patient cancelled on the day of appointment

## 2014-10-27 ENCOUNTER — Encounter: Payer: Self-pay | Admitting: Internal Medicine

## 2014-10-28 MED ORDER — VALACYCLOVIR HCL 500 MG PO TABS: 500.0000 mg | ORAL_TABLET | Freq: Three times a day (TID) | ORAL | Status: DC

## 2014-11-03 ENCOUNTER — Ambulatory Visit (INDEPENDENT_AMBULATORY_CARE_PROVIDER_SITE_OTHER): Payer: 59 | Admitting: Internal Medicine

## 2014-11-03 ENCOUNTER — Encounter: Payer: Self-pay | Admitting: Internal Medicine

## 2014-11-03 VITALS — BP 118/74 | HR 77 | Temp 98.7°F | Resp 16 | Ht 63.0 in | Wt 201.2 lb

## 2014-11-03 DIAGNOSIS — Z716 Tobacco abuse counseling: Secondary | ICD-10-CM | POA: Diagnosis not present

## 2014-11-03 DIAGNOSIS — F329 Major depressive disorder, single episode, unspecified: Secondary | ICD-10-CM | POA: Diagnosis not present

## 2014-11-03 DIAGNOSIS — F32A Depression, unspecified: Secondary | ICD-10-CM

## 2014-11-03 DIAGNOSIS — Z720 Tobacco use: Secondary | ICD-10-CM

## 2014-11-03 DIAGNOSIS — F411 Generalized anxiety disorder: Secondary | ICD-10-CM

## 2014-11-03 DIAGNOSIS — E785 Hyperlipidemia, unspecified: Secondary | ICD-10-CM

## 2014-11-03 DIAGNOSIS — I10 Essential (primary) hypertension: Secondary | ICD-10-CM

## 2014-11-03 MED ORDER — ISOSORBIDE MONONITRATE ER 30 MG PO TB24: 30.0000 mg | ORAL_TABLET | Freq: Every day | ORAL | Status: DC

## 2014-11-03 MED ORDER — NICOTINE 21 MG/24HR TD PT24: 21.0000 mg | MEDICATED_PATCH | Freq: Every day | TRANSDERMAL | Status: DC

## 2014-11-03 MED ORDER — ATORVASTATIN CALCIUM 20 MG PO TABS: 20.0000 mg | ORAL_TABLET | Freq: Every day | ORAL | Status: DC

## 2014-11-03 MED ORDER — CITALOPRAM HYDROBROMIDE 20 MG PO TABS: 20.0000 mg | ORAL_TABLET | Freq: Every day | ORAL | Status: DC

## 2014-11-03 NOTE — Progress Notes (Signed)
Patient ID: Melanie Fisher, female   DOB: 30-Mar-1966, 49 y.o.   MRN: [id number]

## 2014-11-03 NOTE — Progress Notes (Signed)
Pre-visit discussion using our clinic review tool. No additional management support is needed unless otherwise documented below in the visit note.  

## 2014-11-03 NOTE — Patient Instructions (Signed)
I'm glad you are OK!   Please try to quit smoking again when your nerves have settled down, use the patches I sent to the pharmacy  I changed your cholesterol medicaiton to atorvastatin,  You cna finish the simvastatin first.   Return for fasting  labs in 3 months ,  OV in 6

## 2014-11-05 ENCOUNTER — Encounter: Payer: Self-pay | Admitting: Internal Medicine
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
## 2014-11-05 NOTE — Assessment & Plan Note (Addendum)
Based on current lipid profile, the risk of clinically significant CAD is 7.5% over the next 10 years, using the Framingham risk calculator. The [employer] recommends starting patients aged 50 or higher on moderate intensity statin therapy for LDL between 70-189 and 10 yr risk of CAD > 7.5% ;  She is tolerating a trial of  simvastatin but given her continued tobacco abuse ill upgrade to atorvastatin Lab Results  Component Value Date   CHOL 189 08/18/2014   HDL 46.90 08/18/2014   LDLCALC 121* 08/18/2014   TRIG 107.0 08/18/2014   CHOLHDL 4 08/18/2014
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
## 2014-11-05 NOTE — Assessment & Plan Note (Signed)
Smoking cessation instruction/counseling given:  counseled patient on the dangers of tobacco use, advised patient to stop smoking, and reviewed strategies to maximize success. She is motivated to quit.  Chantix not tolerated,   wellbutrin was ineffective but managing her depression.  Advised to try nicoderm patches.

## 2014-11-05 NOTE — Assessment & Plan Note (Signed)
Well controlled on current regimen. Renal function stable, no changes today. 

## 2014-11-05 NOTE — Assessment & Plan Note (Signed)
She did not tolerate  Paxil for management of GAD.  Continue citalopram and prn alprazolam

## 2014-11-05 NOTE — Assessment & Plan Note (Signed)
Trial of chantix was initially tolerated but then not due to vivid dreams.  Now smoking 2-3 daily.

## 2014-11-13 ENCOUNTER — Telehealth: Payer: Self-pay | Admitting: Internal Medicine

## 2014-11-13 MED ORDER — PREDNISONE 10 MG PO TABS: ORAL_TABLET | ORAL | Status: DC

## 2014-11-13 NOTE — Telephone Encounter (Signed)
I do not refill tussionex without being seen I can add prednisone taper to help with the inflammation. rx sent  Continue tessalon perles  She is not taking amlodipine

## 2014-11-13 NOTE — Telephone Encounter (Signed)
Pt called wanting to see if she could have something called in to help with her coughing that is keeping her up at night. On 11/10/14 she went into a walk-in clinic where she was told she had a sinus infection. The doctor gave her amoxicillin, Flonase, and pearls. Best contact # [phone] 11/13/14 maf

## 2014-11-13 NOTE — Telephone Encounter (Signed)
Patient requesting refill Tussinex Please advise. Patient seen at urgent care on 11/12/14 was given tessalon patient not helping her cough.

## 2014-11-13 NOTE — Telephone Encounter (Signed)
Patient notified and voiced understanding.

## 2014-11-13 NOTE — Telephone Encounter (Signed)
Pt called back and also needs a emergency Rx of amlodipine 11/13/14 maf

## 2014-11-25 ENCOUNTER — Telehealth: Payer: Self-pay | Admitting: *Deleted

## 2014-11-25 NOTE — Telephone Encounter (Signed)
She can take the mobic

## 2014-11-25 NOTE — Telephone Encounter (Signed)
Spoke with pt, advised it is ok to take the Mobic.  Pt verbalized understanding

## 2014-11-25 NOTE — Telephone Encounter (Signed)
Pt called requesting permission to take Mobic that was prescribed by [facility] due to Varicose Veins.  Pt states she was advised not to take ibuprofen in the past.  Please advise

## 2014-12-09 ENCOUNTER — Ambulatory Visit
Admission: RE | Admit: 2014-12-09 | Discharge: 2014-12-09 | Disposition: A | Discharge status: Home / Self Care | Payer: 59 | Source: Ambulatory Visit | Attending: Family Medicine | Admitting: Family Medicine

## 2014-12-09 ENCOUNTER — Other Ambulatory Visit: Payer: Self-pay | Admitting: Family Medicine

## 2014-12-09 ENCOUNTER — Ambulatory Visit
Admission: RE | Admit: 2014-12-09 | Discharge: 2014-12-09 | Disposition: A | Discharge status: Home / Self Care | Payer: PRIVATE HEALTH INSURANCE | Source: Ambulatory Visit | Attending: Family Medicine | Admitting: Family Medicine

## 2014-12-09 DIAGNOSIS — M47892 Other spondylosis, cervical region: Secondary | ICD-10-CM | POA: Diagnosis not present

## 2014-12-09 DIAGNOSIS — M8580 Other specified disorders of bone density and structure, unspecified site: Secondary | ICD-10-CM | POA: Insufficient documentation

## 2014-12-09 DIAGNOSIS — M542 Cervicalgia: Secondary | ICD-10-CM | POA: Diagnosis present

## 2014-12-09 DIAGNOSIS — R202 Paresthesia of skin: Secondary | ICD-10-CM

## 2014-12-18 ENCOUNTER — Telehealth: Payer: Self-pay | Admitting: Internal Medicine

## 2014-12-18 NOTE — Telephone Encounter (Signed)
Pt called asking about her referral to sports medicine. But there is no order in the system. Please advise pt.[name]

## 2014-12-18 NOTE — Telephone Encounter (Signed)
Pt aware of appoint

## 2014-12-25 ENCOUNTER — Encounter: Payer: Self-pay | Admitting: Family Medicine

## 2014-12-25 ENCOUNTER — Ambulatory Visit (INDEPENDENT_AMBULATORY_CARE_PROVIDER_SITE_OTHER): Payer: 59 | Admitting: Family Medicine

## 2014-12-25 VITALS — BP 124/74 | HR 75 | Ht 63.0 in | Wt 196.0 lb

## 2014-12-25 DIAGNOSIS — M5010 Cervical disc disorder with radiculopathy, unspecified cervical region: Secondary | ICD-10-CM | POA: Insufficient documentation

## 2014-12-25 MED ORDER — GABAPENTIN 100 MG PO CAPS: 200.0000 mg | ORAL_CAPSULE | Freq: Every day | ORAL | Status: DC

## 2014-12-25 MED ORDER — VITAMIN D (ERGOCALCIFEROL) 1.25 MG (50000 UNIT) PO CAPS: 50000.0000 [IU] | ORAL_CAPSULE | ORAL | Status: DC

## 2014-12-25 NOTE — Progress Notes (Signed)
Pre visit review using our clinic review tool, if applicable. No additional management support is needed unless otherwise documented below in the visit note. 

## 2014-12-25 NOTE — Patient Instructions (Addendum)
Good to see you Ice 20 minutes 2 times daily. Usually after activity and before bed. Exercises 3 times a week. We will focus on upper back.  Gabapentin at night 100mg  for first week then 200mg  thereafter Vitamin D weekly PT will be calling you Call me in 2 weeks and if not better we will get MRI, if schedule MRI I would like to see you 1-2 days after the MRI Otherwise see me in 4 weeks.  Standing:  Secure a rubber exercise band/tubing so that it is at the height of your shoulders when you are either standing or sitting on a firm arm-less chair.  Grasp an end of the band/tubing in each hand and have your palms face each other. Straighten your elbows and lift your hands straight in front of you at shoulder height. Step back away from the secured end of band/tubing until it becomes tense.  Squeeze your shoulder blades together. Keeping your elbows locked and your hands at shoulder-height, bring your hands out to your side.  Hold __________ seconds. Slowly ease the tension on the band/tubing as you reverse the directions and return to the starting position. Repeat __________ times. Complete this exercise __________ times per day. STRENGTH - Scapular Retractors  Secure a rubber exercise band/tubing so that it is at the height of your shoulders when you are either standing or sitting on a firm arm-less chair.  With a palm-down grip, grasp an end of the band/tubing in each hand. Straighten your elbows and lift your hands straight in front of you at shoulder height. Step back away from the secured end of band/tubing until it becomes tense.  Squeezing your shoulder blades together, draw your elbows back as you bend them. Keep your upper arm lifted away from your body throughout the exercise.  Hold __________ seconds. Slowly ease the tension on the band/tubing as you reverse the directions and return to the starting position. Repeat __________ times. Complete this exercise __________ times per  day. STRENGTH - Shoulder Extensors   Secure a rubber exercise band/tubing so that it is at the height of your shoulders when you are either standing or sitting on a firm arm-less chair.  With a thumbs-up grip, grasp an end of the band/tubing in each hand. Straighten your elbows and lift your hands straight in front of you at shoulder height. Step back away from the secured end of band/tubing until it becomes tense.  Squeezing your shoulder blades together, pull your hands down to the sides of your thighs. Do not allow your hands to go behind you.  Hold for __________ seconds. Slowly ease the tension on the band/tubing as you reverse the directions and return to the starting position. Repeat __________ times. Complete this exercise __________ times per day.  STRENGTH - Scapular Retractors and External Rotators  Secure a rubber exercise band/tubing so that it is at the height of your shoulders when you are either standing or sitting on a firm arm-less chair.  With a palm-down grip, grasp an end of the band/tubing in each hand. Bend your elbows 90 degrees and lift your elbows to shoulder height at your sides. Step back away from the secured end of band/tubing until it becomes tense.  Squeezing your shoulder blades together, rotate your shoulder so that your upper arm and elbow remain stationary, but your fists travel upward to head-height.  Hold __________ for seconds. Slowly ease the tension on the band/tubing as you reverse the directions and return to the starting position.  Repeat __________ times. Complete this exercise __________ times per day.  STRENGTH - Scapular Retractors and External Rotators, Rowing  Secure a rubber exercise band/tubing so that it is at the height of your shoulders when you are either standing or sitting on a firm arm-less chair.  With a palm-down grip, grasp an end of the band/tubing in each hand. Straighten your elbows and lift your hands straight in front of you  at shoulder height. Step back away from the secured end of band/tubing until it becomes tense.  Step 1: Squeeze your shoulder blades together. Bending your elbows, draw your hands to your chest as if you are rowing a boat. At the end of this motion, your hands and elbow should be at shoulder-height and your elbows should be out to your sides.  Step 2: Rotate your shoulder to raise your hands above your head. Your forearms should be vertical and your upper-arms should be horizontal.  Hold for __________ seconds. Slowly ease the tension on the band/tubing as you reverse the directions and return to the starting position. Repeat __________ times. Complete this exercise __________ times per day.  STRENGTH - Scapular Retractors and Elevators  Secure a rubber exercise band/tubing so that it is at the height of your shoulders when you are either standing or sitting on a firm arm-less chair.  With a thumbs-up grip, grasp an end of the band/tubing in each hand. Step back away from the secured end of band/tubing until it becomes tense.  Squeezing your shoulder blades together, straighten your elbows and lift your hands straight over your head.  Hold for __________ seconds. Slowly ease the tension on the band/tubing as you reverse the directions and return to the starting position. Repeat __________ times. Complete this exercise __________ times per day.  Document Released: 06/05/2005 Document Revised: 08/28/2011 Document Reviewed: 09/17/2008 [facility] Patient Information 2015 [place], [place]. This information is not intended to replace advice given to you by your health care provider. Make sure you discuss any questions you have with your health care provider.
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
## 2014-12-25 NOTE — Assessment & Plan Note (Signed)
Patient is having radicular symptoms and likely had an exacerbation of an underlying condition when patient hadn't the injury at work. I believe that patient's needs to take vitamin D with her history of low vitamin D levels, patient is given gabapentin 200 mg at night see if we can help. Patient will be sent to formal physical therapy as well. Patient knows if any weakness occurs in the hands we will consider advanced imaging. Patient will elected try conservative therapy and exhaust all options before any surgical intervention which I agree with. This is why we will hold on advance imaging at this time. Patient has been working and will continue to do so.

## 2014-12-31 ENCOUNTER — Ambulatory Visit: Payer: Self-pay | Admitting: Internal Medicine

## 2015-01-05 ENCOUNTER — Ambulatory Visit: Payer: PRIVATE HEALTH INSURANCE | Attending: Family | Admitting: Physical Therapy

## 2015-01-05 ENCOUNTER — Encounter: Payer: Self-pay | Admitting: Physical Therapy

## 2015-01-05 DIAGNOSIS — M62838 Other muscle spasm: Secondary | ICD-10-CM | POA: Insufficient documentation

## 2015-01-05 DIAGNOSIS — M542 Cervicalgia: Secondary | ICD-10-CM | POA: Diagnosis not present

## 2015-01-06 ENCOUNTER — Other Ambulatory Visit: Payer: Self-pay | Admitting: Internal Medicine
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
## 2015-01-14 ENCOUNTER — Encounter: Payer: Self-pay | Admitting: Physical Therapy

## 2015-01-14 ENCOUNTER — Ambulatory Visit: Payer: PRIVATE HEALTH INSURANCE | Admitting: Physical Therapy

## 2015-01-14 DIAGNOSIS — M62838 Other muscle spasm: Secondary | ICD-10-CM

## 2015-01-14 DIAGNOSIS — M542 Cervicalgia: Secondary | ICD-10-CM | POA: Diagnosis not present

## 2015-01-15 NOTE — Therapy (Signed)
[facility] [address] [place], [address] Phone: [phone]   Fax:  [phone]  Physical Therapy Treatment  Patient Details  Name: Melanie Fisher MRN: [id number] Date of Birth: January 31, 1966 Referring Provider:  Blima Singer, FNP  Encounter Date: 01/14/2015      PT End of Session - 01/14/15 1645    Visit Number 2   Number of Visits 6   Date for PT Re-Evaluation 01/27/15   PT Start Time 0321   PT Stop Time 1645   PT Time Calculation (min) 59 min   Activity Tolerance Patient tolerated treatment well   Behavior During Therapy [facility] for tasks assessed/performed      Past Medical History  Diagnosis Date  . Sleep apnea   . Depression   . Heart murmur   . Hyperlipidemia   . Allergy   . Asthma   . Anxiety   . Tobacco abuse     Past Surgical History  Procedure Laterality Date  . Abdominal hysterectomy  2008    partial  . Right toe bunionectomy    . Right wrist ganglion cyst removal      There were no vitals filed for this visit.  Visit Diagnosis:  Cervicalgia  Spasm of muscle      Subjective Assessment - 01/14/15 1614    Subjective Patient reports she is currrently having tingling into both arms and hands and up into neck and radiates into both shoulders. She also has spasms in neck as well. She is having increased pain/spasms in left side> right side today. She is able to move neck better since previous session and is exercising as instructed.    Currently in Pain? Yes   Pain Score 6    Pain Location Neck  radiating tingling into both hands, left >right   Pain Orientation Other (Comment)  right and left upper trapizius   Pain Descriptors / Indicators Tightness;Tingling;Spasm   Pain Type Acute pain   Pain Onset More than a month ago   Pain Frequency Intermittent      Objective: Cervical spine AROM: pre treatment: flexion 40, extension 45, lateral flexion right and left 35 (  worse on left with LF to right), Rotation left 50, right 55 (improved from previous session by ~5 degrees) Palpation: increased spasms both upper trapezius muscles left>right       [facility] Adult PT Treatment/Exercise - 01/14/15 0001    Modalities   Modalities Electrical Stimulation;Moist Heat   Moist Heat Therapy   Number Minutes Moist Heat 20 Minutes   Moist Heat/electrical stimulation Location/Goals Cervical spine/shoulder region bilaterally with high volt estim applied to cervical spine/upper trapezius muscles continuous mode for muscle spasms Pain/muscle spasm reduction    Manual Therapy   Manual Therapy Soft tissue mobilization cervical spine and both upper trapezius muscles with patient seated in chair     Exercises: instructed in scapular adduction and shoulder extension to front of hips for stabilization and strengthening: verbal instructions and demonstration given with patient returning demonstration with verbal cues, re assessed chin tucks and scapular adduction without resistive bands   Patient response to treatment: significant decrease in spasms and symptoms into UE's and able to return demonstration of exercises with minimal verbal cuing        PT Education - 01/14/15 1645    Education provided Yes   Education Details instructed in use of resistive bands for strengthening scapualr adductin and shoulder extension to hips  Person(s) Educated Patient   Methods Explanation;Demonstration;Handout   Comprehension Verbalized understanding;Returned demonstration;Verbal cues required             PT Long Term Goals - 01/05/15 1700    PT LONG TERM GOAL #1   Title Patient will demonstrate improvement with function with decreased cervical spine pain and limitations as indicated by NDI score of 10% or less by 01/27/2015   Baseline NDI = 22% ( 0 = no self perceived disability)   Status New   PT LONG TERM GOAL #2   Title Patient will demonstrate improved function with use of  right UE as indicated by quickDash score of 25% or better work module and <5% for daily tasks by 01/27/2015   Baseline Quick Dash work 50%, daily function 11%   Status New   PT LONG TERM GOAL #3   Title Patient will demonstrate good understanding of posture awareness and be independent with home exercise program without cuing by 01/27/2015 in order to self manage symptoms   Baseline limited knoweldge of pain control, posture awareness and progression of appropriate exercises   Status New   PT LONG TERM GOAL #4   Title Patient will have cervical spine ROM WNL's and pain free or mild pain in order to be able to perform daily activities with less difficulty by 01/27/2015   Baseline cervical spine extension 35 (painful), rotation left 35, right 45   Status New               Plan - 01/14/15 1645    Clinical Impression Statement Patient demonstrated decreased spasms to mild and pain to 2-3/10 with treatment. She continues with symptoms of spasms and tingling into both UEs and will benefit from additional physical therapy intervention to address this in order for return to prior level of function without difficulty/pain.   Pt will benefit from skilled therapeutic intervention in order to improve on the following deficits Pain;Increased muscle spasms;Impaired flexibility;Decreased range of motion   Rehab Potential Good   PT Frequency 2x / week   PT Duration 3 weeks   PT Treatment/Interventions Patient/family education;Ultrasound;Moist Heat;Electrical Stimulation;Therapeutic exercise;Cryotherapy;Manual techniques   PT Next Visit Plan pain control, reduce spasms cervical spine, progress ROM/exercises   PT Home Exercise Plan posture, ROM and strengthening with resistive bands/handout given        Problem List Patient Active Problem List   Diagnosis Date Noted  . Cervical disc disorder with radiculopathy of cervical region 12/25/2014  . Essential hypertension 08/19/2014  . Slipped rib  syndrome 03/23/2014  . Degenerative disc disease, thoracic 03/23/2014  . Nonallopathic lesion of thoracic region 03/23/2014  . Nonallopathic lesion-rib cage 03/23/2014  . Degenerative disk disease 02/12/2014  . Personal history of colonic polyps 02/11/2014  . Pain, pelvic, female 01/05/2014  . Headache above the eye region 11/18/2013  . Other chest pain 11/18/2013  . Obesity 07/01/2013  . Tobacco abuse counseling 03/15/2013  . Routine general medical examination at a health care facility 09/04/2012  . COPD (chronic obstructive pulmonary disease) 05/26/2012  . Chronic shoulder pain 04/09/2012  . Generalized anxiety disorder 04/06/2012  . Tobacco abuse 04/06/2012  . Depression   . Heart murmur   . Hyperlipidemia   . Allergy     Jomarie Longs PT 01/15/2015, 7:38 AM  [facility] [address] [place], [address] Phone: [phone]   Fax:  [phone]

## 2015-01-18 ENCOUNTER — Ambulatory Visit: Payer: PRIVATE HEALTH INSURANCE | Attending: Family | Admitting: Physical Therapy

## 2015-01-18 ENCOUNTER — Encounter: Payer: Self-pay | Admitting: Physical Therapy

## 2015-01-18 DIAGNOSIS — M62838 Other muscle spasm: Secondary | ICD-10-CM | POA: Diagnosis present

## 2015-01-18 DIAGNOSIS — M542 Cervicalgia: Secondary | ICD-10-CM | POA: Insufficient documentation

## 2015-01-19 NOTE — Therapy (Signed)
[facility] [address], [place], [address] Phone: [phone]   Fax:  [phone]  Physical Therapy Treatment  Patient Details  Name: Melanie Fisher MRN: [id number] Date of Birth: 1966/01/04 Referring Provider:  Blima Singer, FNP  Encounter Date: 01/18/2015      PT End of Session - 01/18/15 1630    Visit Number 3   Number of Visits 6   Date for PT Re-Evaluation 01/27/15   PT Start Time 1540   PT Stop Time 1630   PT Time Calculation (min) 50 min   Activity Tolerance Patient tolerated treatment well   Behavior During Therapy [facility] for tasks assessed/performed      Past Medical History  Diagnosis Date  . Sleep apnea   . Depression   . Heart murmur   . Hyperlipidemia   . Allergy   . Asthma   . Anxiety   . Tobacco abuse     Past Surgical History  Procedure Laterality Date  . Abdominal hysterectomy  2008    partial  . Right toe bunionectomy    . Right wrist ganglion cyst removal      There were no vitals filed for this visit.  Visit Diagnosis:  Cervicalgia  Spasm of muscle      Subjective Assessment - 01/18/15 1544    Subjective Patient reports she is noticing improved flexibility in neck and continues with tingling across both shoulders radiating down arms to hands. Her symptoms are constant with daily activities/sitting.    Limitations Sitting;Reading;House hold activities   Currently in Pain? Yes   Pain Score 5    Pain Orientation Other (Comment)  bilateral upper traps with radiating symptoms into both arms, left > right   Pain Descriptors / Indicators Tightness;Spasm;Tingling   Pain Type Acute pain   Pain Onset More than a month ago       Objective: Cervical spine AROM: Rotation left 55, right 65, flexion 55, extension 60, lateral flexion right 55, left 50 (overall significant improvement from initial evaluation) Palpation; significant increased spasms palpable along  left side cervical spine into upper trapezius muscle, + spasms also noted right upper trapezius muscle and along bilateral c spine paraspinal muscles         [facility] Adult PT Treatment/Exercise - 01/18/15 1547    Modalities   Modalities Electrical Stimulation;Moist Heat   Moist Heat Therapy   Moist Heat Location Cervical spine bilaterally with patient seated x 20 min   Manual Therapy   Manual Therapy Soft tissue mobilization: cervical spine and upper trapezius muscles with patient seated with back supported superficial and deep tissue mobilization as tolerated     Electrical stimulation: high volt (muscle spasm protocol) (4) electrodes applied to upper trapezius muscles and bilateral aspect thoracic spine along medial border of scapula Goal: pain, reduction of muscle spasms  Patient response to treatment: improved soft tissue elasticity with decreased spasms by 50% and decreased tenderness noted at end of session, mild tingling continues into both UE's, decreased following treatment      PT Education - 01/18/15 1600    Education provided Yes   Education Details Reassessed home exercises with patient verbally: using resistive bands for posture exercises of scapular adduction and shoulder extension to hips   Person(s) Educated Patient   Methods Explanation   Comprehension Verbalized understanding             PT Long Term Goals - 01/05/15 1700  PT LONG TERM GOAL #1   Title Patient will demonstrate improvement with function with decreased cervical spine pain and limitations as indicated by NDI score of 10% or less by 01/27/2015   Baseline NDI = 22% ( 0 = no self perceived disability)   Status New   PT LONG TERM GOAL #2   Title Patient will demonstrate improved function with use of right UE as indicated by quickDash score of 25% or better work module and <5% for daily tasks by 01/27/2015   Baseline Quick Dash work 50%, daily function 11%   Status New   PT LONG TERM GOAL #3    Title Patient will demonstrate good understanding of posture awareness and be independent with home exercise program without cuing by 01/27/2015 in order to self manage symptoms   Baseline limited knoweldge of pain control, posture awareness and progression of appropriate exercises   Status New   PT LONG TERM GOAL #4   Title Patient will have cervical spine ROM WNL's and pain free or mild pain in order to be able to perform daily activities with less difficulty by 01/27/2015   Baseline cervical spine extension 35 (painful), rotation left 35, right 45   Status New               Plan - 01/18/15 1635    Clinical Impression Statement Patient is progressing with goals with improved flexibility in cervical spine. She continues with spasms and symptoms radiating into both UE's and will require additional physical therapy to address spasms/pain in order for patient to progress exercises and return to prior level of function.   Pt will benefit from skilled therapeutic intervention in order to improve on the following deficits Pain;Increased muscle spasms;Impaired flexibility;Decreased range of motion   Rehab Potential Good   PT Frequency 2x / week   PT Duration 3 weeks   PT Treatment/Interventions Patient/family education;Ultrasound;Moist Heat;Electrical Stimulation;Therapeutic exercise;Cryotherapy;Manual techniques   PT Next Visit Plan pain control, reduce spasms cervical spine, progress ROM/exercises        Problem List Patient Active Problem List   Diagnosis Date Noted  . Cervical disc disorder with radiculopathy of cervical region 12/25/2014  . Essential hypertension 08/19/2014  . Slipped rib syndrome 03/23/2014  . Degenerative disc disease, thoracic 03/23/2014  . Nonallopathic lesion of thoracic region 03/23/2014  . Nonallopathic lesion-rib cage 03/23/2014  . Degenerative disk disease 02/12/2014  . Personal history of colonic polyps 02/11/2014  . Pain, pelvic, female 01/05/2014  .  Headache above the eye region 11/18/2013  . Other chest pain 11/18/2013  . Obesity 07/01/2013  . Tobacco abuse counseling 03/15/2013  . Routine general medical examination at a health care facility 09/04/2012  . COPD (chronic obstructive pulmonary disease) 05/26/2012  . Chronic shoulder pain 04/09/2012  . Generalized anxiety disorder 04/06/2012  . Tobacco abuse 04/06/2012  . Depression   . Heart murmur   . Hyperlipidemia   . Allergy     Jomarie Longs PT 01/19/2015, 2:41 PM  [facility] [address], [place], [address] Phone: [phone]   Fax:  [phone]

## 2015-01-21 ENCOUNTER — Encounter: Payer: Self-pay | Admitting: Physical Therapy

## 2015-01-21 ENCOUNTER — Telehealth: Payer: Self-pay | Admitting: Internal Medicine

## 2015-01-21 ENCOUNTER — Ambulatory Visit: Payer: PRIVATE HEALTH INSURANCE | Admitting: Physical Therapy

## 2015-01-21 DIAGNOSIS — M542 Cervicalgia: Secondary | ICD-10-CM

## 2015-01-21 DIAGNOSIS — M5010 Cervical disc disorder with radiculopathy, unspecified cervical region: Secondary | ICD-10-CM

## 2015-01-21 DIAGNOSIS — M62838 Other muscle spasm: Secondary | ICD-10-CM

## 2015-01-21 MED ORDER — GABAPENTIN 300 MG PO CAPS: 300.0000 mg | ORAL_CAPSULE | Freq: Three times a day (TID) | ORAL | Status: DC

## 2015-01-21 MED ORDER — PREDNISONE 10 MG PO TABS: ORAL_TABLET | ORAL | Status: DC

## 2015-01-21 NOTE — Therapy (Signed)
[facility] [address], [place], [address] Phone: [phone]   Fax:  [phone]  Physical Therapy Treatment  Patient Details  Name: Melanie Fisher MRN: [id number] Date of Birth: Oct 30, 1965 Referring Provider:  Blima Singer, FNP  Encounter Date: 01/21/2015      PT End of Session - 01/21/15 1630    Visit Number 4   Number of Visits 6   Date for PT Re-Evaluation 01/27/15   PT Start Time 0623   PT Stop Time 1630   PT Time Calculation (min) 52 min   Activity Tolerance Patient tolerated treatment well   Behavior During Therapy [facility] for tasks assessed/performed      Past Medical History  Diagnosis Date  . Sleep apnea   . Depression   . Heart murmur   . Hyperlipidemia   . Allergy   . Asthma   . Anxiety   . Tobacco abuse     Past Surgical History  Procedure Laterality Date  . Abdominal hysterectomy  2008    partial  . Right toe bunionectomy    . Right wrist ganglion cyst removal      There were no vitals filed for this visit.  Visit Diagnosis:  Cervicalgia  Spasm of muscle      Subjective Assessment - 01/21/15 1544    Subjective Patient reports she is noticing improved flexibility in neck and continues with tingling across both shoulders radiating down arms to hands. Her symptoms are constant with daily activities/sitting.    Limitations Sitting;Reading;House hold activities   Currently in Pain? Yes   Pain Score 5    Pain Location Neck   Pain Orientation Other (Comment)  upper trapezius muscles into shoulders/UE's   Pain Descriptors / Indicators Aching;Tingling;Spasm   Pain Type Acute pain   Pain Radiating Towards radiating into both UE's to hands   Pain Onset More than a month ago   Pain Frequency Intermittent      Objective: Cervical spine AROM: flexion 55, extension 55, lateral flexion right and left 45 degrees with increased pulling on left side with right lateral  flexion Grip strength with dynamometer: right  35, 30, 30#; left 28,30, 25#   Palpation: increased  Spasms palpable along right side paraspinal muscle and left upper trapezius muscle       [facility] Adult PT Treatment/Exercise - 01/21/15 0001    Modalities   Modalities Electrical Stimulation;Moist Heat   Moist Heat Therapy   Number Minutes Moist Heat 20 Minutes   Moist Heat Location Cervical   Electrical Stimulation   Electrical Stimulation Location upper trapezius muscles and along medial border both scapula with patient seated in chair   Electrical Stimulation Parameters high volt estim. with (4) electrodes applied to bilateral upper traps and along medial border of scapula, continuous mode for muscle spasms   Electrical Stimulation Goals Pain;Other (comment)  muscle spasms   Manual Therapy   Manual Therapy Soft tissue mobilization   Soft tissue mobilization with patient supine lying sub occipital release, STM along bilateral cervical spine paraspinal muscles with concentration on right side today, upper trapezius stretch x 2 reps x 10 second holds     Patient response to treatment: improved soft tissue elasticity with decreased spasms with treatment. She also reported mild decrease in tingling into UE's            PT Education - 01/21/15 1630    Education provided Yes   Education Details re  assessed posture awareness and exercises to continue at home and positioning to decrease strain on neck with sleeping and sitting   Person(s) Educated Patient   Methods Explanation   Comprehension Verbalized understanding             PT Long Term Goals - 01/05/15 1700    PT LONG TERM GOAL #1   Title Patient will demonstrate improvement with function with decreased cervical spine pain and limitations as indicated by NDI score of 10% or less by 01/27/2015   Baseline NDI = 22% ( 0 = no self perceived disability)   Status New   PT LONG TERM GOAL #2   Title Patient will demonstrate  improved function with use of right UE as indicated by quickDash score of 25% or better work module and <5% for daily tasks by 01/27/2015   Baseline Quick Dash work 50%, daily function 11%   Status New   PT LONG TERM GOAL #3   Title Patient will demonstrate good understanding of posture awareness and be independent with home exercise program without cuing by 01/27/2015 in order to self manage symptoms   Baseline limited knoweldge of pain control, posture awareness and progression of appropriate exercises   Status New   PT LONG TERM GOAL #4   Title Patient will have cervical spine ROM WNL's and pain free or mild pain in order to be able to perform daily activities with less difficulty by 01/27/2015   Baseline cervical spine extension 35 (painful), rotation left 35, right 45   Status New               Plan - 01/21/15 1630    Clinical Impression Statement Patient demonstrates decreasing spasms and improving ROM in cervical spine. She continues with symptoms of tingling into both UE's with decreased grip strength bilaterally. She will benefit from additioanl physical therapy to address spasms/pain.    Pt will benefit from skilled therapeutic intervention in order to improve on the following deficits Pain;Increased muscle spasms;Impaired flexibility;Decreased range of motion   Rehab Potential Good   PT Frequency 2x / week   PT Duration 3 weeks   PT Treatment/Interventions Patient/family education;Ultrasound;Moist Heat;Electrical Stimulation;Therapeutic exercise;Cryotherapy;Manual techniques   PT Next Visit Plan pain control, reduce spasms cervical spine, progress ROM/exercises        Problem List Patient Active Problem List   Diagnosis Date Noted  . Cervical disc disorder with radiculopathy of cervical region 12/25/2014  . Essential hypertension 08/19/2014  . Slipped rib syndrome 03/23/2014  . Degenerative disc disease, thoracic 03/23/2014  . Nonallopathic lesion of thoracic region  03/23/2014  . Nonallopathic lesion-rib cage 03/23/2014  . Degenerative disk disease 02/12/2014  . Personal history of colonic polyps 02/11/2014  . Pain, pelvic, female 01/05/2014  . Headache above the eye region 11/18/2013  . Other chest pain 11/18/2013  . Obesity 07/01/2013  . Tobacco abuse counseling 03/15/2013  . Routine general medical examination at a health care facility 09/04/2012  . COPD (chronic obstructive pulmonary disease) 05/26/2012  . Chronic shoulder pain 04/09/2012  . Generalized anxiety disorder 04/06/2012  . Tobacco abuse 04/06/2012  . Depression   . Heart murmur   . Hyperlipidemia   . Allergy     Jomarie Longs PT 01/21/2015, 8:54 PM  [facility] [address] [place], [address] Phone: [phone]   Fax:  [phone]

## 2015-01-21 NOTE — Telephone Encounter (Signed)
MRI ordered.  See first message.  i noticed that she is probably out of the gabapentin and I can refill the gabapentin at same dose or increase it to 300 mg up to 3 times daily if she can tolerate it. For the tingling,  As well as repeat the prednisone taper. Let me know if she would like these meds sent.  I will print them for faxing to save time since she uses the [facility] pharmacy
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
## 2015-01-21 NOTE — Telephone Encounter (Signed)
Tried to reach patient by phone cell has been disconnected and home phone number is wrong faxed medication to pharmacy.

## 2015-01-21 NOTE — Telephone Encounter (Signed)
This is now a chronic problem and there is no reason she has to be seen in 4 hours   .  She has seen me,  Sports medicine and PT. If her radiculopathy is worse she needs to have an MRI of the cervical spine, which I will order , and if there is a surgical issue I will make a referral to Neurosurgery; if there is not a surgical issue, I will make a referral to the Pain clinic for other modalities of treatment.

## 2015-01-22 NOTE — Telephone Encounter (Signed)
Spoke with pt, advised Rx has been faxed and MRI ordered

## 2015-01-25 ENCOUNTER — Ambulatory Visit: Payer: PRIVATE HEALTH INSURANCE | Admitting: Physical Therapy

## 2015-01-25 ENCOUNTER — Encounter: Payer: Self-pay | Admitting: Physical Therapy

## 2015-01-25 DIAGNOSIS — M542 Cervicalgia: Secondary | ICD-10-CM

## 2015-01-25 DIAGNOSIS — M62838 Other muscle spasm: Secondary | ICD-10-CM

## 2015-01-25 NOTE — Therapy (Signed)
[facility] [address] [place], [address] Phone: [phone]   Fax:  [phone]  Physical Therapy Treatment  Patient Details  Name: Melanie Fisher MRN: [id number] Date of Birth: [birth year] Referring Provider:  Blima Singer, FNP  Encounter Date: 01/25/2015      PT End of Session - 01/25/15 1653    Visit Number 5   Number of Visits 6   Date for PT Re-Evaluation 01/27/15   PT Start Time 1540   PT Stop Time 1640   PT Time Calculation (min) 60 min   Activity Tolerance Patient tolerated treatment well   Behavior During Therapy [facility] for tasks assessed/performed      Past Medical History  Diagnosis Date  . Sleep apnea   . Depression   . Heart murmur   . Hyperlipidemia   . Allergy   . Asthma   . Anxiety   . Tobacco abuse     Past Surgical History  Procedure Laterality Date  . Abdominal hysterectomy  2008    partial  . Right toe bunionectomy    . Right wrist ganglion cyst removal      There were no vitals filed for this visit.  Visit Diagnosis:  Cervicalgia  Spasm of muscle      Subjective Assessment - 01/25/15 1545    Subjective Patient reports she is noticing improved flexibility in neck and continues with tingling across both shoulders radiating down arms to hands. Her symptoms are constant with daily activities/sitting. Patient is having an MRI this week for neck pain. She is having more tightness with tingling on left side than right.   Limitations Sitting;Reading;House hold activities   Currently in Pain? Yes   Pain Score 5    Pain Location Neck   Pain Orientation Other (Comment)  bilateral upper trapezius muscles into both shoulders with tingling into both UE's, still worse in left UE   Pain Descriptors / Indicators Aching;Tingling;Spasm   Pain Type Acute pain   Pain Radiating Towards Radiating into both UE's, left worse than right to both hands   Pain Onset More than a  month ago      Objective:  Palpation: bilateral upper trapezius muscles with increased spasms and decreased mobility in cervical spine and across both shoulders/trapezius muscles       [facility] Adult PT Treatment/Exercise - 01/25/15 1551    Exercises   Exercises Other Exercises   Other Exercises  scapular rows with OMEGA 5#, 10# lat pull downs x 10 - 15 reps each and reverse chin ups with patient seated in chair x 10 reps with 15#   Modalities   Modalities Electrical Stimulation;Moist Heat   Moist Heat Therapy   Moist Heat Location Cervical spine across both shoulders/upper trapezius muscles   Electrical Stimulation   Electrical Stimulation Location High volt estim. With (4) electrodes upper trapezius muscles and along medial border both scapula with patient seated in chair   Electrical Stimulation Goals Pain;Other (comment)  muscle spasms   Manual Therapy   Manual Therapy Soft tissue mobilization   Soft tissue mobilization with patient supine lying sub occipital release, STM along bilateral cervical spine paraspinal muscles with concentration on left side today, upper trapezius stretch x 2 reps x 10 second holds both upper traps.      Patient response to treatment: decreased spasms with improved soft tissue elasticity and mobility cervical spine left side; continues with same intensity of tingling symptoms in both  UE's, Grip strength 35#, 30# left, 30#, 30# right          PT Education - 01/25/15 1652    Education provided Yes   Education Details Re assessed home program with scapular adduction, lat pull down and posture awareness to continue at home (performed with OMEGA cable exercises with 10# and 15#)   Person(s) Educated Patient   Methods Explanation;Verbal cues;Demonstration   Comprehension Verbalized understanding;Returned demonstration;Verbal cues required             PT Long Term Goals - 01/05/15 1700    PT LONG TERM GOAL #1   Title Patient will demonstrate  improvement with function with decreased cervical spine pain and limitations as indicated by NDI score of 10% or less by 01/27/2015   Baseline NDI = 22% ( 0 = no self perceived disability)   Status New   PT LONG TERM GOAL #2   Title Patient will demonstrate improved function with use of right UE as indicated by quickDash score of 25% or better work module and <5% for daily tasks by 01/27/2015   Baseline Quick Dash work 50%, daily function 11%   Status New   PT LONG TERM GOAL #3   Title Patient will demonstrate good understanding of posture awareness and be independent with home exercise program without cuing by 01/27/2015 in order to self manage symptoms   Baseline limited knoweldge of pain control, posture awareness and progression of appropriate exercises   Status New   PT LONG TERM GOAL #4   Title Patient will have cervical spine ROM WNL's and pain free or mild pain in order to be able to perform daily activities with less difficulty by 01/27/2015   Baseline cervical spine extension 35 (painful), rotation left 35, right 45   Status New               Plan - 01/25/15 1654    Clinical Impression Statement Patient demonstrates decreasing spasms and improving ROM to WFL's all planes in cervical spine. She continues with symptoms of tingling into both UE's with left side worse than right and this correlates with increased spasms on left side upper trapezius muscles. She has equal grip strength both hands of 30#. She will benefit from additional physical physical therapy intervention to address spasms and improve ROM/function with daily tasks.    Pt will benefit from skilled therapeutic intervention in order to improve on the following deficits Pain;Increased muscle spasms;Impaired flexibility;Decreased range of motion   Rehab Potential Good   PT Frequency 2x / week   PT Duration 3 weeks   PT Treatment/Interventions Patient/family education;Ultrasound;Moist Heat;Electrical  Stimulation;Therapeutic exercise;Cryotherapy;Manual techniques   PT Next Visit Plan pain control, reduce spasms cervical spine, progress ROM/exercises        Problem List Patient Active Problem List   Diagnosis Date Noted  . Cervical disc disorder with radiculopathy of cervical region 12/25/2014  . Essential hypertension 08/19/2014  . Slipped rib syndrome 03/23/2014  . Degenerative disc disease, thoracic 03/23/2014  . Nonallopathic lesion of thoracic region 03/23/2014  . Nonallopathic lesion-rib cage 03/23/2014  . Degenerative disk disease 02/12/2014  . Personal history of colonic polyps 02/11/2014  . Pain, pelvic, female 01/05/2014  . Headache above the eye region 11/18/2013  . Other chest pain 11/18/2013  . Obesity 07/01/2013  . Tobacco abuse counseling 03/15/2013  . Routine general medical examination at a health care facility 09/04/2012  . COPD (chronic obstructive pulmonary disease) 05/26/2012  . Chronic shoulder pain 04/09/2012  .  Generalized anxiety disorder 04/06/2012  . Tobacco abuse 04/06/2012  . Depression   . Heart murmur   . Hyperlipidemia   . Allergy     Jomarie Longs PT 01/25/2015, 5:01 PM  [facility] [address], [place], [address] Phone: [phone]   Fax:  [phone]

## 2015-01-27 ENCOUNTER — Encounter: Payer: Self-pay | Admitting: Physical Therapy

## 2015-01-27 ENCOUNTER — Ambulatory Visit: Payer: PRIVATE HEALTH INSURANCE | Admitting: Physical Therapy

## 2015-01-27 DIAGNOSIS — M62838 Other muscle spasm: Secondary | ICD-10-CM

## 2015-01-27 DIAGNOSIS — M542 Cervicalgia: Secondary | ICD-10-CM | POA: Diagnosis not present

## 2015-01-28 ENCOUNTER — Ambulatory Visit: Payer: PRIVATE HEALTH INSURANCE | Admitting: Physical Therapy

## 2015-01-28 ENCOUNTER — Telehealth: Payer: Self-pay

## 2015-01-28 NOTE — Therapy (Signed)
[facility] [address] [place], [address] Phone: [phone]   Fax:  [phone]  Physical Therapy Treatment  Patient Details  Name: Melanie Fisher MRN: [id number] Date of Birth: January 03, 1966 Referring Provider:  Blima Singer, FNP  Encounter Date: 01/27/2015      PT End of Session - 01/27/15 1635    Visit Number 6   Number of Visits 6   Date for PT Re-Evaluation 01/27/15   PT Start Time 9485   PT Stop Time 1630   PT Time Calculation (min) 47 min   Activity Tolerance Patient tolerated treatment well   Behavior During Therapy [facility] for tasks assessed/performed      Past Medical History  Diagnosis Date  . Sleep apnea   . Depression   . Heart murmur   . Hyperlipidemia   . Allergy   . Asthma   . Anxiety   . Tobacco abuse     Past Surgical History  Procedure Laterality Date  . Abdominal hysterectomy  2008    partial  . Right toe bunionectomy    . Right wrist ganglion cyst removal      There were no vitals filed for this visit.  Visit Diagnosis:  Cervicalgia  Spasm of muscle      Subjective Assessment - 01/27/15 1543    Subjective Patient reports she is noticing improved flexibility in neck and continues with tingling across both shoulders radiating down arms to hands. Her symptoms are constant with daily activities/sitting. Patient is having an MRI this Friday 01/29/15.    Limitations Sitting;Reading;House hold activities   Currently in Pain? Yes   Pain Score 4    Pain Location Neck   Pain Orientation Other (Comment)  both upper trapezius muscles and into both UE's to hands   Pain Descriptors / Indicators Aching;Tingling   Pain Type Acute pain   Pain Radiating Towards Pain radiates to both UE"s, left worse than right and also has tingling in left LE    Pain Onset More than a month ago   Pain Frequency Intermittent      Objective: Re assessed NDI/quickdash and AROM  cervical spine (see goals for results) Palpation: increased spasms and tenderness along bilateral cervical paraspinals and both upper trapezius muscles       [facility] Adult PT Treatment/Exercise - 01/27/15 1548    Exercises   Exercises Other Exercises   Other Exercises  scapular rows with OMEGA 5#, 10# lat pull downs, reverse chin ups with verbal cuing and assistance for correct technique (attempted 15# for lat pull down with increased tingling into UE's therefore discontinued)   Modalities   Modalities Electrical Stimulation;Moist Heat   Moist Heat Therapy   Moist Heat Location Cervical   Electrical Stimulation   Electrical Stimulation Location upper trapezius muscles and along medial border both scapula with patient seated in chair for high volt estim. For muscle spasms (continuous mode)   Electrical Stimulation Goals Pain;Other (comment)  muscle spasms   Manual Therapy   Manual Therapy Soft tissue mobilization   Soft tissue mobilization with patient supine lying sub occipital release, STM along bilateral cervical spine paraspinal muscles with concentration on left side today, upper trapezius stretch x 2 reps x 10 second holds       Patient response to treatment: improved soft tissue elasticity, decreased spasms and tenderness by 50%, improved cervical spine ROM with decreased stiffness, good understanding of exercises and technique with verbal cuing and  guidance         PT Education - 01/27/15 1630    Education provided Yes   Education Details instructed in continuing exercise as instructed, good posture awareness    Person(s) Educated Patient   Methods Explanation;Demonstration   Comprehension Verbalized understanding;Returned demonstration;Verbal cues required             PT Long Term Goals - 01/27/15 1215    PT LONG TERM GOAL #1   Title Patient will demonstrate improvement with function with decreased cervical spine pain and limitations as indicated by NDI score of  10% or less by 01/27/2015   Baseline NDI = 22% ( 0 = no self perceived disability)  (current NDI 01/27/15: 22%; no change since initial evaluation)   Status Not Met   PT LONG TERM GOAL #2   Title Patient will demonstrate improved function with use of right UE as indicated by quickDash score of 25% or better work module and <5% for daily tasks by 01/27/2015   Baseline Quick Dash work 50%, daily function 11%: current Quick dash 01/27/15: 41%; work module: 37.5%  (improving since evaluation and continues to require intervention)   Status Partially Met   PT LONG TERM GOAL #3   Title Patient will demonstrate good understanding of posture awareness and be independent with home exercise program without cuing by 01/27/2015 in order to self manage symptoms   Baseline limited knoweldge of pain control, posture awareness and progression of appropriate exercises   Status Partially Met   PT LONG TERM GOAL #4   Title Patient will have cervical spine ROM WNL's and pain free or mild pain in order to be able to perform daily activities with less difficulty by 01/27/2015   Baseline cervical spine extension 35 (painful), rotation left 35, right 45 (current AROM cervical spine rotation 55 right and left, lfexion 65, extension 45 degrees, lateral flexion left 40, right 45 degrees with increaed left sided pain with LF left)   Status Partially Met               Plan - 01/27/15 1635    Clinical Impression Statement Patient has demonstrated improvement with increased flexibiltiy in cervical spine and continues with paresthesias without change in distribution and mild changes in intensity into both UE's (left side >right). She has spasms along both cervical spine paraspinal muscles and upper trapezius muscles which have responded favorably to physical therapy interventions. She is progressing with exercises for strength and control for upper body, cervical spine and core and will continue to benefit from additional  physical thearpy intervention if agreed by MD, following MRI results. Goals have been partially met or not met as outlined previously    Pt will benefit from skilled therapeutic intervention in order to improve on the following deficits Pain;Increased muscle spasms;Impaired flexibility;Decreased range of motion   Rehab Potential Good   PT Frequency 2x / week   PT Duration 3 weeks   PT Treatment/Interventions Patient/family education;Ultrasound;Moist Heat;Electrical Stimulation;Therapeutic exercise;Cryotherapy;Manual techniques   PT Home Exercise Plan posture, ROM and strengthening with resistive bands/handout given        Problem List Patient Active Problem List   Diagnosis Date Noted  . Cervical disc disorder with radiculopathy of cervical region 12/25/2014  . Essential hypertension 08/19/2014  . Slipped rib syndrome 03/23/2014  . Degenerative disc disease, thoracic 03/23/2014  . Nonallopathic lesion of thoracic region 03/23/2014  . Nonallopathic lesion-rib cage 03/23/2014  . Degenerative disk disease 02/12/2014  . Personal history  of colonic polyps 02/11/2014  . Pain, pelvic, female 01/05/2014  . Headache above the eye region 11/18/2013  . Other chest pain 11/18/2013  . Obesity 07/01/2013  . Tobacco abuse counseling 03/15/2013  . Routine general medical examination at a health care facility 09/04/2012  . COPD (chronic obstructive pulmonary disease) 05/26/2012  . Chronic shoulder pain 04/09/2012  . Generalized anxiety disorder 04/06/2012  . Tobacco abuse 04/06/2012  . Depression   . Heart murmur   . Hyperlipidemia   . Allergy     Aldona Lento 01/28/2015, 3:24 PM  [facility] [address] [place], [address] Phone: [phone]   Fax:  [phone]

## 2015-01-28 NOTE — Telephone Encounter (Signed)
Pt called stating she is having problems concentrating due to the tingling she is experiencing in her neck, arms, and legs. She wanted to inform you of these issues so you were aware and wanted to know how you'd like to proceed.

## 2015-01-29 ENCOUNTER — Ambulatory Visit
Admission: RE | Admit: 2015-01-29 | Discharge: 2015-01-29 | Disposition: A | Discharge status: Home / Self Care | Payer: 59 | Source: Ambulatory Visit | Attending: Internal Medicine | Admitting: Internal Medicine

## 2015-01-29 DIAGNOSIS — M47892 Other spondylosis, cervical region: Secondary | ICD-10-CM | POA: Insufficient documentation

## 2015-01-29 DIAGNOSIS — M79641 Pain in right hand: Secondary | ICD-10-CM | POA: Diagnosis present

## 2015-01-29 DIAGNOSIS — M4802 Spinal stenosis, cervical region: Secondary | ICD-10-CM | POA: Insufficient documentation

## 2015-01-29 DIAGNOSIS — M5010 Cervical disc disorder with radiculopathy, unspecified cervical region: Secondary | ICD-10-CM

## 2015-01-29 DIAGNOSIS — M5022 Other cervical disc displacement, mid-cervical region: Secondary | ICD-10-CM | POA: Diagnosis not present

## 2015-01-29 DIAGNOSIS — M542 Cervicalgia: Secondary | ICD-10-CM | POA: Diagnosis present

## 2015-01-29 NOTE — Telephone Encounter (Signed)
Left message for patient to return call to office. 

## 2015-01-29 NOTE — Telephone Encounter (Signed)
There is no change to the plan,  I cannot prescribe anything for "trouble concentrating."  She is already taking prazolam, citalopram and wellbutrin,  If she would like to see a  psychiatrist I will be happy to make a referral , but the process takes several weeks so she will just have to try harder to concentrate

## 2015-01-31 ENCOUNTER — Encounter: Payer: Self-pay | Admitting: Internal Medicine

## 2015-02-02 ENCOUNTER — Telehealth: Payer: Self-pay | Admitting: Internal Medicine

## 2015-02-02 NOTE — Telephone Encounter (Signed)
Left message for patient to return call to office. 

## 2015-02-02 NOTE — Telephone Encounter (Signed)
Patient will have labs drawn at the hospital.

## 2015-02-09 ENCOUNTER — Ambulatory Visit: Payer: 59 | Admitting: Internal Medicine

## 2015-02-10 ENCOUNTER — Other Ambulatory Visit (INDEPENDENT_AMBULATORY_CARE_PROVIDER_SITE_OTHER): Payer: 59

## 2015-02-10 ENCOUNTER — Ambulatory Visit (INDEPENDENT_AMBULATORY_CARE_PROVIDER_SITE_OTHER): Payer: 59 | Admitting: Family Medicine

## 2015-02-10 ENCOUNTER — Encounter: Payer: Self-pay | Admitting: Family Medicine

## 2015-02-10 VITALS — BP 130/84 | HR 67 | Ht 63.0 in | Wt 191.0 lb

## 2015-02-10 DIAGNOSIS — F411 Generalized anxiety disorder: Secondary | ICD-10-CM

## 2015-02-10 DIAGNOSIS — G629 Polyneuropathy, unspecified: Secondary | ICD-10-CM | POA: Insufficient documentation

## 2015-02-10 DIAGNOSIS — M2550 Pain in unspecified joint: Secondary | ICD-10-CM | POA: Diagnosis not present

## 2015-02-10 DIAGNOSIS — M5010 Cervical disc disorder with radiculopathy, unspecified cervical region: Secondary | ICD-10-CM

## 2015-02-10 LAB — TSH: TSH: 1.63 u[IU]/mL (ref 0.35–4.50)

## 2015-02-10 LAB — VITAMIN D 25 HYDROXY (VIT D DEFICIENCY, FRACTURES): VITD: 24.31 ng/mL — ABNORMAL LOW (ref 30.00–100.00)

## 2015-02-10 LAB — VITAMIN B12: Vitamin B-12: 256 pg/mL (ref 211–911)

## 2015-02-10 LAB — IRON: Iron: 108 ug/dL (ref 42–145)

## 2015-02-10 LAB — T3, FREE: T3, Free: 3.2 pg/mL (ref 2.3–4.2)

## 2015-02-10 LAB — T4, FREE: Free T4: 0.71 ng/dL (ref 0.60–1.60)

## 2015-02-10 MED ORDER — HYDROXYZINE HCL 25 MG PO TABS: 25.0000 mg | ORAL_TABLET | Freq: Three times a day (TID) | ORAL | Status: DC | PRN

## 2015-02-10 NOTE — Patient Instructions (Signed)
Stop the atrovostatin for 1 week and see if helps.  Vitamin D 2000 iU daily We will get labs Cotinue with PT when you return Hydroxyzine up to 3 times daily as needed See me again in 4 weeks.
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
## 2015-02-10 NOTE — Assessment & Plan Note (Signed)
No signs of cervical nerve root and compression that could be causing. We'll check metabolic causes. EMG may be warranted later. Patient will come back and see me again in 4 weeks.

## 2015-02-10 NOTE — Progress Notes (Signed)
Pre visit review using our clinic review tool, if applicable. No additional management support is needed unless otherwise documented below in the visit note. 

## 2015-02-10 NOTE — Assessment & Plan Note (Signed)
Patient does have known osteophytic changes but patient's MRI shows no nerve root impingement. I do not know if this is neurologic in nature at this time. I did discuss with her the possibility of further testing including an EMG which we will hold on for now. We will get labs to further evaluate. See if there is anything metabolic that could be contributing. No signs of infectious etiology at this point. Encourage her to do the formal physical therapy which I think be beneficial. Patient and will come back and see me again in 4 weeks to make sure she is responding to the home exercises, icing protocol, and the different changes. Underlying psychosomatic dysfunction but also be contributing.

## 2015-02-10 NOTE — Assessment & Plan Note (Signed)
Given hydroxyzine for any breakthrough anxiety discuss potential side effects.

## 2015-02-11 LAB — PTH, INTACT AND CALCIUM
Calcium: 9.4 mg/dL (ref 8.4–10.5)
PTH: 36 pg/mL (ref 14–64)

## 2015-02-17 ENCOUNTER — Encounter: Payer: PRIVATE HEALTH INSURANCE | Admitting: Physical Therapy

## 2015-02-23 ENCOUNTER — Encounter: Payer: Self-pay | Admitting: Physical Therapy

## 2015-02-23 ENCOUNTER — Ambulatory Visit: Payer: PRIVATE HEALTH INSURANCE | Attending: Family | Admitting: Physical Therapy

## 2015-02-23 DIAGNOSIS — M62838 Other muscle spasm: Secondary | ICD-10-CM | POA: Diagnosis present

## 2015-02-23 DIAGNOSIS — M542 Cervicalgia: Secondary | ICD-10-CM | POA: Insufficient documentation
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
## 2015-02-25 ENCOUNTER — Ambulatory Visit (INDEPENDENT_AMBULATORY_CARE_PROVIDER_SITE_OTHER): Payer: 59 | Admitting: Internal Medicine

## 2015-02-25 ENCOUNTER — Ambulatory Visit: Payer: PRIVATE HEALTH INSURANCE | Admitting: Physical Therapy

## 2015-02-25 ENCOUNTER — Encounter: Payer: Self-pay | Admitting: Internal Medicine

## 2015-02-25 VITALS — BP 126/78 | HR 67 | Temp 98.2°F | Resp 14 | Ht 63.0 in | Wt 193.5 lb

## 2015-02-25 DIAGNOSIS — M5010 Cervical disc disorder with radiculopathy, unspecified cervical region: Secondary | ICD-10-CM

## 2015-02-25 MED ORDER — TRAMADOL HCL 50 MG PO TABS: 50.0000 mg | ORAL_TABLET | Freq: Four times a day (QID) | ORAL | Status: DC | PRN
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
## 2015-02-25 NOTE — Patient Instructions (Signed)
I am increasing your tramadol to 3 times daily and reduce the anxiety pill if able,  To reduce your daytime sedation    Reduce the gabapentin to once or twice daily (definitely at bedtime)    We will recheck your B12 level in a month

## 2015-02-25 NOTE — Progress Notes (Signed)
Pre-visit discussion using our clinic review tool. No additional management support is needed unless otherwise documented below in the visit note.  

## 2015-02-27 NOTE — Assessment & Plan Note (Signed)
She continues report pain radiculopathic symptoms to the left arm and leg, which is unexpected given the mild changes seen on cervical MRI.  Will increase the tramadol to 2-3 times daily and reduce the alprazolam given her increased risk of dependence. I have encouraged her to continue working.  If no improvement with PT, will need EMG/nervie conduction studies.

## 2015-03-01 ENCOUNTER — Telehealth: Payer: Self-pay | Admitting: Internal Medicine

## 2015-03-01 ENCOUNTER — Encounter: Payer: PRIVATE HEALTH INSURANCE | Admitting: Physical Therapy

## 2015-03-01 ENCOUNTER — Encounter: Payer: Self-pay | Admitting: *Deleted

## 2015-03-01 NOTE — Telephone Encounter (Signed)
Letter faxed to [facility] PT

## 2015-03-01 NOTE — Telephone Encounter (Signed)
Pt called and said that she needs a letter stating that she needs to continue PT .  You might want call her to see exactly why she needs this?    Fax to pt at [phone]

## 2015-03-08 ENCOUNTER — Encounter: Payer: 59 | Admitting: Physical Therapy

## 2015-03-10 ENCOUNTER — Ambulatory Visit (INDEPENDENT_AMBULATORY_CARE_PROVIDER_SITE_OTHER): Payer: 59 | Admitting: Family Medicine

## 2015-03-10 ENCOUNTER — Encounter: Payer: PRIVATE HEALTH INSURANCE | Admitting: Physical Therapy

## 2015-03-10 ENCOUNTER — Encounter: Payer: Self-pay | Admitting: Family Medicine

## 2015-03-10 ENCOUNTER — Other Ambulatory Visit (INDEPENDENT_AMBULATORY_CARE_PROVIDER_SITE_OTHER): Payer: 59

## 2015-03-10 VITALS — BP 114/84 | HR 70 | Ht 63.0 in | Wt 192.0 lb

## 2015-03-10 DIAGNOSIS — M5010 Cervical disc disorder with radiculopathy, unspecified cervical region: Secondary | ICD-10-CM | POA: Diagnosis not present

## 2015-03-10 DIAGNOSIS — M7552 Bursitis of left shoulder: Secondary | ICD-10-CM | POA: Diagnosis not present

## 2015-03-10 DIAGNOSIS — M25512 Pain in left shoulder: Secondary | ICD-10-CM

## 2015-03-10 NOTE — Assessment & Plan Note (Signed)
Patient was given an injection home exercises and icing protocol.  Patient tried topical anti-inflammatory's. Also patient will do well with conservative therapy.

## 2015-03-10 NOTE — Assessment & Plan Note (Signed)
I do believe that the mild spinal stenosis could continue to be contributing. Likely an exacerbation from the injury of an underlying problem. Patient does have tramadol and did increase her dosing with primary care. Patient will continue with the vitamin D. We discussed the possibility of making other medication changes to treat for may be a complex regional pain syndrome. This wouldn't have patient stop her Celexa and treatment and possibly start her on Effexor. We will discuss with primary care provider before any changes.
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
## 2015-03-10 NOTE — Patient Instructions (Signed)
Good to see you Tried an injection in shoulder to help with the pain and consider if it is from neck or not. We will get EMG of the left arm to make sure it is from the neck.  Continue the exercises and alternate with new ones for the shoulder Hopefully Physical therapy will be calling you.  I will discuss with tullo about changing a couple meds' See me again in 4 weeks.

## 2015-03-12 ENCOUNTER — Telehealth: Payer: Self-pay

## 2015-03-12 NOTE — Telephone Encounter (Signed)
Spoke w/ pt. She reports that she thought she was taking an acid pill this afternoon and accidentally took an extra isosorbide.  She has been monitoring her BP, systolic was 471 on last check.  Advised her to continue to monitor her BP and try to keep her meds separate so as not to mix them up in the future.

## 2015-03-12 NOTE — Telephone Encounter (Signed)
Pt called, states she accidentally took 2 Isosorbide 30 mg. She took one at 9, and another one at 1:30 by mistake. Please call .

## 2015-03-15 ENCOUNTER — Ambulatory Visit: Payer: 59 | Admitting: Physical Therapy

## 2015-03-16 ENCOUNTER — Other Ambulatory Visit: Payer: Self-pay | Admitting: *Deleted

## 2015-03-16 DIAGNOSIS — M25512 Pain in left shoulder: Secondary | ICD-10-CM

## 2015-03-17 ENCOUNTER — Encounter: Payer: Self-pay | Admitting: Physical Therapy

## 2015-03-17 ENCOUNTER — Encounter: Payer: Self-pay | Admitting: Internal Medicine

## 2015-03-17 ENCOUNTER — Encounter: Payer: 59 | Admitting: Physical Therapy

## 2015-03-22 ENCOUNTER — Ambulatory Visit: Payer: 59 | Admitting: Physical Therapy

## 2015-03-22 ENCOUNTER — Other Ambulatory Visit: Payer: Self-pay | Admitting: Internal Medicine

## 2015-03-22 ENCOUNTER — Encounter: Payer: 59 | Admitting: Physical Therapy

## 2015-03-22 NOTE — Telephone Encounter (Signed)
Received a refill request for Alprazolam. Last refilled 10/09/14 for #30 with 2 refills. Last office visit 11/03/14. Ok to refill?

## 2015-03-23 ENCOUNTER — Ambulatory Visit (INDEPENDENT_AMBULATORY_CARE_PROVIDER_SITE_OTHER): Payer: 59 | Admitting: Neurology

## 2015-03-23 DIAGNOSIS — M25512 Pain in left shoulder: Secondary | ICD-10-CM

## 2015-03-23 NOTE — Procedures (Signed)
[facility]  [address], [address] [place]  [place], [place] [address] Tel: [phone] Fax:  [phone] Test Date:  03/23/2015  Patient: Melanie Fisher DOB: Nov 15, 1965 Physician: Narda Amber, DO  Sex: Female Height: 5\' 3"  Ref Phys: Charlann Boxer, M.D.  ID#: [id number] Temp: 32.0C Technician: Jerilynn Mages. Dean   Patient Complaints: This is a 49 year-old female presenting for evaluation of left sided neck and shoulder pain, radiating into her arm.   NCV & EMG Findings: Extensive electrodiagnostic testing of the left upper extremity shows:  1. Left median, ulnar, radial, and palmar sensory responses are within normal limits. 2. Left median and ulnar motor responses are within normal limits. 3. There is no evidence of active or chronic motor axon loss changes affecting any of the tested muscles.  Impression: This is a normal study of the left upper extremity.   In particular, there is no evidence of a cervical radiculopathy or carpal tunnel syndrome.   ___________________________ Narda Amber, DO    Nerve Conduction Studies Anti Sensory Summary Table   Site NR Peak (ms) Norm Peak (ms) P-T Amp (V) Norm P-T Amp  Left Median Anti Sensory (2nd Digit)  Wrist    3.4 <3.6 35.8 >15  Left Radial Anti Sensory (Base 1st Digit)  Wrist    2.1 <2.7 46.0 >14  Left Ulnar Anti Sensory (5th Digit)  Wrist    3.1 <3.1 19.9 >10   Motor Summary Table   Site NR Onset (ms) Norm Onset (ms) O-P Amp (mV) Norm O-P Amp Site1 Site2 Delta-0 (ms) Dist (cm) Vel (m/s) Norm Vel (m/s)  Left Median Motor (Abd Poll Brev)  Wrist    3.3 <4.0 9.7 >6 Elbow Wrist 3.8 21.0 55 >50  Elbow    7.1  9.5         Left Ulnar Motor (Abd Dig Minimi)  Wrist    2.8 <3.1 10.6 >7 B Elbow Wrist 2.7 15.0 56 >50  B Elbow    5.5  10.0  A Elbow B Elbow 1.8 10.0 56 >50  A Elbow    7.3  10.0          Comparison Summary Table   Site NR Peak (ms) Norm Peak (ms) P-T Amp (V) Site1 Site2 Delta-P (ms) Norm Delta (ms)  Left  Median/Ulnar Palm Comparison (Wrist - 8cm)  Median Palm    1.6 <2.2 49.6 Median Palm Ulnar Palm 0.2   Ulnar Palm    1.8 <2.2 19.1       EMG   Side Muscle Ins Act Fibs Psw Fasc Number Recrt Dur Dur. Amp Amp. Poly Poly. Comment  Left 1stDorInt Nml Nml Nml Nml Nml Nml Nml Nml Nml Nml Nml Nml N/A  Left Ext Indicis Nml Nml Nml Nml Nml Nml Nml Nml Nml Nml Nml Nml N/A  Left PronatorTeres Nml Nml Nml Nml Nml Nml Nml Nml Nml Nml Nml Nml N/A  Left Biceps Nml Nml Nml Nml Nml Nml Nml Nml Nml Nml Nml Nml N/A  Left Triceps Nml Nml Nml Nml Nml Nml Nml Nml Nml Nml Nml Nml N/A  Left Deltoid Nml Nml Nml Nml Nml Nml Nml Nml Nml Nml Nml Nml N/A  Left Supraspinatus Nml Nml Nml Nml Nml Nml Nml Nml Nml Nml Nml Nml N/A  Left Infraspinatus Nml Nml Nml Nml Nml Nml Nml Nml Nml Nml Nml Nml N/A      Waveforms:

## 2015-03-23 NOTE — Telephone Encounter (Signed)
Refill denied.  If she is still requiring prn alprazolam in addition to her alprazolam XR she needs to see psychiatry

## 2015-03-24 ENCOUNTER — Ambulatory Visit: Payer: PRIVATE HEALTH INSURANCE | Admitting: Physical Therapy

## 2015-03-25 ENCOUNTER — Encounter: Payer: 59 | Admitting: Physical Therapy

## 2015-03-29 ENCOUNTER — Other Ambulatory Visit: Payer: Self-pay | Admitting: Internal Medicine

## 2015-03-29 ENCOUNTER — Encounter: Payer: 59 | Admitting: Physical Therapy

## 2015-03-29 NOTE — Telephone Encounter (Signed)
Please advise refill? 

## 2015-03-30 ENCOUNTER — Encounter: Payer: Self-pay | Admitting: Physician Assistant

## 2015-03-30 ENCOUNTER — Ambulatory Visit: Payer: Self-pay | Admitting: Family

## 2015-03-30 VITALS — BP 120/80 | Temp 98.5°F | Wt 187.0 lb

## 2015-03-30 DIAGNOSIS — H65191 Other acute nonsuppurative otitis media, right ear: Secondary | ICD-10-CM

## 2015-03-30 MED ORDER — AZITHROMYCIN 250 MG PO TABS
ORAL_TABLET | ORAL | Status: AC
Start: 2015-03-30 — End: 2015-04-09

## 2015-03-30 NOTE — Telephone Encounter (Signed)
Refill still denied,  See prior message

## 2015-03-30 NOTE — Progress Notes (Signed)
S/ left ear pain, preceding uri x 5 days, fatigue , not blowing or c/o increased PND or facial pain Denies CP or SOB   O/ VSS mildly ill appearing  ENT : tms retracted , R is erythematous superiorly nasal turbinates boggy erythematous , No facial tenderness pharynx clear Neck supple without adenopathy Heart RSR Lungs clear to a  A/ otitis media  P / Zpack . Supportive measures. F/U prn.

## 2015-03-31 ENCOUNTER — Other Ambulatory Visit: Payer: Self-pay | Admitting: *Deleted

## 2015-03-31 NOTE — Telephone Encounter (Signed)
Okay to refill? Last seen on 02/25/15 

## 2015-04-01 ENCOUNTER — Encounter: Payer: 59 | Admitting: Physical Therapy

## 2015-04-01 ENCOUNTER — Encounter: Payer: Self-pay | Admitting: Internal Medicine

## 2015-04-01 ENCOUNTER — Other Ambulatory Visit: Payer: Self-pay | Admitting: Family

## 2015-04-01 ENCOUNTER — Telehealth: Payer: Self-pay | Admitting: Family

## 2015-04-01 DIAGNOSIS — B373 Candidiasis of vulva and vagina: Secondary | ICD-10-CM

## 2015-04-01 DIAGNOSIS — B3731 Acute candidiasis of vulva and vagina: Secondary | ICD-10-CM

## 2015-04-01 MED ORDER — FLUCONAZOLE 150 MG PO TABS: 150.0000 mg | ORAL_TABLET | Freq: Once | ORAL | Status: DC

## 2015-04-01 NOTE — Telephone Encounter (Signed)
Yes I will send an rx for diflucan pills  Now.

## 2015-04-01 NOTE — Telephone Encounter (Signed)
This is the third request,  Answer is still NO.  I will not refill alprazolam short acting  While she is taking the Alprazolam ER.  Has nobody called to tell her that ? I am happy to refer her to psychiatry if she needs an increasd amount of benzodiazeipine

## 2015-04-02 NOTE — Telephone Encounter (Signed)
Left message to call office

## 2015-04-07 ENCOUNTER — Ambulatory Visit: Payer: Self-pay | Admitting: Family Medicine

## 2015-04-07 ENCOUNTER — Encounter: Payer: Self-pay | Admitting: Internal Medicine

## 2015-04-07 ENCOUNTER — Ambulatory Visit (INDEPENDENT_AMBULATORY_CARE_PROVIDER_SITE_OTHER): Payer: 59 | Admitting: Internal Medicine

## 2015-04-07 ENCOUNTER — Encounter: Payer: Self-pay | Admitting: Family Medicine

## 2015-04-07 VITALS — BP 158/80 | HR 96 | Temp 98.5°F | Resp 14 | Ht 63.0 in | Wt 183.4 lb

## 2015-04-07 DIAGNOSIS — H538 Other visual disturbances: Secondary | ICD-10-CM | POA: Diagnosis not present

## 2015-04-07 DIAGNOSIS — F5105 Insomnia due to other mental disorder: Secondary | ICD-10-CM | POA: Diagnosis not present

## 2015-04-07 DIAGNOSIS — F409 Phobic anxiety disorder, unspecified: Secondary | ICD-10-CM

## 2015-04-07 DIAGNOSIS — Z716 Tobacco abuse counseling: Secondary | ICD-10-CM

## 2015-04-07 DIAGNOSIS — E785 Hyperlipidemia, unspecified: Secondary | ICD-10-CM

## 2015-04-07 MED ORDER — ALPRAZOLAM 0.5 MG PO TABS: 0.5000 mg | ORAL_TABLET | Freq: Every evening | ORAL | Status: DC | PRN

## 2015-04-07 NOTE — Patient Instructions (Signed)
Your blurry vision may be due to the hydroxyzine you are taking for insomnia.  I have prescribed alprazolam to take instead at bedtime  If the blurry vision continues,  Try using Refresh or Systane Eye drops, since the blurriness may be from dry eye,   YOU DO NOT HAVE DIABETES.   You have lost 20 LBS since February    GREAT WORK!!!!

## 2015-04-07 NOTE — Progress Notes (Signed)
Pre-visit discussion using our clinic review tool. No additional management support is needed unless otherwise documented below in the visit note.  

## 2015-04-07 NOTE — Progress Notes (Signed)
Subjective:  Patient ID: Melanie Fisher, female    DOB: [birth year]  Age: 49 y.o. MRN: [id number]  CC: The primary encounter diagnosis was Blurred vision, bilateral. Diagnoses of Insomnia due to anxiety and fear, Tobacco abuse counseling, and Hyperlipidemia were also pertinent to this visit.  HPI Melanie Fisher presents for follow up on chronic issues  Including major depressive order complicated by generalized anxiety, hyperlipidemia, and back pain .   She has been having blurred vision which started 4 or 5  Weeks ago.  The symptoms have been Occurring at the end of the day.  No double vision.  Wear glasses.    Eye exam was ok  ,  Done by Heide Spark   Has been worried about DM due to [place] .  Of adult onset DM .  Has been checking BS  At various timew of the day and all have been < 15 random and < 130 fasting.   Has intentionally lost 20 lbs since february.   Depression with Anxiety:  Taking wellbutrin and citalopram.  using hydroxyzine for insomnia. Not taking lipitor due to statin intolerance.   Outpatient Prescriptions Prior to Visit  Medication Sig Dispense Refill  . albuterol (PROVENTIL HFA;VENTOLIN HFA) 108 (90 BASE) MCG/ACT inhaler Inhale 2 puffs into the lungs every 6 (six) hours as needed for wheezing or shortness of breath. 1 Inhaler 5  . azithromycin (ZITHROMAX Z-PAK) 250 MG tablet As directed 6 each 0  . buPROPion (WELLBUTRIN SR) 150 MG 12 hr tablet TAKE 1 TABLET BY MOUTH TWICE A DAY 60 tablet 2  . citalopram (CELEXA) 20 MG tablet Take 1 tablet (20 mg total) by mouth daily. 90 tablet 1  . fluconazole (DIFLUCAN) 150 MG tablet Take 1 tablet (150 mg total) by mouth once. May repeat in 2-3 days prn symptoms. 2 tablet 0  . gabapentin (NEURONTIN) 300 MG capsule Take 1 capsule (300 mg total) by mouth 3 (three) times daily. 90 capsule 1  . hydrOXYzine (ATARAX/VISTARIL) 25 MG tablet Take 1 tablet (25 mg total) by mouth every 8 (eight) hours as needed. 90 tablet 1  . isosorbide  mononitrate (IMDUR) 30 MG 24 hr tablet Take 1 tablet (30 mg total) by mouth daily. 1 tablet 6  . metoprolol succinate (TOPROL-XL) 25 MG 24 hr tablet Take 1 tablet (25 mg total) by mouth daily. At bedtime for blood pressure/headache prevention 90 tablet 3  . mometasone-formoterol (DULERA) 100-5 MCG/ACT AERO Inhale 2 puffs into the lungs 2 (two) times daily. 39 g 5  . nitroGLYCERIN (NITROSTAT) 0.4 MG SL tablet Place 1 tablet (0.4 mg total) under the tongue every 5 (five) minutes as needed for chest pain. 25 tablet 3  . omeprazole (PRILOSEC) 40 MG capsule TAKE 1 CAPSULE (40 MG TOTAL) BY MOUTH DAILY. 30 capsule 3  . traMADol (ULTRAM) 50 MG tablet Take 1 tablet (50 mg total) by mouth every 6 (six) hours as needed. 90 tablet 3  . valACYclovir (VALTREX) 500 MG tablet Take 1 tablet (500 mg total) by mouth 3 (three) times daily. 21 tablet 3  . varenicline (CHANTIX STARTING MONTH PAK) 0.5 MG X 11 & 1 MG X 42 tablet Take one 0.5 mg tablet by mouth once daily for 3 days, then increase to one 0.5 mg tablet twice daily for 4 days, then increase to one 1 mg tablet twice daily. 53 tablet 0  . Vitamin D, Ergocalciferol, (DRISDOL) 50000 UNITS CAPS capsule Take 1 capsule (50,000 Units total) by mouth every  7 (seven) days. 8 capsule 0  . zolpidem (AMBIEN CR) 12.5 MG CR tablet Take 1 tablet (12.5 mg total) by mouth at bedtime as needed. for sleep 30 tablet 3  . ALPRAZolam (XANAX) 0.5 MG tablet TAKE ONE TABLET BY MOUTH DAILY AS NEEDED FOR EXTREME ANXIETY(IN ADDITION TO XR) 30 tablet 2  . atorvastatin (LIPITOR) 20 MG tablet Take 1 tablet (20 mg total) by mouth daily. 90 tablet 3   No facility-administered medications prior to visit.    Review of Systems;  Patient denies headache, fevers, malaise, unintentional weight loss, skin rash, eye pain, sinus congestion and sinus pain, sore throat, dysphagia,  hemoptysis , cough, dyspnea, wheezing, chest pain, palpitations, orthopnea, edema, abdominal pain, nausea, melena,  diarrhea, constipation, flank pain, dysuria, hematuria, urinary  Frequency, nocturia, numbness, tingling, seizures,  Focal weakness, Loss of consciousness,  Tremor, insomnia, depression, anxiety, and suicidal ideation.      Objective:  BP 158/80 mmHg  Pulse 96  Temp(Src) 98.5 F (36.9 C) (Oral)  Resp 14  Ht 5\' 3"  (1.6 m)  Wt 183 lb 6 oz (83.178 kg)  BMI 32.49 kg/m2  SpO2 98%  BP Readings from Last 3 Encounters:  04/07/15 158/80  03/30/15 120/80  03/10/15 114/84    Wt Readings from Last 3 Encounters:  04/07/15 183 lb 6 oz (83.178 kg)  03/30/15 187 lb (84.823 kg)  03/10/15 192 lb (87.091 kg)    General appearance: alert, cooperative and appears stated age Ears: normal TM's and external ear canals both ears Throat: lips, mucosa, and tongue normal; teeth and gums normal Neck: no adenopathy, no carotid bruit, supple, symmetrical, trachea midline and thyroid not enlarged, symmetric, no tenderness/mass/nodules Back: symmetric, no curvature. ROM normal. No CVA tenderness. Lungs: clear to auscultation bilaterally Heart: regular rate and rhythm, S1, S2 normal, no murmur, click, rub or gallop Abdomen: soft, non-tender; bowel sounds normal; no masses,  no organomegaly Pulses: 2+ and symmetric Skin: Skin color, texture, turgor normal. No rashes or lesions Lymph nodes: Cervical, supraclavicular, and axillary nodes normal.  Lab Results  Component Value Date   HGBA1C 5.4 04/09/2015   HGBA1C 5.7 05/07/2012    Lab Results  Component Value Date   CREATININE 0.72 04/09/2015   CREATININE 0.82 08/18/2014   CREATININE 0.7 11/18/2013    Lab Results  Component Value Date   WBC 4.3 11/18/2013   HGB 13.8 11/18/2013   HCT 40.7 11/18/2013   PLT 195.0 11/18/2013   GLUCOSE 95 04/09/2015   CHOL 168 04/09/2015   TRIG 64 04/09/2015   HDL 62 04/09/2015   LDLCALC 93 04/09/2015   ALT 24 04/09/2015   AST 24 04/09/2015   NA 141 04/09/2015   K 4.0 04/09/2015   CL 108 04/09/2015    CREATININE 0.72 04/09/2015   BUN 16 04/09/2015   CO2 24 04/09/2015   TSH 1.63 02/10/2015   HGBA1C 5.4 04/09/2015    Mr Cervical Spine Wo Contrast  01/29/2015  CLINICAL DATA:  Persistent neck pain with radiculopathy to the right hand. Headaches for 3 weeks. Neck pain and tingling in the shoulders and arms, worse on the left. EXAM: MRI CERVICAL SPINE WITHOUT CONTRAST TECHNIQUE: Multiplanar, multisequence MR imaging of the cervical spine was performed. No intravenous contrast was administered. COMPARISON:  Cervical spine radiographs 12/09/2014 FINDINGS: Vertebral alignment is normal. Vertebral body heights are preserved. Intervertebral disc space heights are relatively well preserved, with minimal narrowing at C4-5. No significant vertebral marrow edema is seen. Craniocervical junction is unremarkable. Cervical spinal  cord is normal in caliber and signal. Paraspinal soft tissues are unremarkable. C2-3:  Small central disc protrusion without stenosis. C3-4: Broad central disc protrusion results in mild spinal stenosis without significant mass effect on the spinal cord. No neural foraminal stenosis. C4-5: Mild disc bulging and uncovertebral spurring without stenosis. C5-6: Mild disc bulging and uncovertebral spurring without stenosis. C6-7: Small left central disc extrusion results in mild spinal stenosis with at most a minimal impression on the ventral spinal cord. No neural foraminal stenosis. C7-T1:  Negative. IMPRESSION: 1. Mild spinal stenosis at C3-4 and C6-7 due to small disc herniations. 2. Mild disc degeneration elsewhere without stenosis. Electronically Signed   By: Logan Bores   On: 01/29/2015 17:29    Assessment & Plan:   Problem List Items Addressed This Visit    Hyperlipidemia    Untreated secondary to statin intolerance.       Tobacco abuse counseling    The patient was counseled on the dangers of tobacco use, and was advised to quit.  Reviewed strategies to maximize success, including  stress management.      Blurred vision, bilateral - Primary    Advised to suspend hydroxyzine as this may be S/E.  Patient is requesting screening DM labs,  A1c ordered.       Insomnia due to anxiety and fear    Trial of alprazolam sicne hydroxyzine has been discontinued.  The risks and benefits of benzodiazepine use were discussed with patient today including excessive sedation leading to respiratory depression,  impaired thinking/driving, and addiction.  Patient was advised to avoid concurrent use with alcohol, to use medication only as needed and not to share with others  .           I have discontinued Ms. Fisher's ALPRAZolam and atorvastatin. I am also having her start on ALPRAZolam. Additionally, I am having her maintain her albuterol, mometasone-formoterol, nitroGLYCERIN, metoprolol succinate, zolpidem, varenicline, buPROPion, valACYclovir, citalopram, isosorbide mononitrate, Vitamin D (Ergocalciferol), omeprazole, gabapentin, hydrOXYzine, traMADol, and fluconazole.  Meds ordered this encounter  Medications  . ALPRAZolam (XANAX) 0.5 MG tablet    Sig: Take 1 tablet (0.5 mg total) by mouth at bedtime as needed for anxiety.    Dispense:  30 tablet    Refill:  3    Medications Discontinued During This Encounter  Medication Reason  . ALPRAZolam (XANAX) 0.5 MG tablet   . atorvastatin (LIPITOR) 20 MG tablet Side effect (s)    Follow-up: No Follow-up on file.   Crecencio Mc, MD

## 2015-04-08 ENCOUNTER — Encounter: Payer: Self-pay | Admitting: Internal Medicine

## 2015-04-09 ENCOUNTER — Other Ambulatory Visit
Admission: RE | Admit: 2015-04-09 | Discharge: 2015-04-09 | Disposition: A | Discharge status: Home / Self Care | Payer: 59 | Source: Ambulatory Visit | Attending: Internal Medicine | Admitting: Internal Medicine

## 2015-04-09 DIAGNOSIS — E669 Obesity, unspecified: Secondary | ICD-10-CM | POA: Diagnosis present

## 2015-04-09 DIAGNOSIS — I10 Essential (primary) hypertension: Secondary | ICD-10-CM | POA: Diagnosis present

## 2015-04-09 DIAGNOSIS — Z131 Encounter for screening for diabetes mellitus: Secondary | ICD-10-CM | POA: Insufficient documentation

## 2015-04-09 LAB — COMPREHENSIVE METABOLIC PANEL
ALT: 24 U/L (ref 14–54)
AST: 24 U/L (ref 15–41)
Albumin: 4.7 g/dL (ref 3.5–5.0)
Alkaline Phosphatase: 63 U/L (ref 38–126)
Anion gap: 9 (ref 5–15)
BUN: 16 mg/dL (ref 6–20)
CO2: 24 mmol/L (ref 22–32)
Calcium: 9.5 mg/dL (ref 8.9–10.3)
Chloride: 108 mmol/L (ref 101–111)
Creatinine, Ser: 0.72 mg/dL (ref 0.44–1.00)
GFR calc Af Amer: >60 mL/min (ref >60)
GFR calc non Af Amer: >60 mL/min (ref >60)
Glucose, Bld: 95 mg/dL (ref 65–99)
Potassium: 4 mmol/L (ref 3.5–5.1)
Sodium: 141 mmol/L (ref 135–145)
Total Bilirubin: 0.4 mg/dL (ref 0.3–1.2)
Total Protein: 7.9 g/dL (ref 6.5–8.1)

## 2015-04-09 LAB — LIPID PANEL
Cholesterol: 168 mg/dL (ref 0–200)
HDL: 62 mg/dL (ref >40)
LDL Cholesterol: 93 mg/dL (ref 0–99)
Total CHOL/HDL Ratio: 2.7 RATIO
Triglycerides: 64 mg/dL (ref <150)
VLDL: 13 mg/dL (ref 0–40)

## 2015-04-09 LAB — HEMOGLOBIN A1C: Hgb A1c MFr Bld: 5.4 % (ref 4.0–6.0)

## 2015-04-10 DIAGNOSIS — F5105 Insomnia due to other mental disorder: Secondary | ICD-10-CM | POA: Insufficient documentation

## 2015-04-10 DIAGNOSIS — F409 Phobic anxiety disorder, unspecified: Secondary | ICD-10-CM | POA: Insufficient documentation

## 2015-04-10 DIAGNOSIS — H538 Other visual disturbances: Secondary | ICD-10-CM | POA: Insufficient documentation

## 2015-04-10 NOTE — Assessment & Plan Note (Signed)
Untreated secondary to statin intolerance.

## 2015-04-10 NOTE — Assessment & Plan Note (Addendum)
Advised to suspend hydroxyzine as this may be S/E.  Patient is requesting screening DM labs,  A1c ordered.

## 2015-04-10 NOTE — Assessment & Plan Note (Signed)
Trial of alprazolam sicne hydroxyzine has been discontinued.  The risks and benefits of benzodiazepine use were discussed with patient today including excessive sedation leading to respiratory depression,  impaired thinking/driving, and addiction.  Patient was advised to avoid concurrent use with alcohol, to use medication only as needed and not to share with others  .

## 2015-04-10 NOTE — Assessment & Plan Note (Signed)
The patient was counseled on the dangers of tobacco use, and was advised to quit.  Reviewed strategies to maximize success, including stress management.

## 2015-04-11 ENCOUNTER — Encounter: Payer: Self-pay | Admitting: Internal Medicine

## 2015-04-14 ENCOUNTER — Ambulatory Visit: Payer: Self-pay | Admitting: Family Medicine

## 2015-04-20 ENCOUNTER — Encounter: Payer: Self-pay | Admitting: Family Medicine

## 2015-04-20 ENCOUNTER — Ambulatory Visit (INDEPENDENT_AMBULATORY_CARE_PROVIDER_SITE_OTHER): Payer: 59 | Admitting: Family Medicine

## 2015-04-20 VITALS — BP 128/82 | HR 76 | Wt 183.0 lb

## 2015-04-20 DIAGNOSIS — M5010 Cervical disc disorder with radiculopathy, unspecified cervical region: Secondary | ICD-10-CM

## 2015-04-20 DIAGNOSIS — M25512 Pain in left shoulder: Secondary | ICD-10-CM

## 2015-04-20 NOTE — Assessment & Plan Note (Signed)
Patient continues to have more radiculopathy. Patient's MRI does show some mild spinal stenosis from the neck than could be contribute in. I do feel that this could be causing some of the neuropathy type symptoms that she continues to have as well as muscle spasms. I do think an epidural injection at C7-T1 could be hopefully diagnostic as well as therapeutic. This will tell us. Patient has had a shoulder injection with no significant improvement in the symptoms. Patient's labs were fairly unremarkable. Likely knee injury that occurred has caused an underlying exacerbation of a problem that was existing. Patient come back 2 weeks after the epidural and we'll discuss further. Patient will continue the conservative therapy as well as the medications.  Spent  25 minutes with patient face-to-face and had greater than 50% of counseling including as described above in assessment and plan.
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
## 2015-04-20 NOTE — Patient Instructions (Signed)
Good to se you Epidural of your neck today.  Ice is your friend Try to continue to work on posture On wall with heels, butt shoulder and head touching for a goal of 5 minutes daily  See me again in 2 weeks after the epidural.

## 2015-05-07 ENCOUNTER — Inpatient Hospital Stay: Admission: RE | Admit: 2015-05-07 | Payer: Self-pay | Source: Ambulatory Visit

## 2015-05-07 ENCOUNTER — Ambulatory Visit: Payer: 59

## 2015-05-10 ENCOUNTER — Telehealth: Payer: Self-pay | Admitting: *Deleted

## 2015-05-10 ENCOUNTER — Other Ambulatory Visit: Payer: Self-pay

## 2015-05-10 MED ORDER — OMEPRAZOLE 40 MG PO CPDR: DELAYED_RELEASE_CAPSULE | ORAL | Status: DC

## 2015-05-10 MED ORDER — METOPROLOL SUCCINATE ER 25 MG PO TB24: 25.0000 mg | ORAL_TABLET | Freq: Every day | ORAL | Status: DC

## 2015-05-10 MED ORDER — ISOSORBIDE MONONITRATE ER 30 MG PO TB24: 30.0000 mg | ORAL_TABLET | Freq: Every day | ORAL | Status: DC

## 2015-05-10 MED ORDER — CITALOPRAM HYDROBROMIDE 20 MG PO TABS: 20.0000 mg | ORAL_TABLET | Freq: Every day | ORAL | Status: DC

## 2015-05-10 MED ORDER — BUPROPION HCL ER (SR) 150 MG PO TB12: 150.0000 mg | ORAL_TABLET | Freq: Two times a day (BID) | ORAL | Status: DC

## 2015-05-10 NOTE — Telephone Encounter (Signed)
Completed,.  

## 2015-05-10 NOTE — Telephone Encounter (Signed)
Patient requested a 90 day supply for Wellbutrin, isosorbide, lisinopril, atrovastatine, meprazol

## 2016-02-29 IMAGING — CT CT ABD-PELV W/ CM
2 of 5 series · 16 of 46 positions shown, 18 images · IV contrast (agent unspecified)
Comparison: Abdominal ultrasound 11/24/2013

CLINICAL DATA: Right-sided abdominal plain with bloating and
constipation/ diarrhea for for which. No history of cancer.

EXAM:
CT ABDOMEN AND PELVIS WITH CONTRAST
TECHNIQUE: Multidetector CT imaging of the abdomen and pelvis was performed
using the standard protocol following bolus administration of
intravenous contrast.
CONTRAST:  100 mL [id number]

[Series 2: routine abd pel with · axial · 0.72mm/px · z∈[-926,-500]mm · 13 of 97 slices shown, 15 images]
[im 6/97  soft-tissue]
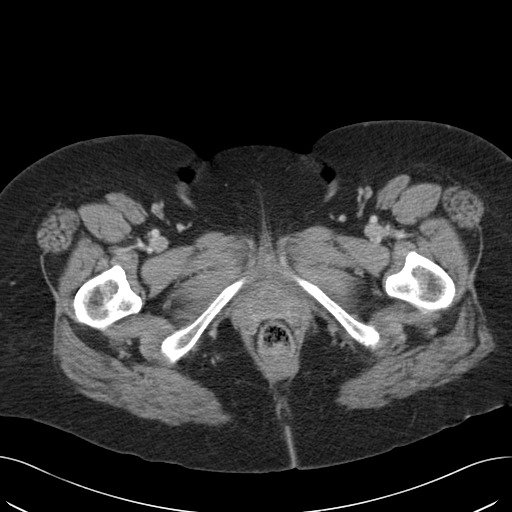
[im 6/97  bone]
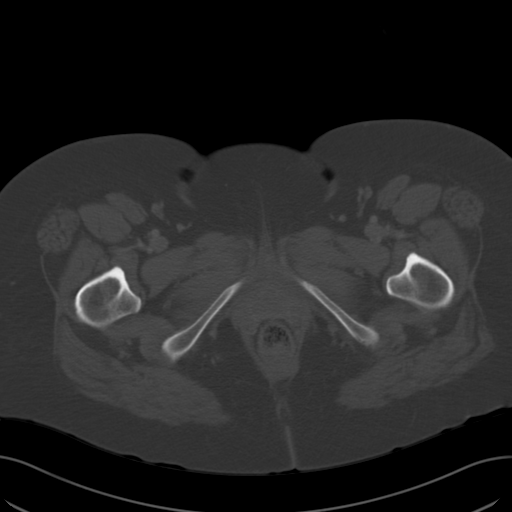
[im 11/97  soft-tissue]
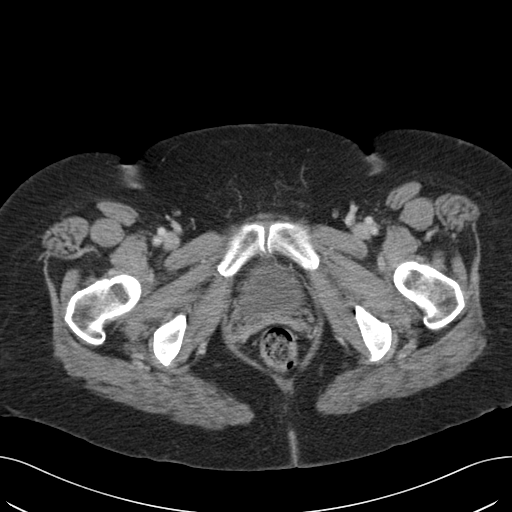
[im 22/97  soft-tissue]
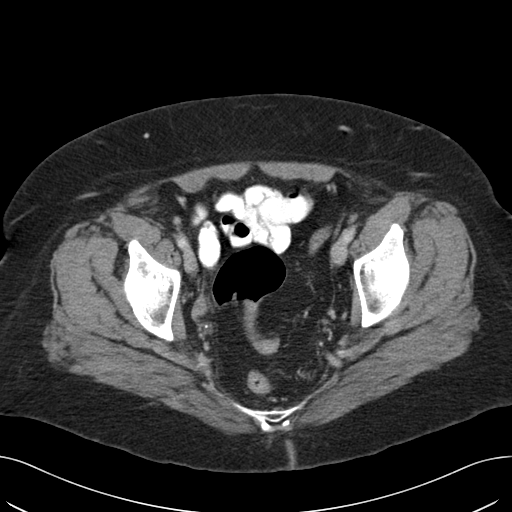
[im 27/97  soft-tissue]
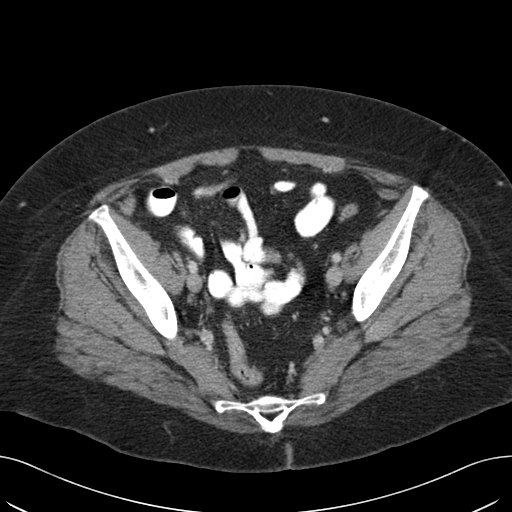
[im 33/97  soft-tissue]
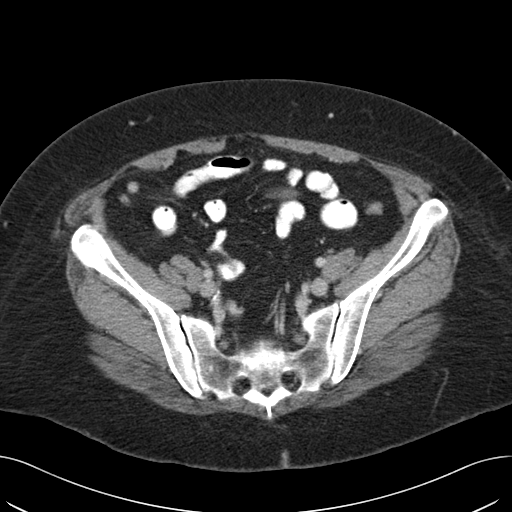
[im 43/97  soft-tissue]
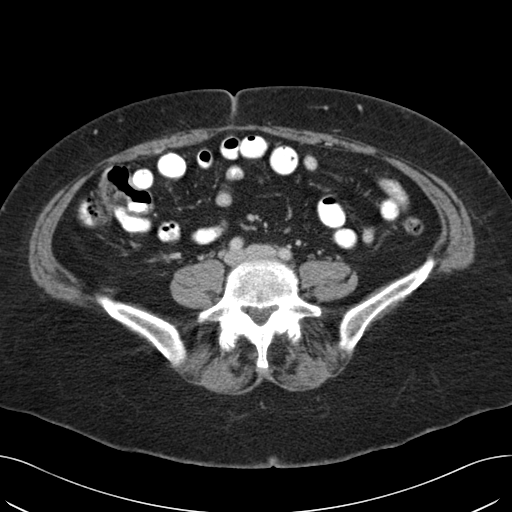
[im 49/97  soft-tissue]
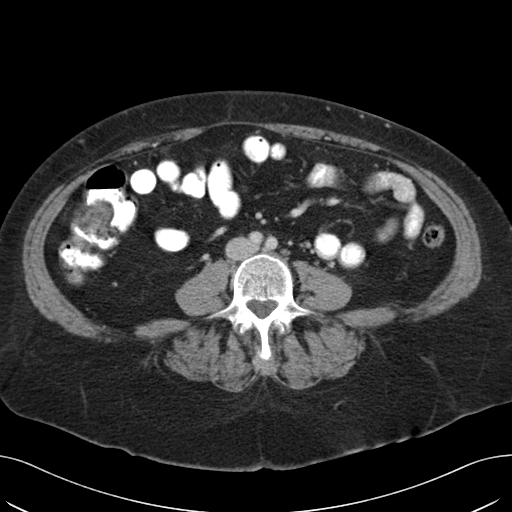
[im 54/97  soft-tissue]
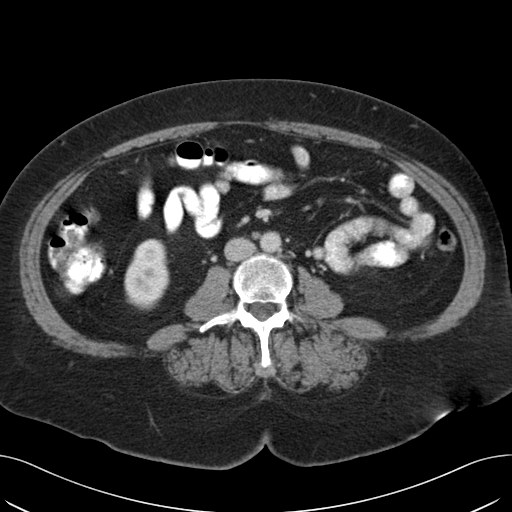
[im 65/97  soft-tissue]
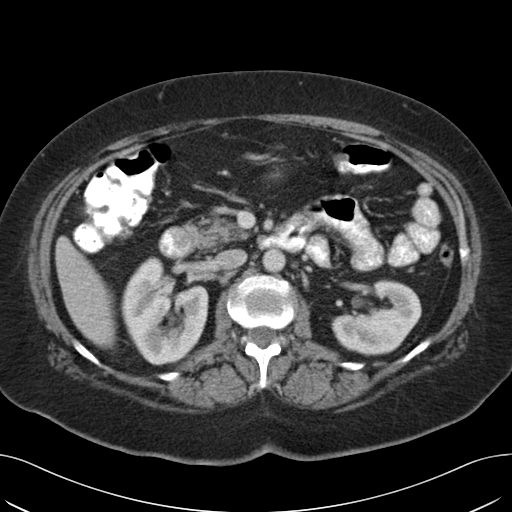
[im 65/97  bone]
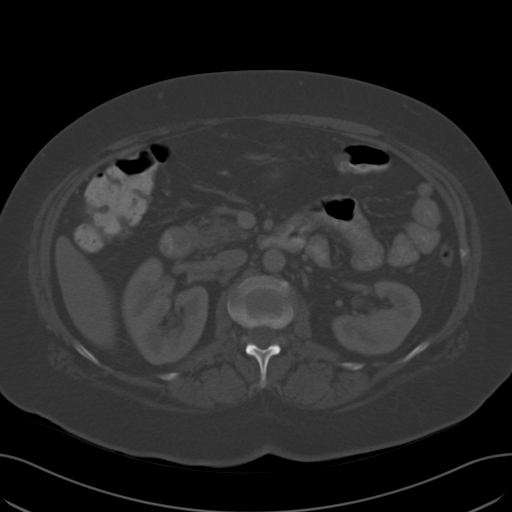
[im 70/97  soft-tissue]
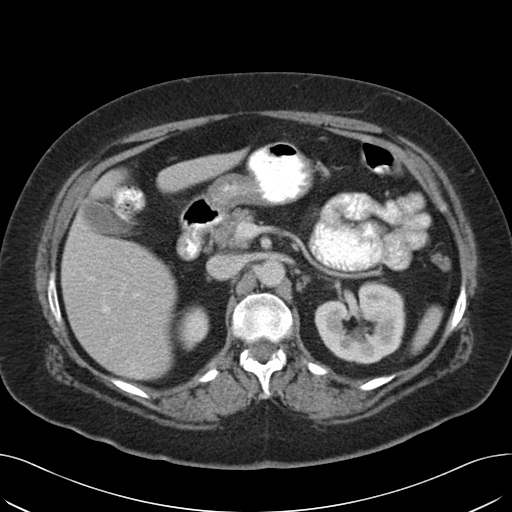
[im 75/97  soft-tissue]
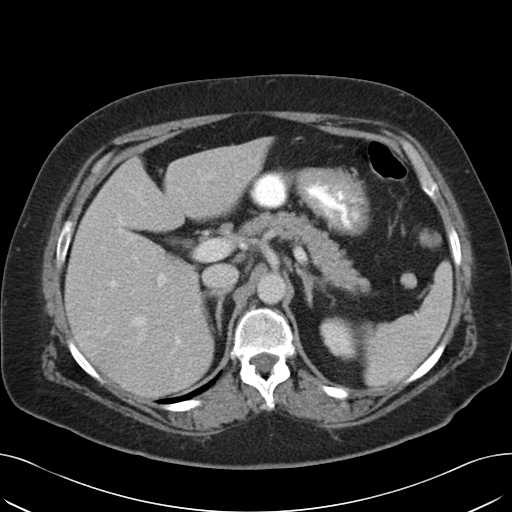
[im 86/97  soft-tissue]
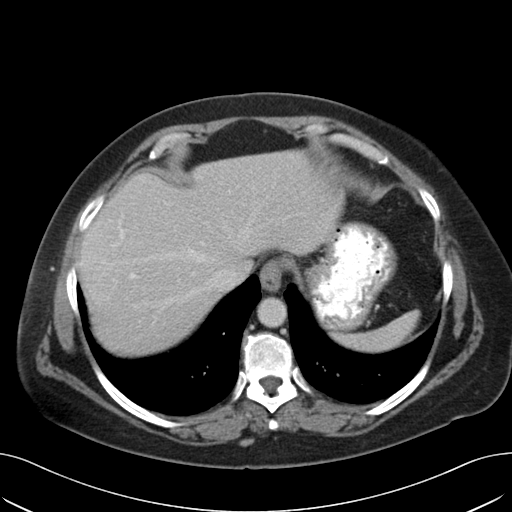
[im 91/97  soft-tissue]
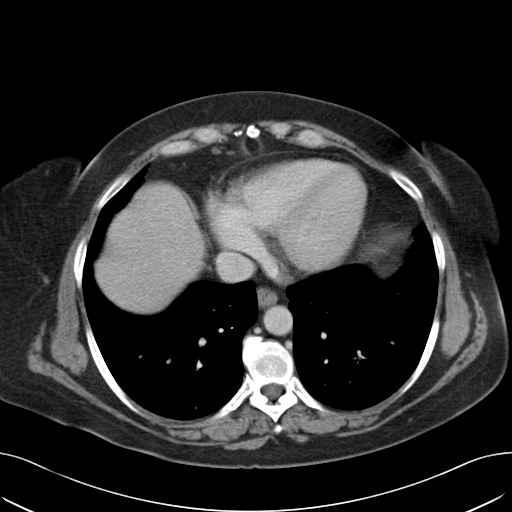

[Series 5: cor routine abd pel with · coronal · 0.90mm/px · 3 of 150 slices shown]
[im 50/150  soft-tissue]
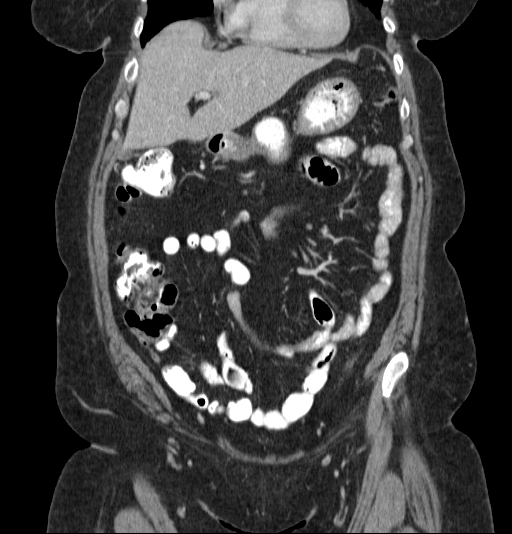
[im 67/150  soft-tissue]
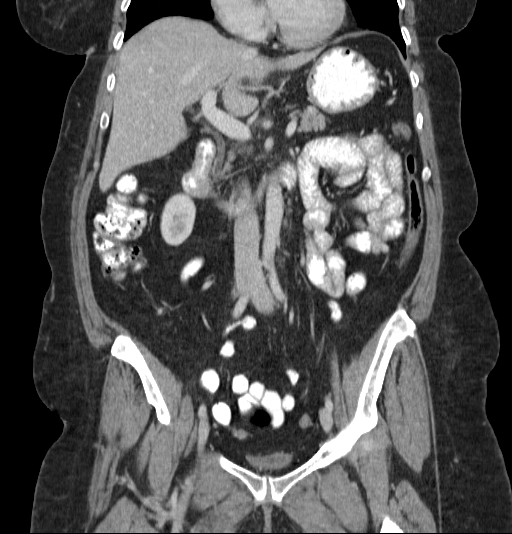
[im 83/150  soft-tissue]
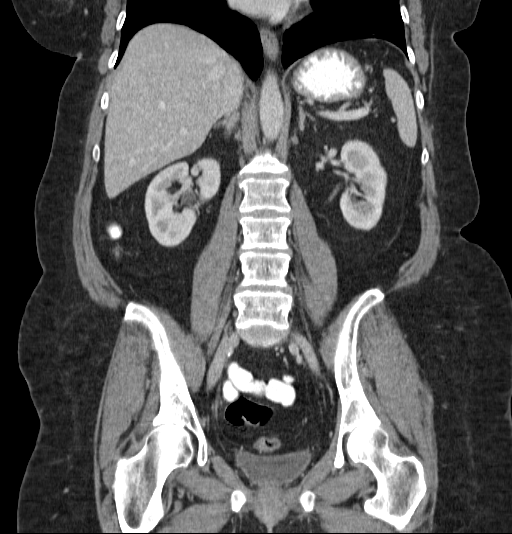

[16 of 46 positions shown; findings below may reference images not displayed]

FINDINGS: There is dependent atelectasis in the lower lobes at the lung bases.
Otherwise, the lung bases are clear.

The liver, gallbladder, spleen, adrenal glands, pancreas, and
kidneys are within normal limits. The ureters are normal in caliber.
Urinary bladder is not very distended at the time imaging, and
appears within normal limits.

The stomach is well distended with oral contrast and appears normal.
Small bowel loops are normal in caliber and wall thickness and are
well opacified with oral contrast. Normal appearance of the terminal
ileum.

The colon is normal in caliber. In the ascending colon, just distal
to the ileocecal valve, is a focal area of soft tissue thickening
which could reflect a prominent haustra, but it is asymmetrically
prominent compared to the remainder the patient's house dropped. A
colonic polyp or malignancy cannot be excluded by CT. See image
number 50 of axial images and image number 57 of the coronal images.
This soft tissue prominence measures 2.3 x 2.1 cm. No evidence of
colonic obstruction. No significant stool burden. Rectum is
unremarkable. Normal appendix.

The abdominal aorta is normal in caliber and the branch vessels are
patent. Negative for retroperitoneal or mesenteric lymphadenopathy.
Hysterectomy. No adnexal mass. Inguinal regions normal. Soft tissues
of the body wall are unremarkable.

No acute or suspicious bony abnormality. Facet joint degenerative
changes are present of the lower lumbar spine.
IMPRESSION: 1. Soft tissue fullness focally in the proximal ascending colon
could reflect a prominent haustra of/focal area of peristalsis, but
a mass lesion is difficult to completely exclude by CT imaging.
Further evaluation with colonoscopy should be considered.
2. Otherwise, no additional acute or suspicious findings identified
in the abdomen or pelvis.

## 2016-03-05 IMAGING — CR DG ABDOMEN 2V
1 series · 4 of 4 positions shown · non-contrast
Comparison: CT 01/13/2014

CLINICAL DATA: Right-sided abdominal pain and constipation for 1
week

EXAM:
ABDOMEN - 2 VIEW

[Series 1: t abdomen supine · 0.14mm/px · 4 of 4 slices shown]
[im 1/4]
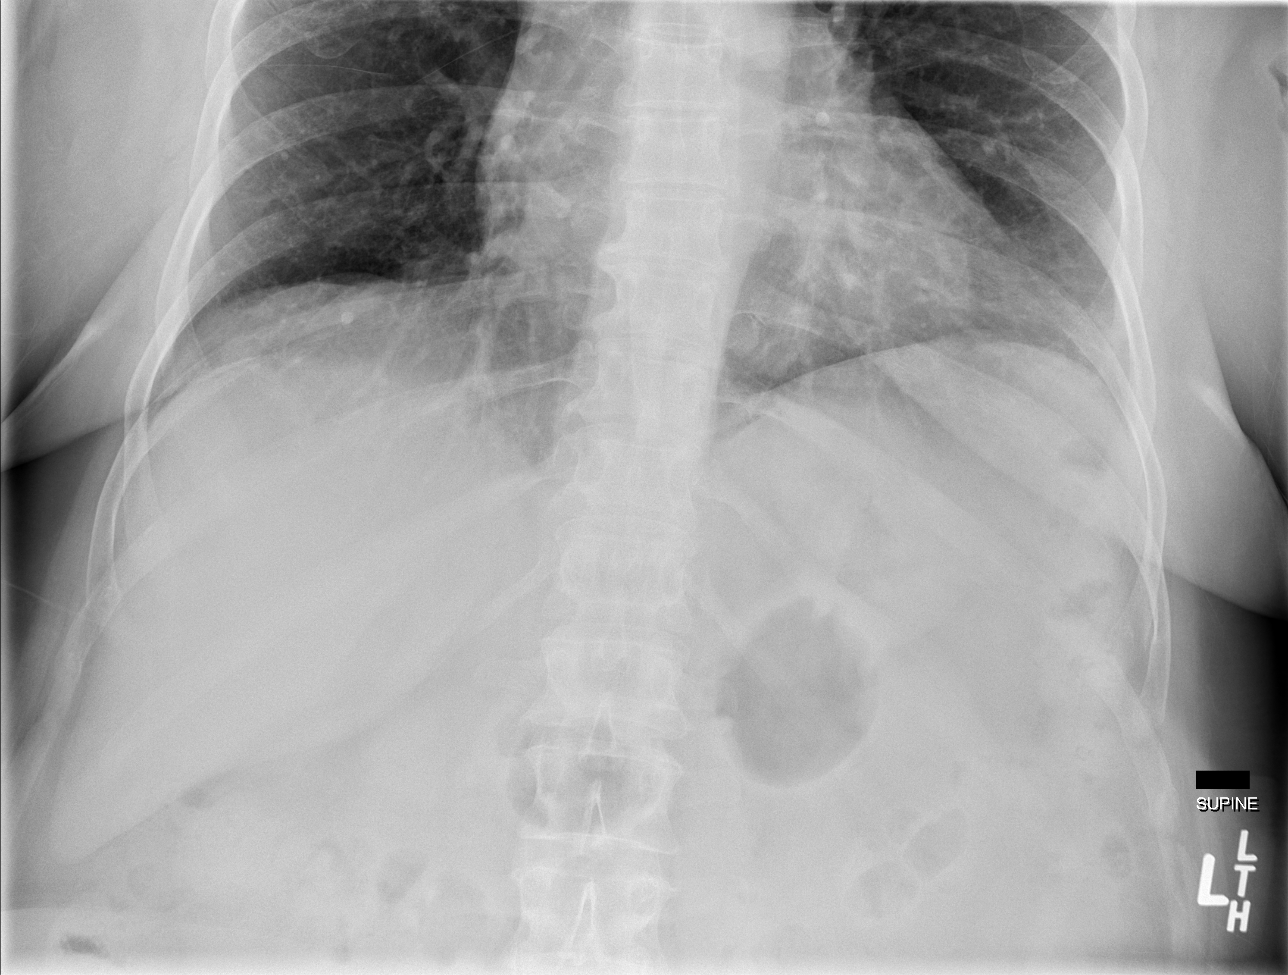
[im 2/4]
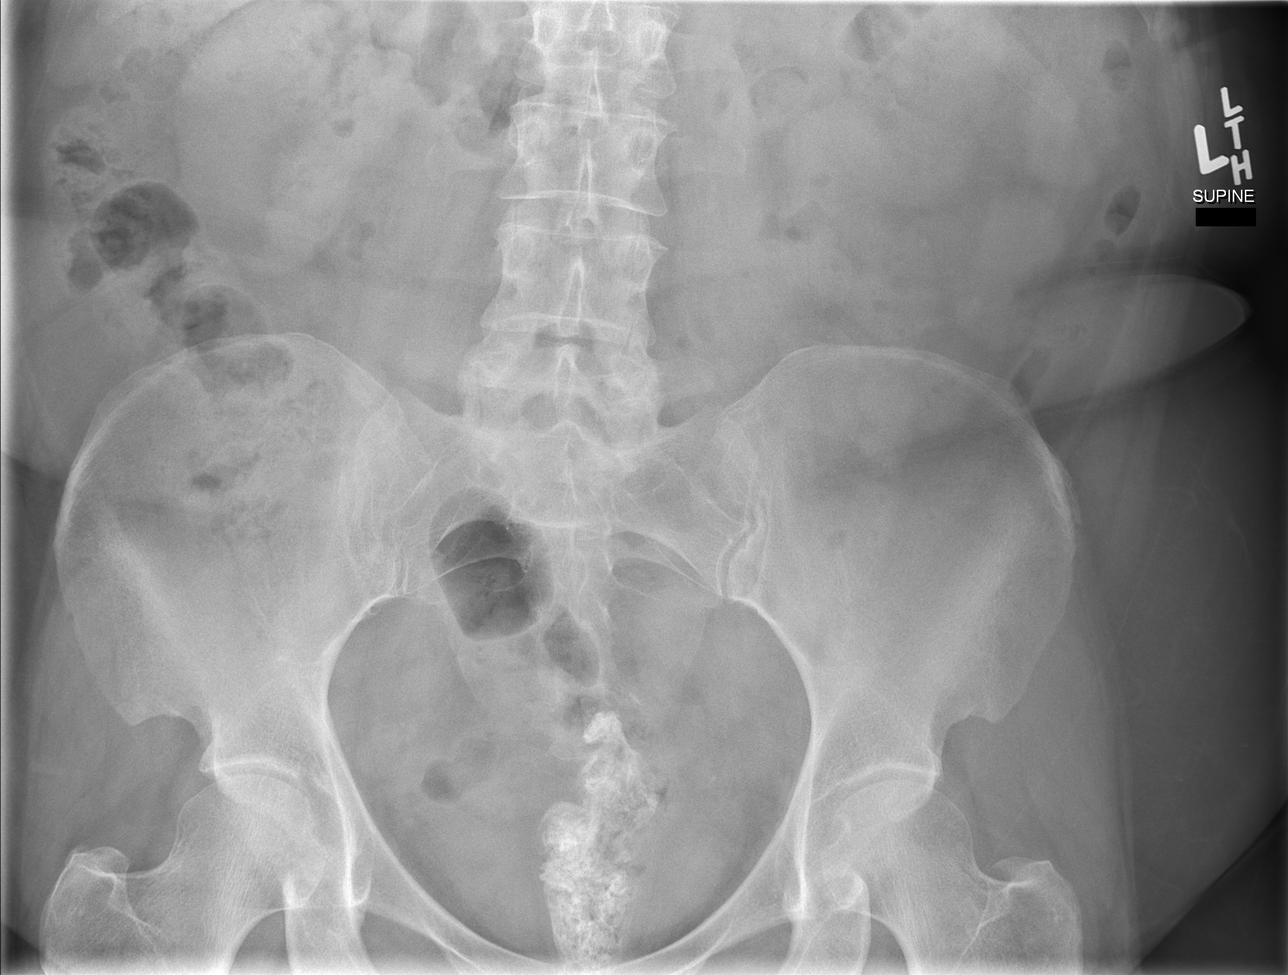
[im 3/4]
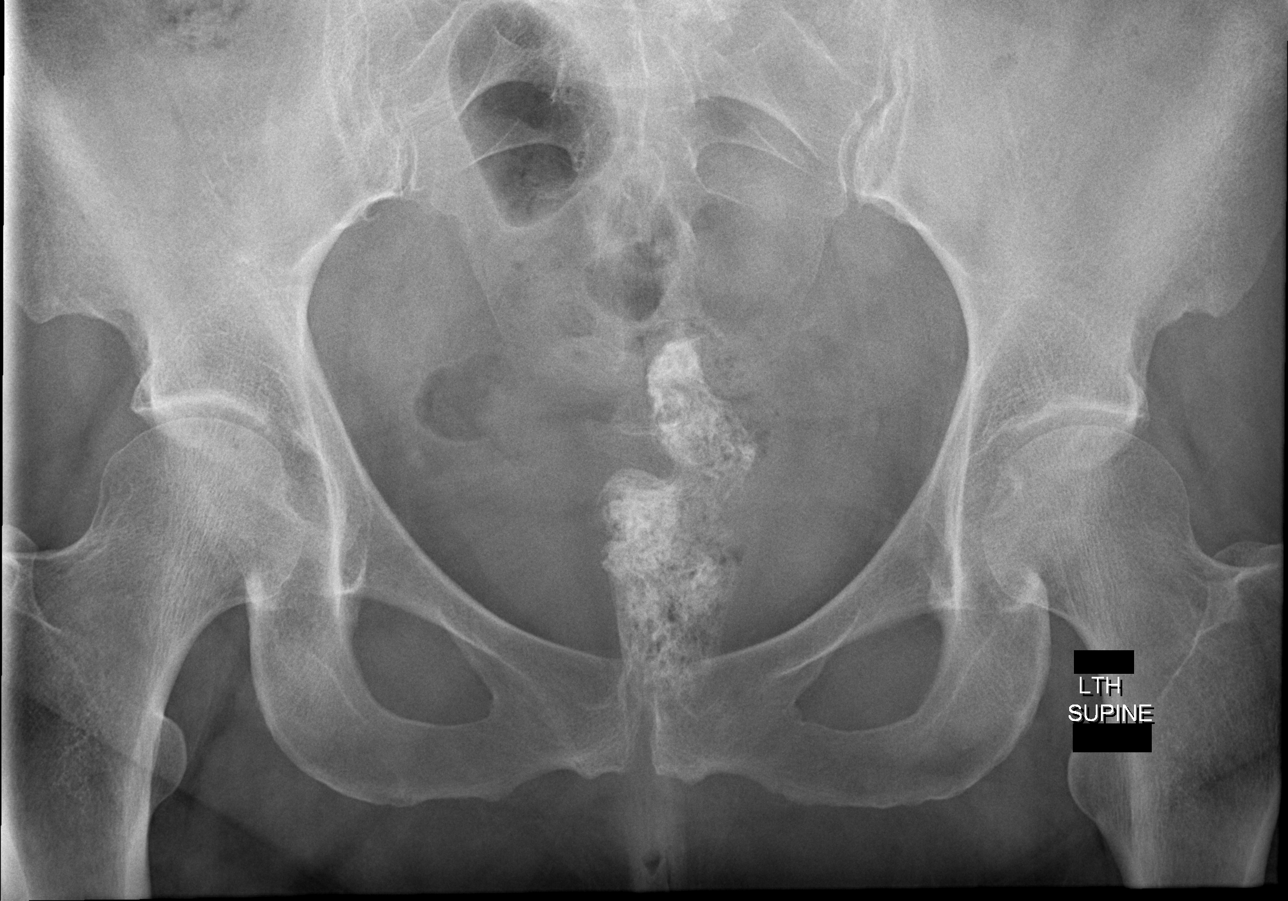
[im 4/4]
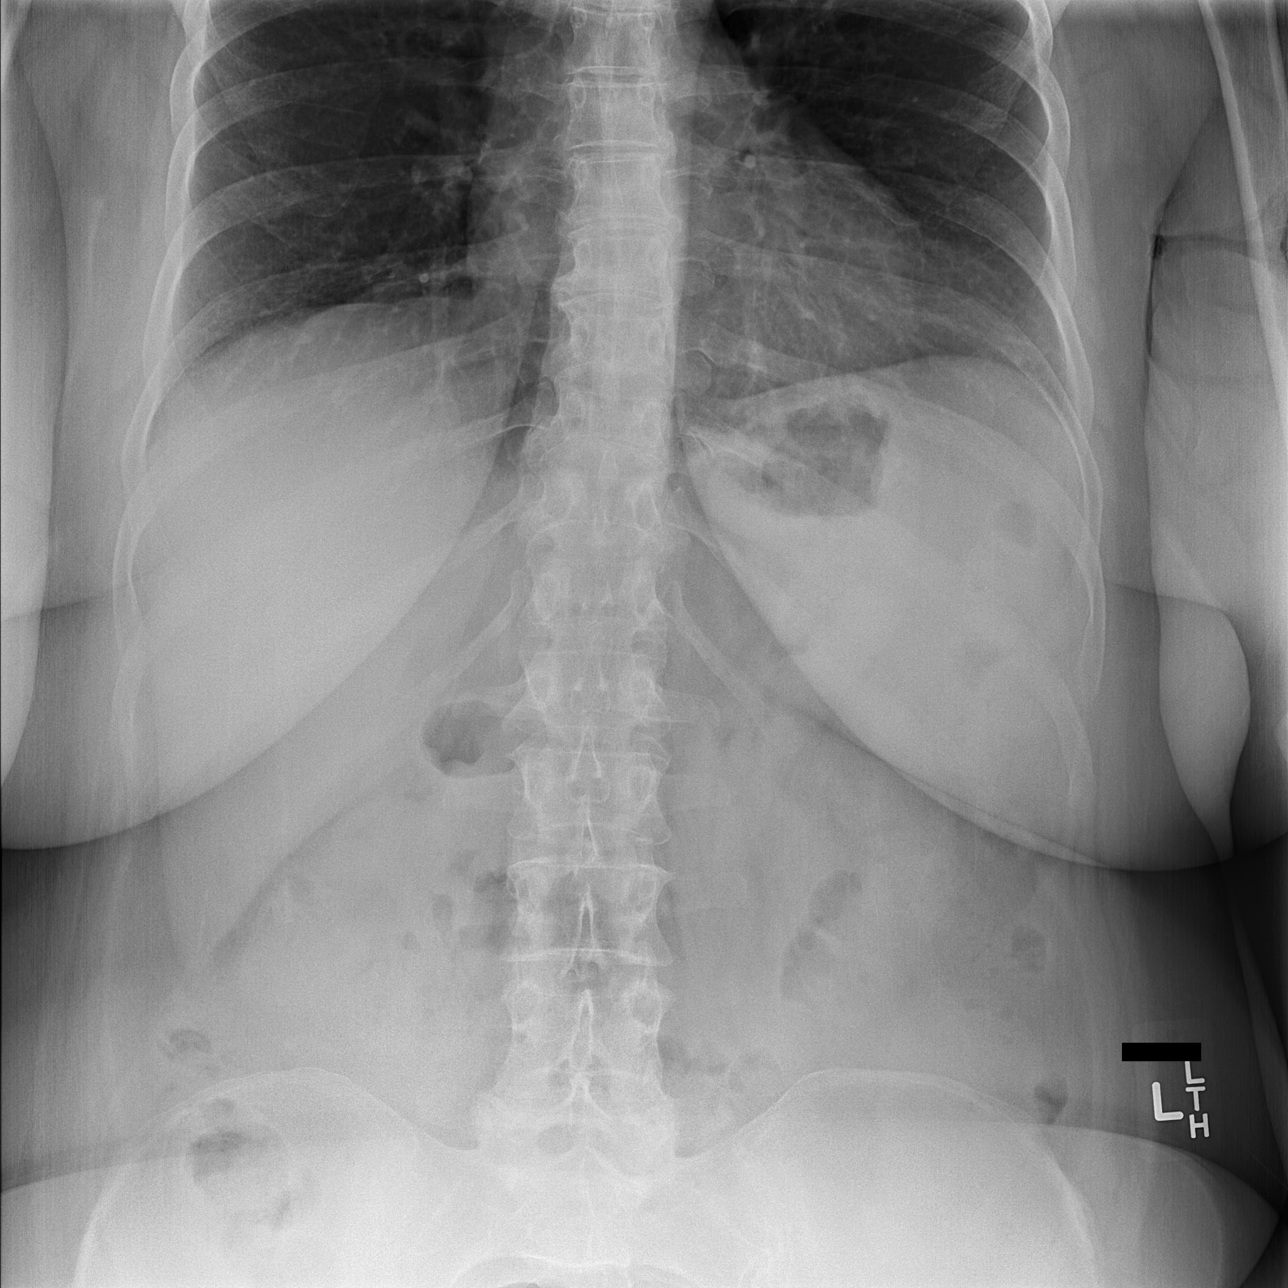

[4 of 4 positions shown; findings below may reference images not displayed]

FINDINGS: The bowel gas pattern is normal. There is no evidence of free air.
No radio-opaque calculi or other significant radiographic
abnormality is seen. Minimal retained contrast is noted over the
nondilated rectosigmoid colon from prior exam.
IMPRESSION: Negative.

## 2016-03-06 ENCOUNTER — Telehealth: Payer: Self-pay | Admitting: Internal Medicine

## 2016-03-06 NOTE — Telephone Encounter (Signed)
Pt called needing a refill for citalopram (CELEXA) 20 MG tablet and buPROPion (WELLBUTRIN SR) 150 MG 12 hr tablet.   Pharmacy is [facility] nurse in [place]. Thank you!  Call pt @ [phone].

## 2016-03-07 MED ORDER — BUPROPION HCL ER (SR) 150 MG PO TB12: 150.0000 mg | ORAL_TABLET | Freq: Two times a day (BID) | ORAL | 0 refills | Status: DC

## 2016-03-07 MED ORDER — CITALOPRAM HYDROBROMIDE 20 MG PO TABS: 20.0000 mg | ORAL_TABLET | Freq: Every day | ORAL | 0 refills | Status: DC

## 2016-03-07 NOTE — Telephone Encounter (Signed)
Refills sent

## 2016-03-08 NOTE — Telephone Encounter (Signed)
Pt called stating that pharmacy states they  do not have the Rx's from yesterday. I did advise pt that it was sent electronic yesterday pt is calling back to [place]. Please advise?

## 2016-03-08 NOTE — Telephone Encounter (Signed)
Both scripts are showing confirmed by pharmacy, in [place].  Delivery date 03/07/16 @ 11.44 am.

## 2016-03-08 NOTE — Telephone Encounter (Signed)
Ok. I called pt with the information and pt understood information.

## 2016-03-21 ENCOUNTER — Ambulatory Visit: Payer: Self-pay | Admitting: Internal Medicine

## 2016-03-21 DIAGNOSIS — Z0289 Encounter for other administrative examinations: Secondary | ICD-10-CM

## 2016-05-08 ENCOUNTER — Telehealth: Payer: Self-pay | Admitting: *Deleted

## 2016-05-08 NOTE — Telephone Encounter (Signed)
[facility]  [facility], pt fell and sustained a cut on her left grand toe and a hip injury. She was advised to follow up with her PCP. Please give a time and date to place pt.  Pt contact [phone]

## 2016-05-08 NOTE — Telephone Encounter (Signed)
05/15/16 @ 6 Please call patient.

## 2016-05-15 ENCOUNTER — Ambulatory Visit: Payer: Self-pay | Admitting: Internal Medicine

## 2016-05-17 ENCOUNTER — Telehealth: Payer: Self-pay | Admitting: Internal Medicine

## 2016-05-17 NOTE — Telephone Encounter (Signed)
Pt called about her right thigh it feels warm pt fell on the 05/08/16 at home. Pt wants to know if it's normal for it to be warm and it was bruised. Blood is going down and now traveling to different spots. Pt cannot afford to go to the ER due to her husband losing his job. Pt was transferred to Nurse line. Please advise?   Call pt @ [phone]. Thank you!

## 2016-05-18 NOTE — Telephone Encounter (Signed)
Please call pt at [phone]

## 2016-05-18 NOTE — Telephone Encounter (Signed)
Spoke with patient states she received phone call from our office , however her phone was on vibrate.   She states that her leg is not as warm to touch to touch but still feels warm.  She is going to urgent care to have it evaluated to make sure there isn't a blood clot there.     She has agreed to go to [facility].

## 2016-05-18 NOTE — Telephone Encounter (Signed)
Left message to return call to office.

## 2016-05-23 ENCOUNTER — Encounter: Payer: Self-pay | Admitting: Internal Medicine

## 2016-05-23 ENCOUNTER — Ambulatory Visit (INDEPENDENT_AMBULATORY_CARE_PROVIDER_SITE_OTHER): Payer: BC Managed Care – PPO | Admitting: Internal Medicine

## 2016-05-23 VITALS — BP 138/88 | HR 106 | Temp 98.3°F | Resp 12 | Ht 63.0 in | Wt 192.8 lb

## 2016-05-23 DIAGNOSIS — E7800 Pure hypercholesterolemia, unspecified: Secondary | ICD-10-CM | POA: Diagnosis not present

## 2016-05-23 DIAGNOSIS — R5383 Other fatigue: Secondary | ICD-10-CM | POA: Diagnosis not present

## 2016-05-23 DIAGNOSIS — Z716 Tobacco abuse counseling: Secondary | ICD-10-CM

## 2016-05-23 DIAGNOSIS — E559 Vitamin D deficiency, unspecified: Secondary | ICD-10-CM

## 2016-05-23 DIAGNOSIS — Z23 Encounter for immunization: Secondary | ICD-10-CM

## 2016-05-23 DIAGNOSIS — I10 Essential (primary) hypertension: Secondary | ICD-10-CM

## 2016-05-23 DIAGNOSIS — F409 Phobic anxiety disorder, unspecified: Secondary | ICD-10-CM

## 2016-05-23 DIAGNOSIS — E538 Deficiency of other specified B group vitamins: Secondary | ICD-10-CM

## 2016-05-23 DIAGNOSIS — F5105 Insomnia due to other mental disorder: Secondary | ICD-10-CM

## 2016-05-23 DIAGNOSIS — F3341 Major depressive disorder, recurrent, in partial remission: Secondary | ICD-10-CM

## 2016-05-23 DIAGNOSIS — T148XXA Other injury of unspecified body region, initial encounter: Secondary | ICD-10-CM

## 2016-05-23 DIAGNOSIS — F411 Generalized anxiety disorder: Secondary | ICD-10-CM

## 2016-05-23 LAB — CBC WITH DIFFERENTIAL/PLATELET
Basophils Absolute: 0 10*3/uL (ref 0.0–0.1)
Basophils Relative: 0.5 % (ref 0.0–3.0)
Eosinophils Absolute: 0.1 10*3/uL (ref 0.0–0.7)
Eosinophils Relative: 1.4 % (ref 0.0–5.0)
HCT: 40.2 % (ref 36.0–46.0)
Hemoglobin: 13.7 g/dL (ref 12.0–15.0)
Lymphocytes Relative: 26.9 % (ref 12.0–46.0)
Lymphs Abs: 1.6 10*3/uL (ref 0.7–4.0)
MCHC: 34.2 g/dL (ref 30.0–36.0)
MCV: 93.6 fl (ref 78.0–100.0)
Monocytes Absolute: 0.3 10*3/uL (ref 0.1–1.0)
Monocytes Relative: 5.5 % (ref 3.0–12.0)
Neutro Abs: 3.9 10*3/uL (ref 1.4–7.7)
Neutrophils Relative %: 65.7 % (ref 43.0–77.0)
Platelets: 261 10*3/uL (ref 150.0–400.0)
RBC: 4.29 Mil/uL (ref 3.87–5.11)
RDW: 13.6 % (ref 11.5–15.5)
WBC: 6 10*3/uL (ref 4.0–10.5)

## 2016-05-23 LAB — COMPREHENSIVE METABOLIC PANEL
ALT: 17 U/L (ref 0–35)
AST: 18 U/L (ref 0–37)
Albumin: 4.4 g/dL (ref 3.5–5.2)
Alkaline Phosphatase: 79 U/L (ref 39–117)
BUN: 13 mg/dL (ref 6–23)
CO2: 26 mEq/L (ref 19–32)
Calcium: 9.8 mg/dL (ref 8.4–10.5)
Chloride: 106 mEq/L (ref 96–112)
Creatinine, Ser: 0.85 mg/dL (ref 0.40–1.20)
GFR: 74.71 mL/min (ref >60.00)
Glucose, Bld: 158 mg/dL — ABNORMAL HIGH (ref 70–99)
Potassium: 4.2 mEq/L (ref 3.5–5.1)
Sodium: 141 mEq/L (ref 135–145)
Total Bilirubin: 0.6 mg/dL (ref 0.2–1.2)
Total Protein: 7.5 g/dL (ref 6.0–8.3)

## 2016-05-23 LAB — TSH: TSH: 2.11 u[IU]/mL (ref 0.35–4.50)

## 2016-05-23 LAB — LDL CHOLESTEROL, DIRECT: Direct LDL: 131 mg/dL

## 2016-05-23 LAB — VITAMIN B12: Vitamin B-12: 203 pg/mL — ABNORMAL LOW (ref 211–911)

## 2016-05-23 LAB — VITAMIN D 25 HYDROXY (VIT D DEFICIENCY, FRACTURES): VITD: 19.6 ng/mL — ABNORMAL LOW (ref 30.00–100.00)

## 2016-05-23 MED ORDER — METOPROLOL SUCCINATE ER 25 MG PO TB24: 25.0000 mg | ORAL_TABLET | Freq: Every day | ORAL | 3 refills | Status: DC

## 2016-05-23 MED ORDER — ISOSORBIDE MONONITRATE ER 30 MG PO TB24: 30.0000 mg | ORAL_TABLET | Freq: Every day | ORAL | 3 refills | Status: DC

## 2016-05-23 MED ORDER — CITALOPRAM HYDROBROMIDE 20 MG PO TABS: 20.0000 mg | ORAL_TABLET | Freq: Every day | ORAL | 0 refills | Status: DC

## 2016-05-23 MED ORDER — BUPROPION HCL ER (SR) 150 MG PO TB12: 150.0000 mg | ORAL_TABLET | Freq: Two times a day (BID) | ORAL | 0 refills | Status: DC

## 2016-05-23 NOTE — Progress Notes (Signed)
Pre-visit discussion using our clinic review tool. No additional management support is needed unless otherwise documented below in the visit note.  

## 2016-05-23 NOTE — Patient Instructions (Addendum)
You have a large hematoma on your right thigh from the blunt trauma.  Let's give it another two weeks,  If it has not shrunk considerably we will have you see an orthopedic surgeon  Continue using ibuprofen and applying heat and ice  I have refilled all of your medications  Return in 6 months    Hematoma A hematoma is a collection of blood. The collection of blood can turn into a hard, painful lump under the skin. Your skin may turn blue or yellow if the hematoma is close to the surface of the skin. Most hematomas get better in a few days to weeks. Some hematomas are serious and need medical care. Hematomas can be very small or very big. Follow these instructions at home:  Apply ice to the injured area:  Put ice in a plastic bag.  Place a towel between your skin and the bag.  Leave the ice on for 20 minutes, 2-3 times a day for the first 1 to 2 days.  After the first 2 days, switch to using warm packs on the injured area.  Raise (elevate) the injured area to lessen pain and puffiness (swelling). You may also wrap the area with an elastic bandage. Make sure the bandage is not wrapped too tight.  If you have a painful hematoma on your leg or foot, you may use crutches for a couple days.  Only take medicines as told by your doctor. Get help right away if:  Your pain gets worse.  Your pain is not controlled with medicine.  You have a fever.  Your puffiness gets worse.  Your skin turns more blue or yellow.  Your skin over the hematoma breaks or starts bleeding.  Your hematoma is in your chest or belly (abdomen) and you are short of breath, feel weak, or have a change in consciousness.  Your hematoma is on your scalp and you have a headache that gets worse or a change in alertness or consciousness. This information is not intended to replace advice given to you by your health care provider. Make sure you discuss any questions you have with your health care provider. Document  Released: 07/13/2004 Document Revised: 11/11/2015 Document Reviewed: 11/13/2012 Elsevier Interactive Patient Education  2017 [employer].

## 2016-05-23 NOTE — Progress Notes (Signed)
Subjective:  Patient ID: Melanie Fisher, female    DOB: Aug 13, 1965  Age: 50 y.o. MRN: [id number]  CC: The primary encounter diagnosis was Pure hypercholesterolemia. Diagnoses of Encounter for immunization, Vitamin D deficiency, B12 deficiency, Fatigue, unspecified type, Hematoma and contusion, Essential hypertension, Generalized anxiety disorder, Recurrent major depressive disorder, in partial remission ([place]), Insomnia due to anxiety and fear, and Tobacco abuse counseling were also pertinent to this visit.  HPI Melanie Fisher presents for follow up on hypertension, depression, and ER/Urgent Care  follow up.  She has been lost to follow up due to loss of health insurance when she quit her job at [employer].   Cc:  Thigh lump>  She had a recent fall with contusion to right lateral thigh after falling through  A rotting board on her back her porch .  The board suddenly gave way and she fell through. Skin was not broken but her right lateral thigh became very bruised and swollen .  Went to urgent Care , xray of femur  was normal.  Had a second evaluation at a different Urgent Care a week later.  Due to  persistent swelling,  No further workup done.  Has been applying heat and ice  Swelling and pain has improved.  Bruising resolved.  Only painful when she tries to cross legs while asleep on left side or when her dogs lie on it.      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  Feels less stress at this job,  Loves the contact with children  .  Still taking wellbutrin and alprazolam  ,  No changes  Husband lost his job in October. Some anxiety associated with the financial strain.     Not taking vit d and b12   Still smoking.  Has tried gradual reduction of  quantity smoked per day.  Taking wellbutrin.  Smoking cessation instruction/counseling given: commended patient for reducing daily use. And encouraged  Patient to continue reduction in daily use by 1 cigarette every week  Outpatient Medications Prior to Visit  Medication Sig Dispense Refill  . ALPRAZolam (XANAX) 0.5 MG tablet Take 1 tablet (0.5 mg total) by mouth at bedtime as needed for anxiety. 30 tablet 3  . nitroGLYCERIN (NITROSTAT) 0.4 MG SL tablet Place 1 tablet (0.4 mg total) under the tongue every 5 (five) minutes as needed for chest pain. 25 tablet 3  . buPROPion (WELLBUTRIN SR) 150 MG 12 hr tablet Take 1 tablet (150 mg total) by mouth 2 (two) times daily. 180 tablet 0  . citalopram (CELEXA) 20 MG tablet Take 1 tablet (20 mg total) by mouth daily. 90 tablet 0  . isosorbide mononitrate (IMDUR) 30 MG 24 hr tablet Take 1 tablet (30 mg total) by mouth daily. 90 tablet 3  . metoprolol succinate (TOPROL-XL) 25 MG 24 hr tablet Take 1 tablet (25 mg total) by mouth daily. At bedtime for blood pressure/headache prevention 90 tablet 3  . zolpidem (AMBIEN CR) 12.5 MG CR tablet Take 1 tablet (12.5 mg total) by mouth at bedtime as needed. for sleep 30 tablet 3  . albuterol (PROVENTIL HFA;VENTOLIN HFA) 108 (90 BASE) MCG/ACT inhaler Inhale 2 puffs into the lungs every 6 (six) hours as needed for wheezing  or shortness of breath. 1 Inhaler 5  . fluconazole (DIFLUCAN) 150 MG tablet Take 1 tablet (150 mg total) by mouth once. May repeat in 2-3 days prn symptoms. 2 tablet 0  . gabapentin (NEURONTIN) 300 MG capsule Take 1 capsule (300 mg total) by mouth 3 (three) times daily. 90 capsule 1  . hydrOXYzine (ATARAX/VISTARIL) 25 MG tablet Take 1 tablet (25 mg total) by mouth every 8 (eight) hours as needed. 90 tablet 1  . mometasone-formoterol (DULERA) 100-5 MCG/ACT AERO Inhale 2 puffs into the lungs 2 (two) times daily. 39 g 5  . omeprazole (PRILOSEC) 40 MG capsule TAKE 1 CAPSULE (40 MG TOTAL) BY MOUTH DAILY. 90  capsule 3  . traMADol (ULTRAM) 50 MG tablet Take 1 tablet (50 mg total) by mouth every 6 (six) hours as needed. 90 tablet 3  . valACYclovir (VALTREX) 500 MG tablet Take 1 tablet (500 mg total) by mouth 3 (three) times daily. 21 tablet 3  . varenicline (CHANTIX STARTING MONTH PAK) 0.5 MG X 11 & 1 MG X 42 tablet Take one 0.5 mg tablet by mouth once daily for 3 days, then increase to one 0.5 mg tablet twice daily for 4 days, then increase to one 1 mg tablet twice daily. 53 tablet 0  . Vitamin D, Ergocalciferol, (DRISDOL) 50000 UNITS CAPS capsule Take 1 capsule (50,000 Units total) by mouth every 7 (seven) days. 8 capsule 0   No facility-administered medications prior to visit.     Review of Systems;  Patient denies headache, fevers, malaise, unintentional weight loss, skin rash, eye pain, sinus congestion and sinus pain, sore throat, dysphagia,  hemoptysis , cough, dyspnea, wheezing, chest pain, palpitations, orthopnea, edema, abdominal pain, nausea, melena, diarrhea, constipation, flank pain, dysuria, hematuria, urinary  Frequency, nocturia, numbness, tingling, seizures,  Focal weakness, Loss of consciousness,  Tremor, insomnia, depression, anxiety, and suicidal ideation.      Objective:  BP 138/88   Pulse (!) 106   Temp 98.3 F (36.8 C) (Oral)   Resp 12   Ht 5\' 3"  (1.6 m)   Wt 192 lb 12 oz (87.4 kg)   SpO2 97%   BMI 34.14 kg/m   BP Readings from Last 3 Encounters:  05/23/16 138/88  04/20/15 128/82  04/07/15 (!) 158/80    Wt Readings from Last 3 Encounters:  05/23/16 192 lb 12 oz (87.4 kg)  04/20/15 183 lb (83 kg)  04/07/15 183 lb 6 oz (83.2 kg)    General appearance: alert, cooperative and appears stated age Ears: normal TM's and external ear canals both ears Throat: lips, mucosa, and tongue normal; teeth and gums normal Neck: no adenopathy, no carotid bruit, supple, symmetrical, trachea midline and thyroid not enlarged, symmetric, no tenderness/mass/nodules Back: symmetric,  no curvature. ROM normal. No CVA tenderness. Lungs: clear to auscultation bilaterally Heart: regular rate and rhythm, S1, S2 normal, no murmur, click, rub or gallop Abdomen: soft, non-tender; bowel sounds normal; no masses,  no organomegaly Pulses: 2+ and symmetric Skin: Skin color, texture, turgor normal. No rashes or lesions Lymph nodes: Cervical, supraclavicular, and axillary nodes normal.  Lab Results  Component Value Date   HGBA1C 5.4 04/09/2015   HGBA1C 5.7 05/07/2012    Lab Results  Component Value Date   CREATININE 0.85 05/23/2016   CREATININE 0.72 04/09/2015   CREATININE 0.82 08/18/2014    Lab Results  Component Value Date   WBC 6.0 05/23/2016   HGB 13.7 05/23/2016   HCT 40.2 05/23/2016   PLT 261.0 05/23/2016  GLUCOSE 158 (H) 05/23/2016   CHOL 168 04/09/2015   TRIG 64 04/09/2015   HDL 62 04/09/2015   LDLDIRECT 131.0 05/23/2016   LDLCALC 93 04/09/2015   ALT 17 05/23/2016   AST 18 05/23/2016   NA 141 05/23/2016   K 4.2 05/23/2016   CL 106 05/23/2016   CREATININE 0.85 05/23/2016   BUN 13 05/23/2016   CO2 26 05/23/2016   TSH 2.11 05/23/2016   HGBA1C 5.4 04/09/2015    No results found.  Assessment & Plan:   Problem List Items Addressed This Visit    Depression    Managed with wellbutrin.  No changes today       Relevant Medications   buPROPion (WELLBUTRIN SR) 150 MG 12 hr tablet   citalopram (CELEXA) 20 MG tablet   Essential hypertension    Will resume previous regimen. Lytes and cr normal.   Lab Results  Component Value Date   CREATININE 0.85 05/23/2016   Lab Results  Component Value Date   NA 141 05/23/2016   K 4.2 05/23/2016   CL 106 05/23/2016   CO2 26 05/23/2016   No results found for: MICROALBUR, [id number]       Relevant Medications   isosorbide mononitrate (IMDUR) 30 MG 24 hr tablet   isosorbide mononitrate (IMDUR) 30 MG 24 hr tablet   metoprolol succinate (TOPROL-XL) 25 MG 24 hr tablet   Generalized anxiety disorder     Managed with celexa and  alprazolam prn.       Hematoma and contusion    Traumatic, right lateral thigh,  From fall through porch floorboard.  Baseball sized. The contusion is two weeks old and gradually diminishing per patient .  Will reassess in 2 weeks and offer Orthoepdics consult for surgical removal if not significantly better.  Continue ice/heat       Hyperlipidemia - Primary    nonfasting direct ldl is 131 today.  Last year's fasting panel did not indicate need for statin therapy    Lab Results  Component Value Date   CHOL 168 04/09/2015   HDL 62 04/09/2015   LDLCALC 93 04/09/2015   LDLDIRECT 131.0 05/23/2016   TRIG 64 04/09/2015   CHOLHDL 2.7 04/09/2015         Relevant Medications   isosorbide mononitrate (IMDUR) 30 MG 24 hr tablet   isosorbide mononitrate (IMDUR) 30 MG 24 hr tablet   metoprolol succinate (TOPROL-XL) 25 MG 24 hr tablet   Other Relevant Orders   LDL cholesterol, direct (Completed)   Insomnia due to anxiety and fear    Managed with ambien cr in the past.  Encouraged to avoid regular use.       Tobacco abuse counseling    Spent 3 minutes discussing risk of continued tobacco abuse, including but not limited to CAD, PAD, hypertension, and CA.  sHe is not interested in pharmacotherapy at this time.       Other Visit Diagnoses    Encounter for immunization       Relevant Orders   Flu Vaccine QUAD 36+ mos IM (Completed)   Vitamin D deficiency       Relevant Orders   VITAMIN D 25 Hydroxy (Vit-D Deficiency, Fractures) (Completed)   B12 deficiency       Relevant Orders   Vitamin B12 (Completed)   Methylmalonic Acid   Fatigue, unspecified type       Relevant Orders   Comprehensive metabolic panel (Completed)   TSH (Completed)   CBC with Differential/Platelet (  Completed)      I have discontinued Melanie Fisher albuterol, mometasone-formoterol, zolpidem, varenicline, valACYclovir, Vitamin D (Ergocalciferol), gabapentin, hydrOXYzine, traMADol,  fluconazole, and omeprazole. I am also having her maintain her nitroGLYCERIN, ALPRAZolam, isosorbide mononitrate, isosorbide mononitrate, buPROPion, citalopram, and metoprolol succinate.  Meds ordered this encounter  Medications  . DISCONTD: metoprolol succinate (TOPROL-XL) 25 MG 24 hr tablet    Sig: Take 1 tablet (25 mg total) by mouth daily. At bedtime for blood pressure/headache prevention    Dispense:  90 tablet    Refill:  3  . isosorbide mononitrate (IMDUR) 30 MG 24 hr tablet    Sig: Take 1 tablet (30 mg total) by mouth daily.    Dispense:  90 tablet    Refill:  3  . DISCONTD: metoprolol succinate (TOPROL-XL) 25 MG 24 hr tablet    Sig: Take 1 tablet (25 mg total) by mouth daily. At bedtime for blood pressure/headache prevention    Dispense:  90 tablet    Refill:  3  . isosorbide mononitrate (IMDUR) 30 MG 24 hr tablet    Sig: Take 1 tablet (30 mg total) by mouth daily.    Dispense:  90 tablet    Refill:  3  . buPROPion (WELLBUTRIN SR) 150 MG 12 hr tablet    Sig: Take 1 tablet (150 mg total) by mouth 2 (two) times daily.    Dispense:  180 tablet    Refill:  0    Patient needs office Visit for further refills.  . citalopram (CELEXA) 20 MG tablet    Sig: Take 1 tablet (20 mg total) by mouth daily.    Dispense:  90 tablet    Refill:  0    Patient needs Office Visit for further refills.  . metoprolol succinate (TOPROL-XL) 25 MG 24 hr tablet    Sig: Take 1 tablet (25 mg total) by mouth daily. At bedtime for blood pressure/headache prevention    Dispense:  90 tablet    Refill:  3    Medications Discontinued During This Encounter  Medication Reason  . albuterol (PROVENTIL HFA;VENTOLIN HFA) 108 (90 BASE) MCG/ACT inhaler Patient Preference  . Vitamin D, Ergocalciferol, (DRISDOL) 50000 UNITS CAPS capsule Completed Course  . varenicline (CHANTIX STARTING MONTH PAK) 0.5 MG X 11 & 1 MG X 42 tablet Completed Course  . traMADol (ULTRAM) 50 MG tablet Completed Course  . valACYclovir  (VALTREX) 500 MG tablet Completed Course  . omeprazole (PRILOSEC) 40 MG capsule Completed Course  . mometasone-formoterol (DULERA) 100-5 MCG/ACT AERO Completed Course  . hydrOXYzine (ATARAX/VISTARIL) 25 MG tablet Completed Course  . gabapentin (NEURONTIN) 300 MG capsule Completed Course  . fluconazole (DIFLUCAN) 150 MG tablet Completed Course  . metoprolol succinate (TOPROL-XL) 25 MG 24 hr tablet Reorder  . isosorbide mononitrate (IMDUR) 30 MG 24 hr tablet Reorder  . metoprolol succinate (TOPROL-XL) 25 MG 24 hr tablet   . buPROPion (WELLBUTRIN SR) 150 MG 12 hr tablet Reorder  . citalopram (CELEXA) 20 MG tablet Reorder  . zolpidem (AMBIEN CR) 12.5 MG CR tablet   . metoprolol succinate (TOPROL-XL) 25 MG 24 hr tablet Reorder    Follow-up: No Follow-up on file.   Crecencio Mc, MD

## 2016-05-24 ENCOUNTER — Encounter: Payer: Self-pay | Admitting: Internal Medicine

## 2016-05-24 ENCOUNTER — Telehealth: Payer: Self-pay | Admitting: *Deleted

## 2016-05-24 DIAGNOSIS — T148XXA Other injury of unspecified body region, initial encounter: Secondary | ICD-10-CM | POA: Insufficient documentation

## 2016-05-24 DIAGNOSIS — R0789 Other chest pain: Secondary | ICD-10-CM

## 2016-05-24 MED ORDER — OMEPRAZOLE 40 MG PO CPDR: 40.0000 mg | DELAYED_RELEASE_CAPSULE | Freq: Every day | ORAL | 3 refills | Status: DC

## 2016-05-24 NOTE — Assessment & Plan Note (Signed)
Spent 3 minutes discussing risk of continued tobacco abuse, including but not limited to CAD, PAD, hypertension, and CA.  sHe is not interested in pharmacotherapy at this time. 

## 2016-05-24 NOTE — Assessment & Plan Note (Addendum)
Managed with celexa and  alprazolam prn.

## 2016-05-24 NOTE — Telephone Encounter (Signed)
Refill for omeprazole sent

## 2016-05-24 NOTE — Telephone Encounter (Signed)
Patient requested to know if a acid reflux medication was sent to the pharmacy  Pt contact [phone]

## 2016-05-24 NOTE — Assessment & Plan Note (Signed)
Will resume previous regimen. Lytes and cr normal.   Lab Results  Component Value Date   CREATININE 0.85 05/23/2016   Lab Results  Component Value Date   NA 141 05/23/2016   K 4.2 05/23/2016   CL 106 05/23/2016   CO2 26 05/23/2016   No results found for: Derl Barrow

## 2016-05-24 NOTE — Assessment & Plan Note (Signed)
nonfasting direct ldl is 131 today.  Last year's fasting panel did not indicate need for statin therapy    Lab Results  Component Value Date   CHOL 168 04/09/2015   HDL 62 04/09/2015   LDLCALC 93 04/09/2015   LDLDIRECT 131.0 05/23/2016   TRIG 64 04/09/2015   CHOLHDL 2.7 04/09/2015

## 2016-05-24 NOTE — Assessment & Plan Note (Signed)
Traumatic, right lateral thigh,  From fall through porch floorboard.  Baseball sized. The contusion is two weeks old and gradually diminishing per patient .  Will reassess in 2 weeks and offer Orthoepdics consult for surgical removal if not significantly better.  Continue ice/heat

## 2016-05-24 NOTE — Assessment & Plan Note (Signed)
Managed with ambien cr in the past.  Encouraged to avoid regular use.

## 2016-05-24 NOTE — Assessment & Plan Note (Signed)
Managed with wellbutrin. No changes today  

## 2016-05-25 ENCOUNTER — Telehealth: Payer: Self-pay | Admitting: *Deleted

## 2016-05-25 ENCOUNTER — Other Ambulatory Visit: Payer: Self-pay | Admitting: Internal Medicine

## 2016-05-25 ENCOUNTER — Encounter: Payer: Self-pay | Admitting: Internal Medicine

## 2016-05-25 DIAGNOSIS — E538 Deficiency of other specified B group vitamins: Secondary | ICD-10-CM

## 2016-05-25 DIAGNOSIS — R7301 Impaired fasting glucose: Secondary | ICD-10-CM

## 2016-05-25 NOTE — Telephone Encounter (Signed)
Voicemail: Patient requested lab results  Pt contact [phone]

## 2016-05-25 NOTE — Telephone Encounter (Signed)
She is B deficient and diabetic,  Both new.  Needs RN visit on Monday and lab visit non fasting,  My chart message sent.

## 2016-05-25 NOTE — Telephone Encounter (Signed)
I see that some of the labs have resulted, please advise, thanks

## 2016-05-26 LAB — METHYLMALONIC ACID, SERUM: Methylmalonic Acid, Quant: 131 nmol/L (ref 87–318)

## 2016-05-26 NOTE — Telephone Encounter (Signed)
Spoke with the patient, she had not read the Mychart message, I reviewed lab results and then scheduled for B12 and labs on Monday,.

## 2016-05-27 ENCOUNTER — Other Ambulatory Visit: Payer: Self-pay | Admitting: Internal Medicine

## 2016-05-27 MED ORDER — CYANOCOBALAMIN 1000 MCG/ML IJ SOLN: 1000.0000 ug | INTRAMUSCULAR | 0 refills | Status: DC

## 2016-05-27 MED ORDER — ERGOCALCIFEROL 1.25 MG (50000 UT) PO CAPS: 50000.0000 [IU] | ORAL_CAPSULE | ORAL | 3 refills | Status: DC

## 2016-05-29 ENCOUNTER — Other Ambulatory Visit (INDEPENDENT_AMBULATORY_CARE_PROVIDER_SITE_OTHER): Payer: BC Managed Care – PPO

## 2016-05-29 ENCOUNTER — Ambulatory Visit (INDEPENDENT_AMBULATORY_CARE_PROVIDER_SITE_OTHER): Payer: BC Managed Care – PPO

## 2016-05-29 DIAGNOSIS — E538 Deficiency of other specified B group vitamins: Secondary | ICD-10-CM | POA: Diagnosis not present

## 2016-05-29 DIAGNOSIS — R7301 Impaired fasting glucose: Secondary | ICD-10-CM

## 2016-05-29 LAB — HEMOGLOBIN A1C: Hgb A1c MFr Bld: 5.4 % (ref 4.6–6.5)

## 2016-05-29 MED ORDER — CYANOCOBALAMIN 1000 MCG/ML IJ SOLN
1000.0000 ug | Freq: Once | INTRAMUSCULAR | Status: AC
  Administered 2016-05-29: 1000 ug via INTRAMUSCULAR

## 2016-05-29 NOTE — Progress Notes (Addendum)
Patient comes in for first weekly B 12 injection.  Injected left deltoid.  Patient tolerated injection well.     I have reviewed the above information and agree with above.   Deborra Medina, MD

## 2016-05-30 LAB — FOLATE RBC: RBC Folate: 567 ng/mL (ref >280)

## 2016-06-01 ENCOUNTER — Encounter: Payer: Self-pay | Admitting: Internal Medicine

## 2016-06-05 ENCOUNTER — Ambulatory Visit (INDEPENDENT_AMBULATORY_CARE_PROVIDER_SITE_OTHER): Payer: BC Managed Care – PPO

## 2016-06-05 ENCOUNTER — Telehealth: Payer: Self-pay

## 2016-06-05 DIAGNOSIS — E538 Deficiency of other specified B group vitamins: Secondary | ICD-10-CM

## 2016-06-05 DIAGNOSIS — S7011XD Contusion of right thigh, subsequent encounter: Secondary | ICD-10-CM

## 2016-06-05 MED ORDER — CYANOCOBALAMIN 1000 MCG/ML IJ SOLN
1000.0000 ug | Freq: Once | INTRAMUSCULAR | Status: AC
  Administered 2016-06-05: 1000 ug via INTRAMUSCULAR

## 2016-06-05 NOTE — Telephone Encounter (Signed)
Referral made. MyChart message sent

## 2016-06-05 NOTE — Progress Notes (Signed)
Patient comes in for B 12 injection.  Injected right arm. Patient tolerated injection well.

## 2016-06-05 NOTE — Telephone Encounter (Signed)
Patient states right thigh , swelling has came back, tender to touch .  Would like referral to orthopedics. Please advise.

## 2016-06-05 NOTE — Telephone Encounter (Signed)
Patient advised.

## 2016-06-07 NOTE — Progress Notes (Signed)
  I have reviewed the above information and agree with above.   Cecilia Vancleve, MD 

## 2016-06-08 NOTE — Telephone Encounter (Signed)
Mailed unread message to patient.  

## 2016-06-13 ENCOUNTER — Ambulatory Visit (INDEPENDENT_AMBULATORY_CARE_PROVIDER_SITE_OTHER): Payer: BC Managed Care – PPO

## 2016-06-13 DIAGNOSIS — E538 Deficiency of other specified B group vitamins: Secondary | ICD-10-CM | POA: Diagnosis not present

## 2016-06-13 MED ORDER — CYANOCOBALAMIN 1000 MCG/ML IJ SOLN: 1000.0000 ug | Freq: Once | INTRAMUSCULAR | Status: DC

## 2016-06-13 MED ORDER — CYANOCOBALAMIN 1000 MCG/ML IJ SOLN
1000.0000 ug | Freq: Once | INTRAMUSCULAR | Status: AC
  Administered 2016-06-13 (×2): 1000 ug via INTRAMUSCULAR

## 2016-06-13 NOTE — Progress Notes (Signed)
Patient comes in for B 12 injection.  Injected left deltoid. She will follow up with monthly B 12 injections.    Patient tolerated injected well.

## 2016-06-15 NOTE — Telephone Encounter (Signed)
Mailed unread message to patient.  

## 2016-06-16 NOTE — Progress Notes (Signed)
I have reviewed the above note and agree.  Canon Gola, M.D.  

## 2016-06-20 NOTE — Telephone Encounter (Signed)
Mailed unread message to patient.  

## 2016-07-18 ENCOUNTER — Ambulatory Visit: Payer: Self-pay

## 2016-08-21 ENCOUNTER — Ambulatory Visit (INDEPENDENT_AMBULATORY_CARE_PROVIDER_SITE_OTHER): Payer: Self-pay | Admitting: Orthopaedic Surgery

## 2016-08-21 ENCOUNTER — Encounter (INDEPENDENT_AMBULATORY_CARE_PROVIDER_SITE_OTHER): Payer: Self-pay | Admitting: Orthopaedic Surgery

## 2016-08-21 ENCOUNTER — Ambulatory Visit (INDEPENDENT_AMBULATORY_CARE_PROVIDER_SITE_OTHER): Payer: Worker's Compensation | Admitting: Orthopaedic Surgery

## 2016-08-21 VITALS — BP 160/96 | HR 98 | Ht 63.0 in | Wt 185.0 lb

## 2016-08-21 DIAGNOSIS — G8929 Other chronic pain: Secondary | ICD-10-CM

## 2016-08-21 DIAGNOSIS — M25561 Pain in right knee: Secondary | ICD-10-CM

## 2016-08-21 MED ORDER — BUPIVACAINE HCL 0.5 % IJ SOLN
3.0000 mL | INTRAMUSCULAR | Status: AC | PRN
  Administered 2016-08-21: 3 mL via INTRA_ARTICULAR

## 2016-08-21 MED ORDER — LIDOCAINE HCL 1 % IJ SOLN
5.0000 mL | INTRAMUSCULAR | Status: AC | PRN
  Administered 2016-08-21: 5 mL

## 2016-08-21 MED ORDER — METHYLPREDNISOLONE ACETATE 40 MG/ML IJ SUSP
80.0000 mg | INTRAMUSCULAR | Status: AC | PRN
  Administered 2016-08-21: 80 mg

## 2016-08-21 NOTE — Progress Notes (Signed)
Office Visit Note   Patient: Melanie Fisher           Date of Birth: 1965/10/13           MRN: [id number] Visit Date: 08/21/2016              Requested by: Crecencio Mc, MD [address] [place], [place] [address] PCP: Crecencio Mc, MD   Assessment & Plan: Visit Diagnoses: Chondromalacia patella right knee Plan: Cortisone injection today, follow-up in 2 weeks to initiate Euflexxa, smoking sensation, exercise program, return to work next Monday half days for 2 weeks. Office visit nearly 1 hour in discussing all of the records and suggested treatment.  Follow-Up Instructions: No Follow-up on file.   Orders:  No orders of the defined types were placed in this encounter.  No orders of the defined types were placed in this encounter.     Procedures: Large Joint Inj Date/Time: 08/21/2016 9:51 AM Performed by: Garald Balding Authorized by: Garald Balding   Consent Given by:  Patient Timeout: prior to procedure the correct patient, procedure, and site was verified   Indications:  Pain and joint swelling Location:  Knee Site:  R knee Prep: patient was prepped and draped in usual sterile fashion   Needle Size:  25 G Needle Length:  1.5 inches Approach:  Anteromedial Ultrasound Guidance: No   Fluoroscopic Guidance: No   Arthrogram: No   Medications:  5 mL lidocaine 1 %; 80 mg methylPREDNISolone acetate 40 MG/ML; 3 mL bupivacaine 0.5 % Aspiration Attempted: No   Patient tolerance:  Patient tolerated the procedure well with no immediate complications     Clinical Data: No additional findings. MRI scan performed 06/05/2016 demonstrates both medial and lateral menisci to be intact. Ligaments are intact. Cartilage loss is present at the patellofemoral joint at the apex of the patella with extensive underlying subchondral cyst formation mild arthritic changes in the medial and lateral compartment. No joint effusion, tiny Baker's cyst  Subjective: No  chief complaint on file.   Melanie Fisher is here today for Right knee pain that happen at her work, where she fell over a fan.Xrays obtained , disc supplied. Her accident happen 02/25/16. Pt relates she has burning pain that travels down to side of leg.  [name] sustained an on-the-job injury in September 2017 while working for the [employer] in [employer]. She notes that she tripped over a fan landing on her right knee. She has not worked since. She hurt her right elbow as well but it is "better". She had difficulty walking after her injury and dragging her foot and having difficulty flexing and extending her knee. She's developed burning in her right knee distally to the dorsum of her foot. She had no trouble with her knee prior to the injury. Presently she is having difficulty getting up and down and putting any pressure on her knees. She has difficulty with steps and with bending and stooping. Through  her primary care physician she's had 3 courses of physical therapy, shot of cortisone and gabapentin for the burning pain. Prior to her visit I evaluated her records from her primary care physician and I've also discussed these withJoyce Kivett with [employer].  Review of Systems   Objective: Vital Signs: There were no vitals taken for this visit.  Physical Exam  Ortho Exam right knee exam without evidence of effusion. Full extension and flexed over 100. No  instability with a varus or valgus stress. Mild medial and lateral joint pain. More patellar discomfort but little if any crepitation. No calf pain.  Neurovascular exam intact distally. No popliteal pain. No thigh pain or pain with range of motion of her right hip. Straight leg raise negative.  Specialty Comments:  No specialty comments available.  Imaging: No results found.   PMFS History: Patient Active Problem List   Diagnosis Date Noted  . Hematoma and contusion 05/24/2016  .  Insomnia due to anxiety and fear 04/10/2015  . Polyneuropathy ([place]) 02/10/2015  . Cervical disc disorder with radiculopathy of cervical region 12/25/2014  . Essential hypertension 08/19/2014  . Slipped rib syndrome 03/23/2014  . Degenerative disc disease, thoracic 03/23/2014  . Nonallopathic lesion of thoracic region 03/23/2014  . Nonallopathic lesion-rib cage 03/23/2014  . Degenerative disk disease 02/12/2014  . Personal history of colonic polyps 02/11/2014  . Pain, pelvic, female 01/05/2014  . Headache above the eye region 11/18/2013  . Other chest pain 11/18/2013  . Obesity 07/01/2013  . Tobacco abuse counseling 03/15/2013  . Routine general medical examination at a health care facility 09/04/2012  . COPD (chronic obstructive pulmonary disease) ([place]) 05/26/2012  . Chronic shoulder pain 04/09/2012  . Generalized anxiety disorder 04/06/2012  . Tobacco abuse 04/06/2012  . Depression   . Heart murmur   . Hyperlipidemia   . Allergy    Past Medical History:  Diagnosis Date  . Allergy   . Anxiety   . Asthma   . Depression   . Heart murmur   . Hyperlipidemia   . Sleep apnea   . Tobacco abuse     Family History  Problem Relation Age of Onset  . Kidney disease Mother 34    ESKD, partial neprhectomy  . COPD Mother   . Heart disease Father     CABG at age 77  . Alcohol abuse Father     until late 56  . Diabetes Father   . Hypertension Father   . Diabetes Sister   . Cancer Sister     uterine ca  . Cancer Maternal Uncle 60    stage 4 colon CA  . Colon cancer Maternal Uncle   . Esophageal cancer Neg Hx   . Pancreatic cancer Neg Hx   . Rectal cancer Neg Hx   . Stomach cancer Neg Hx     Past Surgical History:  Procedure Laterality Date  . ABDOMINAL HYSTERECTOMY  2008   partial  . right toe bunionectomy    . right wrist ganglion cyst removal     Social History   Occupational History  . Not on file.   Social History Main Topics  . Smoking status: Current Every  Day Smoker    Packs/day: 1.50    Years: 21.00    Types: Cigarettes  . Smokeless tobacco: Not on file  . Alcohol use Yes     Comment: occasionally  . Drug use: No  . Sexual activity: Not Currently

## 2016-08-21 NOTE — Progress Notes (Deleted)
Office Visit Note   Patient: Melanie Fisher           Date of Birth: [birth year]           MRN: [id number] Visit Date: 08/21/2016              Requested by: Crecencio Mc, MD [address] [place], [place] [address] PCP: Crecencio Mc, MD   Assessment & Plan: Visit Diagnoses:  1. Chronic pain of right knee     Plan: ***  Follow-Up Instructions: Return in about 2 weeks (around 09/04/2016).   Orders:  No orders of the defined types were placed in this encounter.  No orders of the defined types were placed in this encounter.     Procedures: No procedures performed   Clinical Data: No additional findings.   Subjective: No chief complaint on file.   HPI  Review of Systems   Objective: Vital Signs: There were no vitals taken for this visit.  Physical Exam  Ortho Exam  Specialty Comments:  No specialty comments available.  Imaging: No results found.   PMFS History: Patient Active Problem List   Diagnosis Date Noted  . Hematoma and contusion 05/24/2016  . Insomnia due to anxiety and fear 04/10/2015  . Polyneuropathy ([place]) 02/10/2015  . Cervical disc disorder with radiculopathy of cervical region 12/25/2014  . Essential hypertension 08/19/2014  . Slipped rib syndrome 03/23/2014  . Degenerative disc disease, thoracic 03/23/2014  . Nonallopathic lesion of thoracic region 03/23/2014  . Nonallopathic lesion-rib cage 03/23/2014  . Degenerative disk disease 02/12/2014  . Personal history of colonic polyps 02/11/2014  . Pain, pelvic, female 01/05/2014  . Headache above the eye region 11/18/2013  . Other chest pain 11/18/2013  . Obesity 07/01/2013  . Tobacco abuse counseling 03/15/2013  . Routine general medical examination at a health care facility 09/04/2012  . COPD (chronic obstructive pulmonary disease) ([place]) 05/26/2012  . Chronic shoulder pain 04/09/2012  . Generalized anxiety disorder 04/06/2012  . Tobacco abuse 04/06/2012  .  Depression   . Heart murmur   . Hyperlipidemia   . Allergy    Past Medical History:  Diagnosis Date  . Allergy   . Anxiety   . Asthma   . Depression   . Heart murmur   . Hyperlipidemia   . Sleep apnea   . Tobacco abuse     Family History  Problem Relation Age of Onset  . Kidney disease Mother 87    ESKD, partial neprhectomy  . COPD Mother   . Heart disease Father     CABG at age 33  . Alcohol abuse Father     until late 22  . Diabetes Father   . Hypertension Father   . Diabetes Sister   . Cancer Sister     uterine ca  . Cancer Maternal Uncle 60    stage 4 colon CA  . Colon cancer Maternal Uncle   . Esophageal cancer Neg Hx   . Pancreatic cancer Neg Hx   . Rectal cancer Neg Hx   . Stomach cancer Neg Hx     Past Surgical History:  Procedure Laterality Date  . ABDOMINAL HYSTERECTOMY  2008   partial  . right toe bunionectomy    . right wrist ganglion cyst removal     Social History   Occupational History  . Not on file.   Social History Main Topics  . Smoking status: Current Every Day Smoker  Packs/day: 1.50    Years: 21.00    Types: Cigarettes  . Smokeless tobacco: Never Used  . Alcohol use Yes     Comment: occasionally  . Drug use: No  . Sexual activity: Not Currently

## 2016-08-22 NOTE — Progress Notes (Signed)
erroneus encounter

## 2016-08-30 ENCOUNTER — Telehealth (INDEPENDENT_AMBULATORY_CARE_PROVIDER_SITE_OTHER): Payer: Self-pay | Admitting: Orthopaedic Surgery

## 2016-08-30 NOTE — Telephone Encounter (Signed)
Campbell Stall (nurse case manager) called about the cost of Euflexxa injections for patient since they have to be approved through the workers [employer]. Please call her back at [phone] and ask for her to be paged. Thanks.

## 2016-09-04 ENCOUNTER — Encounter (INDEPENDENT_AMBULATORY_CARE_PROVIDER_SITE_OTHER): Payer: Self-pay | Admitting: Orthopaedic Surgery

## 2016-09-04 ENCOUNTER — Ambulatory Visit (INDEPENDENT_AMBULATORY_CARE_PROVIDER_SITE_OTHER): Payer: Worker's Compensation | Admitting: Orthopaedic Surgery

## 2016-09-04 VITALS — BP 163/98 | HR 109 | Resp 16 | Ht 63.0 in | Wt 168.0 lb

## 2016-09-04 DIAGNOSIS — M25561 Pain in right knee: Secondary | ICD-10-CM | POA: Diagnosis not present

## 2016-09-04 DIAGNOSIS — G8929 Other chronic pain: Secondary | ICD-10-CM

## 2016-09-04 NOTE — Progress Notes (Signed)
Office Visit Note   Patient: Melanie Fisher           Date of Birth: 1965-07-08           MRN: [id number] Visit Date: 09/04/2016              Requested by: Crecencio Mc, MD [address] [place], [place] [address] PCP: Crecencio Mc, MD   Assessment & Plan: Visit Diagnoses: Workman's comp injury right knee with MRI scan consistent with chondromalacia patella. Pain has improved with cortisone injection. Patient wishes to return to work full time full duty tomorrow without further treatment  Plan: Discussion regarding returning to work tomorrow  We will provide a note saying that she can work full time i.e. 6 hours a day without limitations. Plan to see her back in a when necessary basis. No permanent partial impairment  Follow-Up Instructions: No Follow-up on file.   Orders:  No orders of the defined types were placed in this encounter.  No orders of the defined types were placed in this encounter.     Procedures: No procedures performed   Clinical Data: No additional findings.   Subjective: No chief complaint on file.   Ms. Melanie Fisher is a 51 year old female presenting with right knee pain. The cortisone shot 2 weeks ago provided no relief. She is not interested in visco supplementation .   Mrs. Melanie Fisher is  accompanied by her husband and has determined that she would like to return to work full time, full duty without restrictions or limitations starting tomorrow. She is aware that she may have some issues with her knee with certain types of activities. 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 I reviewed those with her and she doesn't think that those activities twill be a problem Review of Systems   Objective: Vital Signs: There were no vitals taken for this visit.  Physical Exam  Ortho Exam right knee without effusion. Minimal patellar crepitation. Minimal medial lateral joint pain  without instability. Full extension and flexion over 105. No popliteal pain. No distal edema. Neurovascular exam intact.[name]  Specialty Comments:  No specialty comments available.  Imaging: No results found.   PMFS History: Patient Active Problem List   Diagnosis Date Noted  . Hematoma and contusion 05/24/2016  . Insomnia due to anxiety and fear 04/10/2015  . Polyneuropathy ([place]) 02/10/2015  . Cervical disc disorder with radiculopathy of cervical region 12/25/2014  . Essential hypertension 08/19/2014  . Slipped rib syndrome 03/23/2014  . Degenerative disc disease, thoracic 03/23/2014  . Nonallopathic lesion of thoracic region 03/23/2014  . Nonallopathic lesion-rib cage 03/23/2014  . Degenerative disk disease 02/12/2014  . Personal history of colonic polyps 02/11/2014  . Pain, pelvic, female 01/05/2014  . Headache above the eye region 11/18/2013  . Other chest pain 11/18/2013  . Obesity 07/01/2013  . Tobacco abuse counseling 03/15/2013  . Routine general medical examination at a health care facility 09/04/2012  . COPD (chronic obstructive pulmonary disease) ([place]) 05/26/2012  . Chronic shoulder pain 04/09/2012  . Generalized anxiety disorder 04/06/2012  . Tobacco abuse 04/06/2012  . Depression   . Heart murmur   . Hyperlipidemia   . Allergy    Past Medical History:  Diagnosis Date  . Allergy   . Anxiety   . Asthma   . Depression   . Heart murmur   . Hyperlipidemia   .  Sleep apnea   . Tobacco abuse     Family History  Problem Relation Age of Onset  . Kidney disease Mother 50    ESKD, partial neprhectomy  . COPD Mother   . Heart disease Father     CABG at age 6  . Alcohol abuse Father     until late 75  . Diabetes Father   . Hypertension Father   . Diabetes Sister   . Cancer Sister     uterine ca  . Cancer Maternal Uncle 60    stage 4 colon CA  . Colon cancer Maternal Uncle   . Esophageal cancer Neg Hx   . Pancreatic cancer Neg Hx   . Rectal cancer Neg  Hx   . Stomach cancer Neg Hx     Past Surgical History:  Procedure Laterality Date  . ABDOMINAL HYSTERECTOMY  2008   partial  . right toe bunionectomy    . right wrist ganglion cyst removal     Social History   Occupational History  . Not on file.   Social History Main Topics  . Smoking status: Current Every Day Smoker    Packs/day: 1.50    Years: 21.00    Types: Cigarettes  . Smokeless tobacco: Never Used  . Alcohol use Yes     Comment: occasionally  . Drug use: No  . Sexual activity: Not Currently

## 2016-10-25 NOTE — Telephone Encounter (Signed)
Left message with Campbell Stall, cost of Euflexxa inj. $555 each.

## 2016-11-21 ENCOUNTER — Ambulatory Visit: Payer: Self-pay | Admitting: Internal Medicine

## 2016-12-05 ENCOUNTER — Encounter: Payer: Self-pay | Admitting: Gastroenterology

## 2016-12-15 ENCOUNTER — Ambulatory Visit (INDEPENDENT_AMBULATORY_CARE_PROVIDER_SITE_OTHER): Payer: BC Managed Care – PPO | Admitting: Family Medicine

## 2016-12-15 ENCOUNTER — Encounter: Payer: Self-pay | Admitting: Family Medicine

## 2016-12-15 DIAGNOSIS — J441 Chronic obstructive pulmonary disease with (acute) exacerbation: Secondary | ICD-10-CM | POA: Diagnosis not present

## 2016-12-15 MED ORDER — HYDROCOD POLST-CPM POLST ER 10-8 MG/5ML PO SUER: 5.0000 mL | Freq: Two times a day (BID) | ORAL | 0 refills | Status: DC | PRN

## 2016-12-15 MED ORDER — PREDNISONE 50 MG PO TABS: ORAL_TABLET | ORAL | 0 refills | Status: DC

## 2016-12-15 NOTE — Progress Notes (Signed)
Subjective:  Patient ID: Melanie Fisher, female    DOB: Mar 08, 1966  Age: 51 y.o. MRN: [id number]  CC: Cough  HPI:  51 year old female with COPD presents with complaints of cough.  Patient reports that she was sick earlier this month. She had upper respiratory symptoms. She subsequently developed a cough. Cough has persisted and has been worse for the past week. Nonproductive. Severe and unrelenting. Mild shortness of breath. No medications or interventions tried. No other associated symptoms. Seems to be exacerbated by certain allergens. No relieving factors. No other complaints at this time.  Social Hx   Social History   Social History  . Marital status: Married    Spouse name: N/A  . Number of children: N/A  . Years of education: N/A   Social History Main Topics  . Smoking status: Current Every Day Smoker    Packs/day: 1.50    Years: 21.00    Types: Cigarettes  . Smokeless tobacco: Never Used  . Alcohol use Yes     Comment: occasionally  . Drug use: No  . Sexual activity: Not Currently   Other Topics Concern  . None   Social History Narrative  . None    Review of Systems  Constitutional: Negative for fever.  Respiratory: Positive for cough and shortness of breath.    Objective:  BP (!) 156/66   Pulse 63   Temp 98 F (36.7 C) (Oral)   Resp 16   Ht 5\' 3"  (1.6 m)   Wt 195 lb 12.8 oz (88.8 kg)   SpO2 96%   BMI 34.68 kg/m   BP/Weight 12/15/2016 0/93/2671 07/24/5807  Systolic BP 983 382 505  Diastolic BP 66 98 96  Wt. (Lbs) 195.8 168 185  BMI 34.68 29.76 32.77   Physical Exam  Constitutional: She is oriented to person, place, and time. She appears well-developed. No distress.  Cardiovascular: Normal rate and regular rhythm.   Murmur heard. Pulmonary/Chest: Effort normal. She has no wheezes. She has no rales.  Neurological: She is alert and oriented to person, place, and time.  Psychiatric: She has a normal mood and affect.  Vitals reviewed.   Lab  Results  Component Value Date   WBC 6.0 05/23/2016   HGB 13.7 05/23/2016   HCT 40.2 05/23/2016   PLT 261.0 05/23/2016   GLUCOSE 158 (H) 05/23/2016   CHOL 168 04/09/2015   TRIG 64 04/09/2015   HDL 62 04/09/2015   LDLDIRECT 131.0 05/23/2016   LDLCALC 93 04/09/2015   ALT 17 05/23/2016   AST 18 05/23/2016   NA 141 05/23/2016   K 4.2 05/23/2016   CL 106 05/23/2016   CREATININE 0.85 05/23/2016   BUN 13 05/23/2016   CO2 26 05/23/2016   TSH 2.11 05/23/2016   HGBA1C 5.4 05/29/2016    Assessment & Plan:   Problem List Items Addressed This Visit      Respiratory   COPD exacerbation ([place])    History consistent with mild COPD exacerbation. Treated with prednisone and tussionex. No indication for antibodiotics at this time.      Relevant Medications   predniSONE (DELTASONE) 50 MG tablet   chlorpheniramine-HYDROcodone (TUSSIONEX PENNKINETIC ER) 10-8 MG/5ML SUER      Meds ordered this encounter  Medications  . predniSONE (DELTASONE) 50 MG tablet    Sig: 1 tablet daily x 5 days.    Dispense:  5 tablet    Refill:  0  . chlorpheniramine-HYDROcodone (TUSSIONEX PENNKINETIC ER) 10-8 MG/5ML SUER  Sig: Take 5 mLs by mouth every 12 (twelve) hours as needed.    Dispense:  115 mL    Refill:  0   Follow-up: PRN  [place]

## 2016-12-15 NOTE — Assessment & Plan Note (Signed)
History consistent with mild COPD exacerbation. Treated with prednisone and tussionex. No indication for antibodiotics at this time.

## 2016-12-15 NOTE — Patient Instructions (Signed)
Prednisone and tussionex as prescribed.  Call with concerns.  Take care  Dr. Lacinda Axon

## 2016-12-26 ENCOUNTER — Ambulatory Visit: Payer: Self-pay | Admitting: Internal Medicine

## 2017-03-07 ENCOUNTER — Encounter: Payer: Self-pay | Admitting: Family

## 2017-03-07 ENCOUNTER — Ambulatory Visit (INDEPENDENT_AMBULATORY_CARE_PROVIDER_SITE_OTHER): Payer: BC Managed Care – PPO | Admitting: Family

## 2017-03-07 VITALS — BP 140/90 | HR 101 | Temp 98.5°F | Ht 63.0 in | Wt 183.6 lb

## 2017-03-07 DIAGNOSIS — J439 Emphysema, unspecified: Secondary | ICD-10-CM

## 2017-03-07 DIAGNOSIS — J0130 Acute sphenoidal sinusitis, unspecified: Secondary | ICD-10-CM | POA: Insufficient documentation

## 2017-03-07 DIAGNOSIS — I10 Essential (primary) hypertension: Secondary | ICD-10-CM | POA: Diagnosis not present

## 2017-03-07 DIAGNOSIS — J0110 Acute frontal sinusitis, unspecified: Secondary | ICD-10-CM

## 2017-03-07 DIAGNOSIS — J019 Acute sinusitis, unspecified: Secondary | ICD-10-CM | POA: Insufficient documentation

## 2017-03-07 MED ORDER — ALBUTEROL SULFATE HFA 108 (90 BASE) MCG/ACT IN AERS: 2.0000 | INHALATION_SPRAY | Freq: Four times a day (QID) | RESPIRATORY_TRACT | 1 refills | Status: DC | PRN

## 2017-03-07 MED ORDER — METOPROLOL SUCCINATE ER 25 MG PO TB24: 25.0000 mg | ORAL_TABLET | Freq: Every day | ORAL | 3 refills | Status: DC

## 2017-03-07 MED ORDER — AMOXICILLIN-POT CLAVULANATE 875-125 MG PO TABS: 1.0000 | ORAL_TABLET | Freq: Two times a day (BID) | ORAL | 0 refills | Status: DC

## 2017-03-07 NOTE — Assessment & Plan Note (Signed)
Elevated. Hasn't taken BP medications. No signs or symptoms of hypertensive urgency or emergency at this time. Advised to take medications as soon as gets home and to call us with reading.

## 2017-03-07 NOTE — Assessment & Plan Note (Signed)
Afebrile. Based on duration of symptoms, will treat with antibiotic. Advised to stop flonase as suspect has contributed to blood in nasal congestion. Return precautions given.

## 2017-03-07 NOTE — Patient Instructions (Signed)
Take blood pressure medications as soon as you get home. Please call us with blood pressure reading after taking medications.   Start augmentin  Ensure to take probiotics while on antibiotics and also for 2 weeks after completion. It is important to re-colonize the gut with good bacteria and also to prevent any diarrheal infections associated with antibiotic use.   Use albuterol every 6 hours for first 24 hours to get good medication into the lungs and loosen congestion; after, you may use as needed and eventually stop all together when cough resolves.  Stop flonase; use nasal saline spray  Let  us know if you are not better

## 2017-03-07 NOTE — Assessment & Plan Note (Addendum)
No wheezing, adventitous lung sounds today. No labored in speech. [id number]. Refilled albuterol inhaler today to use prn.

## 2017-03-07 NOTE — Progress Notes (Signed)
Subjective:    Patient ID: Melanie Fisher, female    DOB: [birth year], 51 y.o.   MRN: [id number]  CC: Melanie Fisher is a 51 y.o. female who presents today for an acute visit.    HPI: CC: sinus congestion x 3 weeks, unchanged.   Runny nose, grey sputum with cough. No hemoptuso.  Would start sneezing and then would blow her nose, green congestion with blood. Endorses sob, right ear. No wheezing, facial pain, HA, vision changes, fever, sore throat.   Has run out of albuterol inhaler. Robitussin, flonase helps some.   HTN- forgot to take blood pressure medication last night and this morning.   Denies exertional chest pain or pressure, numbness or tingling radiating to left arm or jaw, palpitations, dizziness, frequent headaches, changes in vision, or shortness of breath.   H/o copd      HISTORY:  Past Medical History:  Diagnosis Date  . Allergy   . Anxiety   . Asthma   . Depression   . Heart murmur   . Hyperlipidemia   . Sleep apnea   . Tobacco abuse    Past Surgical History:  Procedure Laterality Date  . ABDOMINAL HYSTERECTOMY  2008   partial  . right toe bunionectomy    . right wrist ganglion cyst removal     Family History  Problem Relation Age of Onset  . Kidney disease Mother 33       ESKD, partial neprhectomy  . COPD Mother   . Heart disease Father        CABG at age 64  . Alcohol abuse Father        until late 81  . Diabetes Father   . Hypertension Father   . Diabetes Sister   . Cancer Sister        uterine ca  . Cancer Maternal Uncle 60       stage 4 colon CA  . Colon cancer Maternal Uncle   . Esophageal cancer Neg Hx   . Pancreatic cancer Neg Hx   . Rectal cancer Neg Hx   . Stomach cancer Neg Hx     Allergies: Patient has no known allergies. Current Outpatient Prescriptions on File Prior to Visit  Medication Sig Dispense Refill  . ALPRAZolam (XANAX) 0.5 MG tablet Take 1 tablet (0.5 mg total) by mouth at bedtime as needed for anxiety.  30 tablet 3  . buPROPion (WELLBUTRIN SR) 150 MG 12 hr tablet Take 1 tablet (150 mg total) by mouth 2 (two) times daily. 180 tablet 0  . chlorpheniramine-HYDROcodone (TUSSIONEX PENNKINETIC ER) 10-8 MG/5ML SUER Take 5 mLs by mouth every 12 (twelve) hours as needed. 115 mL 0  . citalopram (CELEXA) 20 MG tablet Take 1 tablet (20 mg total) by mouth daily. 90 tablet 0  . ergocalciferol (VITAMIN D2) 50000 units capsule Take 1 capsule (50,000 Units total) by mouth once a week. 4 capsule 3  . gabapentin (NEURONTIN) 100 MG capsule Take by mouth.    . isosorbide mononitrate (IMDUR) 30 MG 24 hr tablet Take 1 tablet (30 mg total) by mouth daily. 90 tablet 3  . isosorbide mononitrate (IMDUR) 30 MG 24 hr tablet Take 1 tablet (30 mg total) by mouth daily. 90 tablet 3  . nitroGLYCERIN (NITROSTAT) 0.4 MG SL tablet Place 1 tablet (0.4 mg total) under the tongue every 5 (five) minutes as needed for chest pain. 25 tablet 3  . omeprazole (PRILOSEC) 40 MG capsule Take 1 capsule (40  mg total) by mouth daily. 30 capsule 3  . predniSONE (DELTASONE) 50 MG tablet 1 tablet daily x 5 days. 5 tablet 0   No current facility-administered medications on file prior to visit.     Social History  Substance Use Topics  . Smoking status: Current Every Day Smoker    Packs/day: 1.50    Years: 21.00    Types: Cigarettes  . Smokeless tobacco: Never Used  . Alcohol use Yes     Comment: occasionally    Review of Systems  Constitutional: Negative for chills and fever.  HENT: Positive for congestion and ear pain (right). Negative for sinus pain, sinus pressure and sore throat.   Eyes: Negative for visual disturbance.  Respiratory: Positive for cough and shortness of breath. Negative for wheezing.   Cardiovascular: Negative for chest pain and palpitations.  Gastrointestinal: Negative for nausea and vomiting.  Neurological: Negative for headaches.      Objective:    BP 140/90   Pulse (!) 101   Temp 98.5 F (36.9 C) (Oral)    Ht 5\' 3"  (1.6 m)   Wt 183 lb 9.6 oz (83.3 kg)   SpO2 97%   BMI 32.52 kg/m    Physical Exam  Constitutional: She appears well-developed and well-nourished.  HENT:  Head: Normocephalic and atraumatic.  Right Ear: Hearing, external ear and ear canal normal. No drainage, swelling or tenderness. No foreign bodies. Tympanic membrane is erythematous and bulging. No middle ear effusion. No decreased hearing is noted.  Left Ear: Hearing, external ear and ear canal normal. No drainage, swelling or tenderness. No foreign bodies. Tympanic membrane is erythematous and bulging.  No middle ear effusion. No decreased hearing is noted.  Nose: Nose normal. No rhinorrhea. No epistaxis. Right sinus exhibits no maxillary sinus tenderness and no frontal sinus tenderness. Left sinus exhibits no maxillary sinus tenderness and no frontal sinus tenderness.  Mouth/Throat: Uvula is midline, oropharynx is clear and moist and mucous membranes are normal. No oropharyngeal exudate, posterior oropharyngeal edema, posterior oropharyngeal erythema or tonsillar abscesses.  Dry, irritated, nasal mucosa. No active bleeding seen.   Eyes: Conjunctivae are normal.  Cardiovascular: Regular rhythm, normal heart sounds and normal pulses.   Pulmonary/Chest: Effort normal and breath sounds normal. She has no wheezes. She has no rhonchi. She has no rales.  Lymphadenopathy:       Head (right side): No submental, no submandibular, no tonsillar, no preauricular, no posterior auricular and no occipital adenopathy present.       Head (left side): No submental, no submandibular, no tonsillar, no preauricular, no posterior auricular and no occipital adenopathy present.    She has no cervical adenopathy.  Neurological: She is alert.  Skin: Skin is warm and dry.  Psychiatric: She has a normal mood and affect. Her speech is normal and behavior is normal. Thought content normal.  Vitals reviewed.      Assessment & Plan:   Problem List  Items Addressed This Visit      Cardiovascular and Mediastinum   Essential hypertension    Elevated. Hasn't taken BP medications. No signs or symptoms of hypertensive urgency or emergency at this time. Advised to take medications as soon as gets home and to call us with reading.       Relevant Medications   metoprolol succinate (TOPROL-XL) 25 MG 24 hr tablet     Respiratory   COPD (chronic obstructive pulmonary disease) (HCC)    No wheezing, adventitous lung sounds today. No labored in speech.  [id number]. Refilled albuterol inhaler today to use prn.      Relevant Medications   albuterol (PROVENTIL HFA) 108 (90 Base) MCG/ACT inhaler   Acute non-recurrent frontal sinusitis - Primary    Afebrile. Based on duration of symptoms, will treat with antibiotic. Advised to stop flonase as suspect has contributed to blood in nasal congestion. Return precautions given.       Relevant Medications   amoxicillin-clavulanate (AUGMENTIN) 875-125 MG tablet        I am having Ms. Huffines maintain her nitroGLYCERIN, ALPRAZolam, isosorbide mononitrate, isosorbide mononitrate, buPROPion, citalopram, omeprazole, ergocalciferol, gabapentin, predniSONE, chlorpheniramine-HYDROcodone, amoxicillin-clavulanate, metoprolol succinate, and albuterol.   Meds ordered this encounter  Medications  . DISCONTD: albuterol (PROVENTIL HFA) 108 (90 Base) MCG/ACT inhaler    Sig: Inhale 2 puffs into the lungs every 6 (six) hours as needed for wheezing or shortness of breath.    Dispense:  1 Inhaler    Refill:  1    Order Specific Question:   Supervising Provider    Answer:   Derrel Nip, TERESA L [[id number]]  . DISCONTD: metoprolol succinate (TOPROL-XL) 25 MG 24 hr tablet    Sig: Take 1 tablet (25 mg total) by mouth daily. At bedtime for blood pressure/headache prevention    Dispense:  90 tablet    Refill:  3    Order Specific Question:   Supervising Provider    Answer:   Deborra Medina L [[id number]]  . DISCONTD:  amoxicillin-clavulanate (AUGMENTIN) 875-125 MG tablet    Sig: Take 1 tablet by mouth 2 (two) times daily.    Dispense:  14 tablet    Refill:  0    Order Specific Question:   Supervising Provider    Answer:   Deborra Medina L [[id number]]  . amoxicillin-clavulanate (AUGMENTIN) 875-125 MG tablet    Sig: Take 1 tablet by mouth 2 (two) times daily.    Dispense:  14 tablet    Refill:  0    Order Specific Question:   Supervising Provider    Answer:   Deborra Medina L [[id number]]  . metoprolol succinate (TOPROL-XL) 25 MG 24 hr tablet    Sig: Take 1 tablet (25 mg total) by mouth daily. At bedtime for blood pressure/headache prevention    Dispense:  90 tablet    Refill:  3    Order Specific Question:   Supervising Provider    Answer:   Deborra Medina L [[id number]]  . albuterol (PROVENTIL HFA) 108 (90 Base) MCG/ACT inhaler    Sig: Inhale 2 puffs into the lungs every 6 (six) hours as needed for wheezing or shortness of breath.    Dispense:  1 Inhaler    Refill:  1    Order Specific Question:   Supervising Provider    Answer:   Crecencio Mc [[id number]]    Return precautions given.   Risks, benefits, and alternatives of the medications and treatment plan prescribed today were discussed, and patient expressed understanding.   Education regarding symptom management and diagnosis given to patient on AVS.  Continue to follow with Crecencio Mc, MD for routine health maintenance.   Leonie Man Huffines and I agreed with plan.   Mable Paris, FNP

## 2017-03-14 NOTE — Telephone Encounter (Signed)
error 

## 2017-03-21 ENCOUNTER — Telehealth: Payer: Self-pay | Admitting: Internal Medicine

## 2017-03-21 MED ORDER — ISOSORBIDE MONONITRATE ER 30 MG PO TB24: 30.0000 mg | ORAL_TABLET | Freq: Every day | ORAL | 1 refills | Status: DC

## 2017-03-21 NOTE — Telephone Encounter (Signed)
Pt called needing a refill for medication of isosorbide mononitrate (IMDUR) 30 MG 24 hr tablet.   Pharmacy is [id number] - [place] [address] [place] - [address].  Call pt @ [phone]. Thank you!

## 2017-03-21 NOTE — Telephone Encounter (Signed)
Spoke with pt to let her know that we did refill her medication.

## 2017-04-02 ENCOUNTER — Ambulatory Visit: Payer: Self-pay | Admitting: Internal Medicine

## 2017-04-04 ENCOUNTER — Encounter: Payer: Self-pay | Admitting: Internal Medicine

## 2017-04-04 ENCOUNTER — Ambulatory Visit (INDEPENDENT_AMBULATORY_CARE_PROVIDER_SITE_OTHER): Payer: BC Managed Care – PPO | Admitting: Internal Medicine

## 2017-04-04 VITALS — BP 126/80 | HR 81 | Temp 98.5°F | Resp 16 | Ht 63.0 in | Wt 185.0 lb

## 2017-04-04 DIAGNOSIS — Z23 Encounter for immunization: Secondary | ICD-10-CM | POA: Diagnosis not present

## 2017-04-04 DIAGNOSIS — F458 Other somatoform disorders: Secondary | ICD-10-CM | POA: Diagnosis not present

## 2017-04-04 DIAGNOSIS — F411 Generalized anxiety disorder: Secondary | ICD-10-CM | POA: Diagnosis not present

## 2017-04-04 DIAGNOSIS — R251 Tremor, unspecified: Secondary | ICD-10-CM

## 2017-04-04 DIAGNOSIS — F4321 Adjustment disorder with depressed mood: Secondary | ICD-10-CM | POA: Diagnosis not present

## 2017-04-04 DIAGNOSIS — F418 Other specified anxiety disorders: Secondary | ICD-10-CM | POA: Diagnosis not present

## 2017-04-04 DIAGNOSIS — J441 Chronic obstructive pulmonary disease with (acute) exacerbation: Secondary | ICD-10-CM | POA: Diagnosis not present

## 2017-04-04 LAB — COMPREHENSIVE METABOLIC PANEL
ALT: 22 U/L (ref 0–35)
AST: 20 U/L (ref 0–37)
Albumin: 4.4 g/dL (ref 3.5–5.2)
Alkaline Phosphatase: 67 U/L (ref 39–117)
BUN: 15 mg/dL (ref 6–23)
CO2: 22 mEq/L (ref 19–32)
Calcium: 10.1 mg/dL (ref 8.4–10.5)
Chloride: 103 mEq/L (ref 96–112)
Creatinine, Ser: 0.73 mg/dL (ref 0.40–1.20)
GFR: 88.75 mL/min (ref >60.00)
Glucose, Bld: 107 mg/dL — ABNORMAL HIGH (ref 70–99)
Potassium: 4.4 mEq/L (ref 3.5–5.1)
Sodium: 137 mEq/L (ref 135–145)
Total Bilirubin: 0.5 mg/dL (ref 0.2–1.2)
Total Protein: 7.8 g/dL (ref 6.0–8.3)

## 2017-04-04 LAB — TSH: TSH: 2.92 u[IU]/mL (ref 0.35–4.50)

## 2017-04-04 MED ORDER — ALPRAZOLAM 0.5 MG PO TABS: 0.5000 mg | ORAL_TABLET | Freq: Two times a day (BID) | ORAL | 0 refills | Status: DC | PRN

## 2017-04-04 MED ORDER — HYDROXYZINE HCL 25 MG PO TABS: 25.0000 mg | ORAL_TABLET | Freq: Three times a day (TID) | ORAL | 2 refills | Status: DC | PRN

## 2017-04-04 NOTE — Patient Instructions (Signed)
I am so sorry about your husband passing away  I will e happy to support your request for FMLA .  I will plan to recommend one month off so you can have time to receive grief counselling through [facility]   For you anxiety:  Hydroxyzine every 8 hours for itching  Alprazolam up to twice daily for panic attacks and/or insomnia

## 2017-04-04 NOTE — Progress Notes (Signed)
Subjective:  Patient ID: Melanie Fisher, female    DOB: Dec 03, 1965  Age: 51 y.o. MRN: [id number]  CC: The primary encounter diagnosis was Occasional tremors. Diagnoses of Need for immunization against influenza, COPD exacerbation ([place]), Psychogenic pruritus, Grief reaction, Depression with anxiety, and Generalized anxiety disorder were also pertinent to this visit.  HPI Melanie Fisher presents for management of grief and uncontrolled  Anxiety following the unexpected dieath of her husband of 34 years.   she had to withdraw life support  On Sept 28.  Patient had collapsed in a motel room while attending a training program in [place] on Sept 19th.  She couldn't reach him by phone for 48 hours .  His supervisor found him unresponsive and cyanotic  On the bathroom floor on Sept 20th. He was placed on dialysis and  life support  And  MRI brain showed a large embolic stroke. She withdrew life support  With the support of her sons but with considerable opposition from her her sister in law  (her husband's sister)and the conflict , along with her grief,  Has been emotionally devastating.  [employer] home Sept 29th,  huband's funeral Oct 3.  Patient having uncontrolled anxiety  manifested as chronic neurotic itching .  Has not been to work since Sept 20th.    Still too anxious to drive a car, Having frequent panic attacks when she leaves the house.  She is distraught about the fact that she has been unable to get rid of the taste after kissing her husband's mouth post extubation  Meds:  Taking citalopram and wellbutrin  Regularly  .  2) Was treated for bronchitis on Sept 19th but did not have time to pick up the antibiotics before she left for [facility].  Sputum is clear     Outpatient Medications Prior to Visit  Medication Sig Dispense Refill  . albuterol (PROVENTIL HFA) 108 (90 Base) MCG/ACT inhaler Inhale 2 puffs into the lungs every 6 (six) hours as needed for wheezing or shortness of breath. 1  Inhaler 1  . amoxicillin-clavulanate (AUGMENTIN) 875-125 MG tablet Take 1 tablet by mouth 2 (two) times daily. 14 tablet 0  . buPROPion (WELLBUTRIN SR) 150 MG 12 hr tablet Take 1 tablet (150 mg total) by mouth 2 (two) times daily. 180 tablet 0  . chlorpheniramine-HYDROcodone (TUSSIONEX PENNKINETIC ER) 10-8 MG/5ML SUER Take 5 mLs by mouth every 12 (twelve) hours as needed. 115 mL 0  . citalopram (CELEXA) 20 MG tablet Take 1 tablet (20 mg total) by mouth daily. 90 tablet 0  . ergocalciferol (VITAMIN D2) 50000 units capsule Take 1 capsule (50,000 Units total) by mouth once a week. 4 capsule 3  . gabapentin (NEURONTIN) 100 MG capsule Take by mouth.    . isosorbide mononitrate (IMDUR) 30 MG 24 hr tablet Take 1 tablet (30 mg total) by mouth daily. 90 tablet 3  . isosorbide mononitrate (IMDUR) 30 MG 24 hr tablet Take 1 tablet (30 mg total) by mouth daily. 90 tablet 1  . metoprolol succinate (TOPROL-XL) 25 MG 24 hr tablet Take 1 tablet (25 mg total) by mouth daily. At bedtime for blood pressure/headache prevention 90 tablet 3  . nitroGLYCERIN (NITROSTAT) 0.4 MG SL tablet Place 1 tablet (0.4 mg total) under the tongue every 5 (five) minutes as needed for chest pain. 25 tablet 3  . omeprazole (PRILOSEC) 40 MG capsule Take 1 capsule (40 mg total) by mouth daily. 30 capsule 3  . predniSONE (DELTASONE)  50 MG tablet 1 tablet daily x 5 days. 5 tablet 0  . ALPRAZolam (XANAX) 0.5 MG tablet Take 1 tablet (0.5 mg total) by mouth at bedtime as needed for anxiety. 30 tablet 3   No facility-administered medications prior to visit.     Review of Systems;  Patient denies headache, fevers, malaise, unintentional weight loss, skin rash, eye pain, sinus congestion and sinus pain, sore throat, dysphagia,  hemoptysis , cough, dyspnea, wheezing, chest pain, palpitations, orthopnea, edema, abdominal pain, nausea, melena, diarrhea, constipation, flank pain, dysuria, hematuria, urinary  Frequency, nocturia, numbness, tingling,  seizures,  Focal weakness, Loss of consciousness,  Tremor, insomnia, depression, anxiety, and suicidal ideation.      Objective:  BP 126/80 (BP Location: Left Arm, Patient Position: Sitting, Cuff Size: Normal)   Pulse 81   Temp 98.5 F (36.9 C) (Oral)   Resp 16   Ht 5\' 3"  (1.6 m)   Wt 185 lb (83.9 kg)   SpO2 97%   BMI 32.77 kg/m   BP Readings from Last 3 Encounters:  04/04/17 126/80  03/07/17 140/90  12/15/16 (!) 156/66    Wt Readings from Last 3 Encounters:  04/04/17 185 lb (83.9 kg)  03/07/17 183 lb 9.6 oz (83.3 kg)  12/15/16 195 lb 12.8 oz (88.8 kg)    General appearance: alert, cooperative and appears stated age Ears: normal TM's and external ear canals both ears Throat: lips, mucosa, and tongue normal; teeth and gums normal Neck: no adenopathy, no carotid bruit, supple, symmetrical, trachea midline and thyroid not enlarged, symmetric, no tenderness/mass/nodules Back: symmetric, no curvature. ROM normal. No CVA tenderness. Lungs: clear to auscultation bilaterally Heart: regular rate and rhythm, S1, S2 normal, no murmur, click, rub or gallop Abdomen: soft, non-tender; bowel sounds normal; no masses,  no organomegaly Pulses: 2+ and symmetric Skin: Skin color, texture, turgor normal. No rashes or lesions Lymph nodes: Cervical, supraclavicular, and axillary nodes normal.  Lab Results  Component Value Date   HGBA1C 5.4 05/29/2016   HGBA1C 5.4 04/09/2015   HGBA1C 5.7 05/07/2012    Lab Results  Component Value Date   CREATININE 0.73 04/04/2017   CREATININE 0.85 05/23/2016   CREATININE 0.72 04/09/2015    Lab Results  Component Value Date   WBC 6.0 05/23/2016   HGB 13.7 05/23/2016   HCT 40.2 05/23/2016   PLT 261.0 05/23/2016   GLUCOSE 107 (H) 04/04/2017   CHOL 168 04/09/2015   TRIG 64 04/09/2015   HDL 62 04/09/2015   LDLDIRECT 131.0 05/23/2016   LDLCALC 93 04/09/2015   ALT 22 04/04/2017   AST 20 04/04/2017   NA 137 04/04/2017   K 4.4 04/04/2017   CL  103 04/04/2017   CREATININE 0.73 04/04/2017   BUN 15 04/04/2017   CO2 22 04/04/2017   TSH 2.92 04/04/2017   HGBA1C 5.4 05/29/2016    No results found.  Assessment & Plan:   Problem List Items Addressed This Visit    COPD exacerbation ([place])    No wheezing, adventitious lung sounds today. No labored speech. SATs normal..   Resume treatment prescribed by margaret  including prn use of albuterol inhaler today to use prn.      Depression with anxiety    Continue wellbutrin and celexa.       Relevant Medications   hydrOXYzine (ATARAX/VISTARIL) 25 MG tablet   ALPRAZolam (XANAX) 0.5 MG tablet   Generalized anxiety disorder    Continue citalopram and alprazolam for insomnia and panic attacks.  Relevant Medications   hydrOXYzine (ATARAX/VISTARIL) 25 MG tablet   ALPRAZolam (XANAX) 0.5 MG tablet   Grief reaction    Patient is dealing with the unexpected loss of spouse and has inadequate coping skills and emotional support .  i have recommended patient attend grief counselling at hospice and support a request for one month FMLA       Psychogenic pruritus    Hydroxyzine refilled       Other Visit Diagnoses    Occasional tremors    -  Primary   Relevant Orders   Comprehensive metabolic panel (Completed)   TSH (Completed)   Need for immunization against influenza       Relevant Orders   Flu Vaccine QUAD 36+ mos IM (Completed)     A total of 25 minutes of face to face time was spent with patient more than half of which was spent in counselling about the above mentioned conditions  and coordination of care  I have changed Ms. Huffines's ALPRAZolam. I am also having her start on hydrOXYzine. Additionally, I am having her maintain her nitroGLYCERIN, isosorbide mononitrate, buPROPion, citalopram, omeprazole, ergocalciferol, gabapentin, predniSONE, chlorpheniramine-HYDROcodone, amoxicillin-clavulanate, metoprolol succinate, albuterol, and isosorbide mononitrate.  Meds ordered  this encounter  Medications  . hydrOXYzine (ATARAX/VISTARIL) 25 MG tablet    Sig: Take 1 tablet (25 mg total) by mouth 3 (three) times daily as needed.    Dispense:  90 tablet    Refill:  2  . ALPRAZolam (XANAX) 0.5 MG tablet    Sig: Take 1 tablet (0.5 mg total) by mouth 2 (two) times daily as needed for anxiety.    Dispense:  60 tablet    Refill:  0    Medications Discontinued During This Encounter  Medication Reason  . ALPRAZolam (XANAX) 0.5 MG tablet Reorder    Follow-up: No Follow-up on file.   Crecencio Mc, MD

## 2017-04-06 DIAGNOSIS — Z0279 Encounter for issue of other medical certificate: Secondary | ICD-10-CM

## 2017-04-07 DIAGNOSIS — F4321 Adjustment disorder with depressed mood: Secondary | ICD-10-CM | POA: Insufficient documentation

## 2017-04-07 DIAGNOSIS — F458 Other somatoform disorders: Secondary | ICD-10-CM | POA: Insufficient documentation

## 2017-04-07 NOTE — Assessment & Plan Note (Signed)
Hydroxyzine refilled

## 2017-04-07 NOTE — Assessment & Plan Note (Signed)
No wheezing, adventitious lung sounds today. No labored speech. SATs normal..   Resume treatment prescribed by margaret  including prn use of albuterol inhaler today to use prn.

## 2017-04-07 NOTE — Assessment & Plan Note (Signed)
Patient is dealing with the unexpected loss of spouse and has inadequate coping skills and emotional support .  i have recommended patient attend grief counselling at hospice and support a request for one month FMLA

## 2017-04-07 NOTE — Assessment & Plan Note (Signed)
Continue citalopram and alprazolam for insomnia and panic attacks.

## 2017-04-07 NOTE — Assessment & Plan Note (Signed)
Continue wellbutrin and celexa.

## 2017-04-08 ENCOUNTER — Telehealth: Payer: Self-pay | Admitting: Internal Medicine

## 2017-04-08 NOTE — Telephone Encounter (Signed)
FMLA form complete.  please charge $50.  Red folder

## 2017-04-10 NOTE — Telephone Encounter (Signed)
Spoke with pt to let her know that the paperwork is complete. Pt asked that we fax it to the number on the form. Paperwork has been faxed.

## 2017-05-02 ENCOUNTER — Encounter: Payer: Self-pay | Admitting: Internal Medicine

## 2017-05-02 ENCOUNTER — Ambulatory Visit (INDEPENDENT_AMBULATORY_CARE_PROVIDER_SITE_OTHER): Payer: BC Managed Care – PPO | Admitting: Internal Medicine

## 2017-05-02 ENCOUNTER — Ambulatory Visit (INDEPENDENT_AMBULATORY_CARE_PROVIDER_SITE_OTHER): Payer: BC Managed Care – PPO

## 2017-05-02 VITALS — BP 112/70 | HR 81 | Temp 98.3°F | Resp 16 | Ht 63.0 in | Wt 187.8 lb

## 2017-05-02 DIAGNOSIS — R059 Cough, unspecified: Secondary | ICD-10-CM

## 2017-05-02 DIAGNOSIS — R7301 Impaired fasting glucose: Secondary | ICD-10-CM

## 2017-05-02 DIAGNOSIS — F418 Other specified anxiety disorders: Secondary | ICD-10-CM

## 2017-05-02 DIAGNOSIS — R05 Cough: Secondary | ICD-10-CM

## 2017-05-02 DIAGNOSIS — J439 Emphysema, unspecified: Secondary | ICD-10-CM | POA: Diagnosis not present

## 2017-05-02 LAB — CBC WITH DIFFERENTIAL/PLATELET
Basophils Absolute: 0 10*3/uL (ref 0.0–0.1)
Basophils Relative: 0.6 % (ref 0.0–3.0)
Eosinophils Absolute: 0.1 10*3/uL (ref 0.0–0.7)
Eosinophils Relative: 2.4 % (ref 0.0–5.0)
HCT: 42 % (ref 36.0–46.0)
Hemoglobin: 14.1 g/dL (ref 12.0–15.0)
Lymphocytes Relative: 36 % (ref 12.0–46.0)
Lymphs Abs: 1.5 10*3/uL (ref 0.7–4.0)
MCHC: 33.7 g/dL (ref 30.0–36.0)
MCV: 97.3 fl (ref 78.0–100.0)
Monocytes Absolute: 0.4 10*3/uL (ref 0.1–1.0)
Monocytes Relative: 8.9 % (ref 3.0–12.0)
Neutro Abs: 2.2 10*3/uL (ref 1.4–7.7)
Neutrophils Relative %: 52.1 % (ref 43.0–77.0)
Platelets: 221 10*3/uL (ref 150.0–400.0)
RBC: 4.32 Mil/uL (ref 3.87–5.11)
RDW: 12.7 % (ref 11.5–15.5)
WBC: 4.2 10*3/uL (ref 4.0–10.5)

## 2017-05-02 LAB — HEMOGLOBIN A1C: Hgb A1c MFr Bld: 5.6 % (ref 4.6–6.5)

## 2017-05-02 NOTE — Patient Instructions (Addendum)
I agree with extending your FMLA until November 26.  I want you to make yourself leave the house daily and go for a walk.  You can use the alprazolam in 1/2 dose if you wake up early and cannot go back to sleep   Your cough is from smoking.  You do not need antibiotics unless your labs an chest x ray suggest that you have an infection .   QUITTING SMOKING IS THE REMEDY!!

## 2017-05-02 NOTE — Progress Notes (Signed)
Subjective:  Patient ID: Melanie Fisher, female    DOB: [birth year]  Age: 51 y.o. MRN: [id number]  CC: The primary encounter diagnosis was Impaired fasting glucose. Diagnoses of Cough, Pulmonary emphysema, unspecified emphysema type ([place]), and Depression with anxiety were also pertinent to this visit.  HPI Melanie Fisher presents for follow up  complicated  grief reaction following hte unexpected death of her husband while he was out of town attending a training session in [place].  Patient has been on FMLA leave since her last visit and is here for follow up.  She continues to have severe anxiety with panic attacks and agoraphobia.  Has been driving short distances,  during the day, without incident.   4 to 5 panic attacks in the last week  brought on by having to leave the house.  She has been taking xanax once daily   Has not tried taking it for the insomnia and early wakenings.     Sleeping fitfully,  Lots of wakeups but no night terrors.   Sons are supportive and all 3 are currenlty living with her  (30, 55 and 26)  There is no voiced animosity with her sister In law, just silence and lack of contact..  Still having a productive cough  . Sputum is thick and white. Cough is worse in the morning,  Makes her vomit at times .  No fevers, dyspnea or wheezing , but not walking or exercising.   Still smoking.  Has signed up for a no smoking program via wellness campaign at [facility]...     Outpatient Medications Prior to Visit  Medication Sig Dispense Refill  . albuterol (PROVENTIL HFA) 108 (90 Base) MCG/ACT inhaler Inhale 2 puffs into the lungs every 6 (six) hours as needed for wheezing or shortness of breath. 1 Inhaler 1  . ALPRAZolam (XANAX) 0.5 MG tablet Take 1 tablet (0.5 mg total) by mouth 2 (two) times daily as needed for anxiety. 60 tablet 0  . amoxicillin-clavulanate (AUGMENTIN) 875-125 MG tablet Take 1 tablet by mouth 2 (two) times daily. 14 tablet 0  . buPROPion (WELLBUTRIN SR) 150  MG 12 hr tablet Take 1 tablet (150 mg total) by mouth 2 (two) times daily. 180 tablet 0  . chlorpheniramine-HYDROcodone (TUSSIONEX PENNKINETIC ER) 10-8 MG/5ML SUER Take 5 mLs by mouth every 12 (twelve) hours as needed. 115 mL 0  . citalopram (CELEXA) 20 MG tablet Take 1 tablet (20 mg total) by mouth daily. 90 tablet 0  . ergocalciferol (VITAMIN D2) 50000 units capsule Take 1 capsule (50,000 Units total) by mouth once a week. 4 capsule 3  . gabapentin (NEURONTIN) 100 MG capsule Take by mouth.    . hydrOXYzine (ATARAX/VISTARIL) 25 MG tablet Take 1 tablet (25 mg total) by mouth 3 (three) times daily as needed. 90 tablet 2  . isosorbide mononitrate (IMDUR) 30 MG 24 hr tablet Take 1 tablet (30 mg total) by mouth daily. 90 tablet 3  . isosorbide mononitrate (IMDUR) 30 MG 24 hr tablet Take 1 tablet (30 mg total) by mouth daily. 90 tablet 1  . metoprolol succinate (TOPROL-XL) 25 MG 24 hr tablet Take 1 tablet (25 mg total) by mouth daily. At bedtime for blood pressure/headache prevention 90 tablet 3  . nitroGLYCERIN (NITROSTAT) 0.4 MG SL tablet Place 1 tablet (0.4 mg total) under the tongue every 5 (five) minutes as needed for chest pain. 25 tablet 3  . omeprazole (PRILOSEC) 40 MG capsule Take 1 capsule (40 mg total) by  mouth daily. 30 capsule 3  . predniSONE (DELTASONE) 50 MG tablet 1 tablet daily x 5 days. 5 tablet 0   No facility-administered medications prior to visit.     Review of Systems;  Patient denies headache, fevers, malaise, unintentional weight loss, skin rash, eye pain, sinus congestion and sinus pain, sore throat, dysphagia,  hemoptysis , cough, dyspnea, wheezing, chest pain, palpitations, orthopnea, edema, abdominal pain, nausea, melena, diarrhea, constipation, flank pain, dysuria, hematuria, urinary  Frequency, nocturia, numbness, tingling, seizures,  Focal weakness, Loss of consciousness,  Tremor, insomnia, depression, anxiety, and suicidal ideation.      Objective:  BP 112/70 (BP  Location: Left Arm, Patient Position: Sitting, Cuff Size: Normal)   Pulse 81   Temp 98.3 F (36.8 C) (Oral)   Resp 16   Ht 5\' 3"  (1.6 m)   Wt 187 lb 12.8 oz (85.2 kg)   SpO2 97%   BMI 33.27 kg/m   BP Readings from Last 3 Encounters:  05/02/17 112/70  04/04/17 126/80  03/07/17 140/90    Wt Readings from Last 3 Encounters:  05/02/17 187 lb 12.8 oz (85.2 kg)  04/04/17 185 lb (83.9 kg)  03/07/17 183 lb 9.6 oz (83.3 kg)    General appearance: alert, cooperative and appears stated age Ears: normal TM's and external ear canals both ears Throat: lips, mucosa, and tongue normal; teeth and gums normal Neck: no adenopathy, no carotid bruit, supple, symmetrical, trachea midline and thyroid not enlarged, symmetric, no tenderness/mass/nodules Back: symmetric, no curvature. ROM normal. No CVA tenderness. Lungs: clear to auscultation bilaterally Heart: regular rate and rhythm, S1, S2 normal, no murmur, click, rub or gallop Abdomen: soft, non-tender; bowel sounds normal; no masses,  no organomegaly Pulses: 2+ and symmetric Skin: Skin color, texture, turgor normal. No rashes or lesions Lymph nodes: Cervical, supraclavicular, and axillary nodes normal.  Lab Results  Component Value Date   HGBA1C 5.6 05/02/2017   HGBA1C 5.4 05/29/2016   HGBA1C 5.4 04/09/2015    Lab Results  Component Value Date   CREATININE 0.73 04/04/2017   CREATININE 0.85 05/23/2016   CREATININE 0.72 04/09/2015    Lab Results  Component Value Date   WBC 4.2 05/02/2017   HGB 14.1 05/02/2017   HCT 42.0 05/02/2017   PLT 221.0 05/02/2017   GLUCOSE 107 (H) 04/04/2017   CHOL 168 04/09/2015   TRIG 64 04/09/2015   HDL 62 04/09/2015   LDLDIRECT 131.0 05/23/2016   LDLCALC 93 04/09/2015   ALT 22 04/04/2017   AST 20 04/04/2017   NA 137 04/04/2017   K 4.4 04/04/2017   CL 103 04/04/2017   CREATININE 0.73 04/04/2017   BUN 15 04/04/2017   CO2 22 04/04/2017   TSH 2.92 04/04/2017   HGBA1C 5.6 05/02/2017    No  results found.  Assessment & Plan:   Problem List Items Addressed This Visit    COPD (chronic obstructive pulmonary disease) ([place])    She has a chronic productive cough .  Chest  Ray was done today to rule out pneumonia and masses.  Smoking cessation advised.       Depression with anxiety    Aggravated by recent unexpected death of husband while out of town.  Her panic attacks are still occurring nearly daily,  But she is now able to drive short distances without having one.  Advised to leave the house daily for a walk,  Extending FMLA until the Monday after Thanksgiving. (one week extension)  Other Visit Diagnoses    Impaired fasting glucose    -  Primary   Relevant Orders   Hemoglobin A1c (Completed)   Cough       Relevant Orders   DG Chest 2 View (Completed)   CBC with Differential/Platelet (Completed)    A total of 25 minutes of face to face time was spent with patient more than half of which was spent in counselling about the above mentioned conditions  and coordination of care   I am having Melanie Fisher maintain her nitroGLYCERIN, isosorbide mononitrate, buPROPion, citalopram, omeprazole, ergocalciferol, gabapentin, predniSONE, chlorpheniramine-HYDROcodone, amoxicillin-clavulanate, metoprolol succinate, albuterol, isosorbide mononitrate, hydrOXYzine, and ALPRAZolam.  No orders of the defined types were placed in this encounter.   There are no discontinued medications.  Follow-up: No Follow-up on file.   Crecencio Mc, MD

## 2017-05-04 ENCOUNTER — Encounter: Payer: Self-pay | Admitting: Internal Medicine

## 2017-05-05 NOTE — Assessment & Plan Note (Addendum)
She has a chronic productive cough .  Chest  Ray was done today to rule out pneumonia and masses.  Smoking cessation advised.

## 2017-05-05 NOTE — Assessment & Plan Note (Signed)
Aggravated by recent unexpected death of husband while out of town.  Her panic attacks are still occurring nearly daily,  But she is now able to drive short distances without having one.  Advised to leave the house daily for a walk,  Extending FMLA until the Monday after Thanksgiving. (one week extension)

## 2017-08-16 ENCOUNTER — Encounter: Payer: Self-pay | Admitting: Family Medicine

## 2017-08-16 ENCOUNTER — Ambulatory Visit: Payer: BC Managed Care – PPO | Admitting: Family Medicine

## 2017-08-16 VITALS — BP 144/96 | HR 86 | Temp 98.4°F | Resp 16 | Ht 63.0 in | Wt 187.6 lb

## 2017-08-16 DIAGNOSIS — J101 Influenza due to other identified influenza virus with other respiratory manifestations: Secondary | ICD-10-CM | POA: Diagnosis not present

## 2017-08-16 DIAGNOSIS — R6889 Other general symptoms and signs: Secondary | ICD-10-CM | POA: Diagnosis not present

## 2017-08-16 LAB — POCT INFLUENZA A/B
Influenza A, POC: POSITIVE — AB
Influenza B, POC: NEGATIVE

## 2017-08-16 MED ORDER — ONDANSETRON HCL 4 MG PO TABS: 4.0000 mg | ORAL_TABLET | Freq: Three times a day (TID) | ORAL | 0 refills | Status: DC | PRN

## 2017-08-16 MED ORDER — OSELTAMIVIR PHOSPHATE 75 MG PO CAPS: 75.0000 mg | ORAL_CAPSULE | Freq: Two times a day (BID) | ORAL | 0 refills | Status: DC

## 2017-08-16 NOTE — Progress Notes (Signed)
Patient ID: Melanie Fisher, female   DOB: 1965-07-25, 52 y.o.   MRN: [id number]  PCP: Crecencio Mc, MD  Subjective:  Melanie Fisher is a 52 y.o. year old very pleasant female patient who presents with flu like symptoms including fever,body aches.  -other symptoms: nausea,  one episode of vomiting associated with cough, one episode of loose stools that have improved, and cough that is nonproductive. Prior sore throat has resolved. Cough has improved.  -started: 2 days ago. She first reported symptoms started 3 days however with further review, symptoms started 2 days ago -inside hour treatment window if needed for tamiflu: yes -high risk condition: No, history of COPD present -symptoms are improving -previous treatments: none - patient did not receive flu shot this year.  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 Psychogenic pruritus 04/07/2017  . Grief reaction 04/07/2017  . COPD exacerbation ([place]) 12/15/2016  . Hematoma and contusion 05/24/2016  . Insomnia due to anxiety and fear 04/10/2015  . Polyneuropathy 02/10/2015  . Cervical disc disorder with radiculopathy of cervical region 12/25/2014  . Essential hypertension 08/19/2014  . Slipped rib syndrome 03/23/2014  . Degenerative disc disease, thoracic 03/23/2014  . Nonallopathic lesion of thoracic region 03/23/2014  . Nonallopathic lesion-rib cage 03/23/2014  . Degenerative disk disease 02/12/2014  . Personal history of colonic polyps 02/11/2014  . Other chest pain 11/18/2013  . Obesity 07/01/2013  . Tobacco abuse counseling 03/15/2013  . Routine general medical examination at a health care facility 09/04/2012  . COPD (chronic obstructive pulmonary disease) ([place]) 05/26/2012  . Chronic shoulder pain 04/09/2012  .  Generalized anxiety disorder 04/06/2012  . Tobacco abuse 04/06/2012  . Depression with anxiety   . Heart murmur   . Hyperlipidemia   . Allergy     Medications- reviewed  Current Outpatient Medications  Medication Sig Dispense Refill  . albuterol (PROVENTIL HFA) 108 (90 Base) MCG/ACT inhaler Inhale 2 puffs into the lungs every 6 (six) hours as needed for wheezing or shortness of breath. 1 Inhaler 1  . ALPRAZolam (XANAX) 0.5 MG tablet Take 1 tablet (0.5 mg total) by mouth 2 (two) times daily as needed for anxiety. 60 tablet 0  . amoxicillin-clavulanate (AUGMENTIN) 875-125 MG tablet Take 1 tablet by mouth 2 (two) times daily. 14 tablet 0  . buPROPion (WELLBUTRIN SR) 150 MG 12 hr tablet Take 1 tablet (150 mg total) by mouth 2 (two) times daily. 180 tablet 0  . chlorpheniramine-HYDROcodone (TUSSIONEX PENNKINETIC ER) 10-8 MG/5ML SUER Take 5 mLs by mouth every 12 (twelve) hours as needed. 115 mL 0  . citalopram (CELEXA) 20 MG tablet Take 1 tablet (20 mg total) by mouth daily. 90 tablet 0  . ergocalciferol (VITAMIN D2) 50000 units capsule Take 1 capsule (50,000 Units total) by mouth once a week. 4 capsule 3  . gabapentin (NEURONTIN) 100 MG capsule Take by mouth.    . hydrOXYzine (ATARAX/VISTARIL) 25 MG tablet Take 1 tablet (25 mg total) by mouth 3 (three) times daily as needed. 90 tablet 2  . isosorbide mononitrate (IMDUR) 30 MG 24 hr tablet Take 1 tablet (30 mg total) by mouth daily. 90 tablet 3  . isosorbide mononitrate (IMDUR) 30 MG 24 hr tablet Take 1 tablet (30 mg total) by mouth daily. 90 tablet  1  . metoprolol succinate (TOPROL-XL) 25 MG 24 hr tablet Take 1 tablet (25 mg total) by mouth daily. At bedtime for blood pressure/headache prevention 90 tablet 3  . nitroGLYCERIN (NITROSTAT) 0.4 MG SL tablet Place 1 tablet (0.4 mg total) under the tongue every 5 (five) minutes as needed for chest pain. 25 tablet 3  . omeprazole (PRILOSEC) 40 MG capsule Take 1 capsule (40 mg total) by mouth daily. 30  capsule 3  . predniSONE (DELTASONE) 50 MG tablet 1 tablet daily x 5 days. 5 tablet 0   No current facility-administered medications for this visit.     Objective: BP (!) 148/98 (BP Location: Right Arm, Patient Position: Sitting, Cuff Size: Normal)   Pulse 86   Temp 98.4 F (36.9 C) (Oral)   Resp 16   Ht 5\' 3"  (1.6 m)   Wt 187 lb 9.6 oz (85.1 kg)   SpO2 94%   BMI 33.23 kg/m  Gen: NAD, appears fatigued HEENT: Turbinates erythematous, TMs normal bilaterally, pharynx mildly erythematous with no tonsilar exudate or edema, no sinus tenderness CV: RRR no murmurs rubs or gallops Lungs: CTAB no crackles, wheeze, rhonchi Abdomen: soft/nontender/nondistended/normal bowel sounds. Ext: no edema Skin: warm, dry, no rash  Results for orders placed or performed in visit on 08/16/17 (from the past 24 hour(s))  POCT Influenza A/B     Status: Abnormal   Collection Time: 08/16/17  1:54 PM  Result Value Ref Range   Influenza A, POC Positive (A) Negative   Influenza B, POC Negative Negative    Assessment/Plan: 1. Influenza A Positive for Influenza A; symptoms are improving however nausea and myalgias remain. No fever in office today. Will provide tamiflu and ondansetron for nausea. Retake of BP: 144/96; toprol has not been taken today per patient however she will take regularly prescribed medication when she returns home. Advised patient on supportive measures:  Get rest, drink plenty of fluids, and use tylenol as needed for discomfort. Follow up if fever >101, if symptoms worsen or if symptoms are not improved in 3 to 4 days.  Note for work provided. Patient verbalizes understanding.   - oseltamivir (TAMIFLU) 75 MG capsule; Take 1 capsule (75 mg total) by mouth 2 (two) times daily.  Dispense: 10 capsule; Refill: 0 - ondansetron (ZOFRAN) 4 MG tablet; Take 1 tablet (4 mg total) by mouth every 8 (eight) hours as needed for nausea or vomiting.  Dispense: 20 tablet; Refill: 0  2. Flu-like symptoms  -  POCT Influenza A/B   Finally, we reviewed reasons to return to care including if symptoms worsen or persist or new concerns arise.   Laurita Quint, FNP

## 2017-08-16 NOTE — Patient Instructions (Signed)
Please take medication as directed and if symptoms do not improve with treatment, worsen, or you develop new symptoms, follow up for further evaluation and treatment. Feel better soon  Influenza, Adult Influenza ("the flu") is an infection in the lungs, nose, and throat (respiratory tract). It is caused by a virus. The flu causes many common cold symptoms, as well as a high fever and body aches. It can make you feel very sick. The flu spreads easily from person to person (is contagious). Getting a flu shot (influenza vaccination) every year is the best way to prevent the flu. Follow these instructions at home:  Take over-the-counter and prescription medicines only as told by your doctor.  Use a cool mist humidifier to add moisture (humidity) to the air in your home. This can make it easier to breathe.  Rest as needed.  Drink enough fluid to keep your pee (urine) clear or pale yellow.  Cover your mouth and nose when you cough or sneeze.  Wash your hands with soap and water often, especially after you cough or sneeze. If you cannot use soap and water, use hand sanitizer.  Stay home from work or school as told by your doctor. Unless you are visiting your doctor, try to avoid leaving home until your fever has been gone for 24 hours without the use of medicine.  Keep all follow-up visits as told by your doctor. This is important. How is this prevented?  Getting a yearly (annual) flu shot is the best way to avoid getting the flu. You may get the flu shot in late summer, fall, or winter. Ask your doctor when you should get your flu shot.  Wash your hands often or use hand sanitizer often.  Avoid contact with people who are sick during cold and flu season.  Eat healthy foods.  Drink plenty of fluids.  Get enough sleep.  Exercise regularly. Contact a doctor if:  You get new symptoms.  You have: ? Chest pain. ? Watery poop (diarrhea). ? A fever.  Your cough gets worse.  You  start to have more mucus.  You feel sick to your stomach (nauseous).  You throw up (vomit). Get help right away if:  You start to be short of breath or have trouble breathing.  Your skin or nails turn a bluish color.  You have very bad pain or stiffness in your neck.  You get a sudden headache.  You get sudden pain in your face or ear.  You cannot stop throwing up. This information is not intended to replace advice given to you by your health care provider. Make sure you discuss any questions you have with your health care provider. Document Released: 03/14/2008 Document Revised: 11/11/2015 Document Reviewed: 03/30/2015 Elsevier Interactive Patient Education  2017 [employer].

## 2018-02-07 ENCOUNTER — Other Ambulatory Visit: Payer: Self-pay | Admitting: Internal Medicine

## 2018-03-22 ENCOUNTER — Other Ambulatory Visit: Payer: Self-pay | Admitting: Family

## 2018-03-22 DIAGNOSIS — I10 Essential (primary) hypertension: Secondary | ICD-10-CM

## 2018-03-27 ENCOUNTER — Ambulatory Visit: Payer: BC Managed Care – PPO | Admitting: Family Medicine

## 2018-03-27 ENCOUNTER — Encounter: Payer: Self-pay | Admitting: Family Medicine

## 2018-03-27 VITALS — BP 152/90 | HR 94 | Temp 98.4°F | Ht 65.0 in | Wt 195.2 lb

## 2018-03-27 DIAGNOSIS — M546 Pain in thoracic spine: Secondary | ICD-10-CM | POA: Diagnosis not present

## 2018-03-27 DIAGNOSIS — Z23 Encounter for immunization: Secondary | ICD-10-CM | POA: Diagnosis not present

## 2018-03-27 DIAGNOSIS — K219 Gastro-esophageal reflux disease without esophagitis: Secondary | ICD-10-CM

## 2018-03-27 DIAGNOSIS — M62830 Muscle spasm of back: Secondary | ICD-10-CM

## 2018-03-27 LAB — CBC WITH DIFFERENTIAL/PLATELET
Basophils Absolute: 0 10*3/uL (ref 0.0–0.1)
Basophils Relative: 0.6 % (ref 0.0–3.0)
Eosinophils Absolute: 0.1 10*3/uL (ref 0.0–0.7)
Eosinophils Relative: 1.1 % (ref 0.0–5.0)
HCT: 38.6 % (ref 36.0–46.0)
Hemoglobin: 13.3 g/dL (ref 12.0–15.0)
Lymphocytes Relative: 29 % (ref 12.0–46.0)
Lymphs Abs: 1.4 10*3/uL (ref 0.7–4.0)
MCHC: 34.4 g/dL (ref 30.0–36.0)
MCV: 97 fl (ref 78.0–100.0)
Monocytes Absolute: 0.3 10*3/uL (ref 0.1–1.0)
Monocytes Relative: 7.1 % (ref 3.0–12.0)
Neutro Abs: 3 10*3/uL (ref 1.4–7.7)
Neutrophils Relative %: 62.2 % (ref 43.0–77.0)
Platelets: 175 10*3/uL (ref 150.0–400.0)
RBC: 3.98 Mil/uL (ref 3.87–5.11)
RDW: 12.5 % (ref 11.5–15.5)
WBC: 4.9 10*3/uL (ref 4.0–10.5)

## 2018-03-27 LAB — COMPREHENSIVE METABOLIC PANEL
ALT: 13 U/L (ref 0–35)
AST: 13 U/L (ref 0–37)
Albumin: 4.1 g/dL (ref 3.5–5.2)
Alkaline Phosphatase: 56 U/L (ref 39–117)
BUN: 16 mg/dL (ref 6–23)
CO2: 27 mEq/L (ref 19–32)
Calcium: 9.6 mg/dL (ref 8.4–10.5)
Chloride: 109 mEq/L (ref 96–112)
Creatinine, Ser: 0.86 mg/dL (ref 0.40–1.20)
GFR: 73.19 mL/min (ref >60.00)
Glucose, Bld: 124 mg/dL — ABNORMAL HIGH (ref 70–99)
Potassium: 4 mEq/L (ref 3.5–5.1)
Sodium: 139 mEq/L (ref 135–145)
Total Bilirubin: 0.5 mg/dL (ref 0.2–1.2)
Total Protein: 7.1 g/dL (ref 6.0–8.3)

## 2018-03-27 MED ORDER — OMEPRAZOLE 40 MG PO CPDR: 40.0000 mg | DELAYED_RELEASE_CAPSULE | Freq: Every day | ORAL | 1 refills | Status: DC

## 2018-03-27 MED ORDER — PREDNISONE 10 MG (21) PO TBPK: ORAL_TABLET | ORAL | 0 refills | Status: DC

## 2018-03-27 MED ORDER — CYCLOBENZAPRINE HCL 5 MG PO TABS: 5.0000 mg | ORAL_TABLET | Freq: Three times a day (TID) | ORAL | 1 refills | Status: DC | PRN

## 2018-03-27 NOTE — Patient Instructions (Signed)
biofreeze is a good OTC option for pain/muscle soreness   Back Exercises If you have pain in your back, do these exercises 2-3 times each day or as told by your doctor. When the pain goes away, do the exercises once each day, but repeat the steps more times for each exercise (do more repetitions). If you do not have pain in your back, do these exercises once each day or as told by your doctor. Exercises Single Knee to Chest  Do these steps 3-5 times in a row for each leg: 1. Lie on your back on a firm bed or the floor with your legs stretched out. 2. Bring one knee to your chest. 3. Hold your knee to your chest by grabbing your knee or thigh. 4. Pull on your knee until you feel a gentle stretch in your lower back. 5. Keep doing the stretch for 10-30 seconds. 6. Slowly let go of your leg and straighten it.  Pelvic Tilt  Do these steps 5-10 times in a row: 1. Lie on your back on a firm bed or the floor with your legs stretched out. 2. Bend your knees so they point up to the ceiling. Your feet should be flat on the floor. 3. Tighten your lower belly (abdomen) muscles to press your lower back against the floor. This will make your tailbone point up to the ceiling instead of pointing down to your feet or the floor. 4. Stay in this position for 5-10 seconds while you gently tighten your muscles and breathe evenly.  Cat-Cow  Do these steps until your lower back bends more easily: 1. Get on your hands and knees on a firm surface. Keep your hands under your shoulders, and keep your knees under your hips. You may put padding under your knees. 2. Let your head hang down, and make your tailbone point down to the floor so your lower back is round like the back of a cat. 3. Stay in this position for 5 seconds. 4. Slowly lift your head and make your tailbone point up to the ceiling so your back hangs low (sags) like the back of a cow. 5. Stay in this position for 5 seconds.  Press-Ups  Do these  steps 5-10 times in a row: 1. Lie on your belly (face-down) on the floor. 2. Place your hands near your head, about shoulder-width apart. 3. While you keep your back relaxed and keep your hips on the floor, slowly straighten your arms to raise the top half of your body and lift your shoulders. Do not use your back muscles. To make yourself more comfortable, you may change where you place your hands. 4. Stay in this position for 5 seconds. 5. Slowly return to lying flat on the floor.  Bridges  Do these steps 10 times in a row: 1. Lie on your back on a firm surface. 2. Bend your knees so they point up to the ceiling. Your feet should be flat on the floor. 3. Tighten your butt muscles and lift your butt off of the floor until your waist is almost as high as your knees. If you do not feel the muscles working in your butt and the back of your thighs, slide your feet 1-2 inches farther away from your butt. 4. Stay in this position for 3-5 seconds. 5. Slowly lower your butt to the floor, and let your butt muscles relax.  If this exercise is too easy, try doing it with your arms crossed over  your chest. Belly Crunches  Do these steps 5-10 times in a row: 1. Lie on your back on a firm bed or the floor with your legs stretched out. 2. Bend your knees so they point up to the ceiling. Your feet should be flat on the floor. 3. Cross your arms over your chest. 4. Tip your chin a little bit toward your chest but do not bend your neck. 5. Tighten your belly muscles and slowly raise your chest just enough to lift your shoulder blades a tiny bit off of the floor. 6. Slowly lower your chest and your head to the floor.  Back Lifts Do these steps 5-10 times in a row: 1. Lie on your belly (face-down) with your arms at your sides, and rest your forehead on the floor. 2. Tighten the muscles in your legs and your butt. 3. Slowly lift your chest off of the floor while you keep your hips on the floor. Keep the  back of your head in line with the curve in your back. Look at the floor while you do this. 4. Stay in this position for 3-5 seconds. 5. Slowly lower your chest and your face to the floor.  Contact a doctor if:  Your back pain gets a lot worse when you do an exercise.  Your back pain does not lessen 2 hours after you exercise. If you have any of these problems, stop doing the exercises. Do not do them again unless your doctor says it is okay. Get help right away if:  You have sudden, very bad back pain. If this happens, stop doing the exercises. Do not do them again unless your doctor says it is okay. This information is not intended to replace advice given to you by your health care provider. Make sure you discuss any questions you have with your health care provider. Document Released: 07/08/2010 Document Revised: 11/11/2015 Document Reviewed: 07/30/2014 Elsevier Interactive Patient Education  [employer].

## 2018-03-27 NOTE — Progress Notes (Signed)
Subjective:    Patient ID: Melanie Fisher, female    DOB: 1966-01-23, 52 y.o.   MRN: [id number]  HPI   Patient presents to clinic due to right sided back pain, muscle spasm.  States that is been happening for a few weeks, notices it a lot when has to push cart or lift something.  Denies any known injury to back.  States she has tried some Tylenol and heating pad with mild relief of symptoms, but not resolution.    Patient also is requesting refill of omeprazole, this medication is very helpful to control her heartburn symptoms.  Patient Active Problem List   Diagnosis Date Noted  . Psychogenic pruritus 04/07/2017  . Grief reaction 04/07/2017  . COPD exacerbation ([place]) 12/15/2016  . Hematoma and contusion 05/24/2016  . Insomnia due to anxiety and fear 04/10/2015  . Polyneuropathy 02/10/2015  . Cervical disc disorder with radiculopathy of cervical region 12/25/2014  . Essential hypertension 08/19/2014  . Slipped rib syndrome 03/23/2014  . Degenerative disc disease, thoracic 03/23/2014  . Nonallopathic lesion of thoracic region 03/23/2014  . Nonallopathic lesion-rib cage 03/23/2014  . Degenerative disk disease 02/12/2014  . Personal history of colonic polyps 02/11/2014  . Other chest pain 11/18/2013  . Obesity 07/01/2013  . Tobacco abuse counseling 03/15/2013  . Routine general medical examination at a health care facility 09/04/2012  . COPD (chronic obstructive pulmonary disease) ([place]) 05/26/2012  . Chronic shoulder pain 04/09/2012  . Generalized anxiety disorder 04/06/2012  . Tobacco abuse 04/06/2012  . Depression with anxiety   . Heart murmur   . Hyperlipidemia   . Allergy    Social History   Tobacco Use  . Smoking status: Current Every Day Smoker    Packs/day: 1.50    Years: 21.00    Pack years: 31.50    Types: Cigarettes  . Smokeless tobacco: Never Used  Substance Use Topics  . Alcohol use: Yes    Comment: occasionally   Review of  Systems  Constitutional: Negative for chills, fatigue and fever.  HENT: Negative for congestion, ear pain, sinus pain and sore throat.   Eyes: Negative.   Respiratory: Negative for cough, shortness of breath and wheezing.   Cardiovascular: Negative for chest pain, palpitations and leg swelling.  Gastrointestinal: Negative for abdominal pain, diarrhea, nausea and vomiting.  Genitourinary: Negative for dysuria, frequency and urgency.  Musculoskeletal: thoracic back pain/muscle spasm.  Skin: Negative for color change, pallor and rash.  Neurological: Negative for syncope, light-headedness and headaches.  Psychiatric/Behavioral: The patient is not nervous/anxious.       Objective:   Physical Exam  Constitutional: She is oriented to person, place, and time. She appears well-nourished. No distress.  HENT:  Head: Normocephalic and atraumatic.  Eyes: EOM are normal. No scleral icterus.  Neck: Neck supple. No tracheal deviation present.  Cardiovascular: Normal rate, regular rhythm and normal heart sounds.  Pulmonary/Chest: Effort normal and breath sounds normal. No respiratory distress. She has no wheezes. She has no rales.  Musculoskeletal: She exhibits no edema or deformity.       Arms: Area of thoracic spine tenderness indicated by red circle on diagram.  Muscles feel tight in the area.  Pain is reproducible with palpation.  Patient is able to bend forward and backward twist side to side raise arms above head, all these motions do cause a pulling type pain and back.  Neurological: She is alert and oriented to person, place, and time.  Skin: Skin is warm and  dry. No pallor.  Psychiatric: She has a normal mood and affect. Her behavior is normal.  Nursing note and vitals reviewed.     Vitals:   03/27/18 1129 03/27/18 1145  BP: (!) 166/98 (!) 152/90  Pulse: 94   Temp: 98.4 F (36.9 C)   SpO2: 95%     Assessment & Plan:   Muscle spasm of\acute right-sided thoracic back pain- patient  will take prednisone taper.  Patient will also use Flexeril as needed to control muscle spasms.  I am not giving patient NSAIDs due to her heart disease history.  We will also get CBC and CMP to rule out any anemia, electrolyte imbalances, check liver and kidney function.  Patient is concerned that the pain could be related to a kidney so lab work will help to rule this out and reassure patient.  GERD- omeprazole refill given.  This medication is very effective to control patient's symptoms.  Flu vaccine given in clinic today.  Follow-up in 6 months for management of chronic conditions.  Return to clinic sooner if current issues persist or worsen.

## 2018-03-28 ENCOUNTER — Encounter: Payer: Self-pay | Admitting: *Deleted

## 2018-03-28 ENCOUNTER — Encounter: Payer: Self-pay | Admitting: Family Medicine

## 2018-04-01 ENCOUNTER — Encounter: Payer: Self-pay | Admitting: Emergency Medicine

## 2018-04-01 ENCOUNTER — Other Ambulatory Visit: Payer: Self-pay

## 2018-04-01 ENCOUNTER — Emergency Department: Payer: BC Managed Care – PPO

## 2018-04-01 ENCOUNTER — Emergency Department
Admission: EM | Admit: 2018-04-01 | Discharge: 2018-04-01 | Disposition: A | Discharge status: Home / Self Care | Payer: BC Managed Care – PPO | Attending: Emergency Medicine | Admitting: Emergency Medicine

## 2018-04-01 DIAGNOSIS — F17210 Nicotine dependence, cigarettes, uncomplicated: Secondary | ICD-10-CM | POA: Diagnosis not present

## 2018-04-01 DIAGNOSIS — Z79899 Other long term (current) drug therapy: Secondary | ICD-10-CM | POA: Insufficient documentation

## 2018-04-01 DIAGNOSIS — R109 Unspecified abdominal pain: Secondary | ICD-10-CM

## 2018-04-01 DIAGNOSIS — I10 Essential (primary) hypertension: Secondary | ICD-10-CM | POA: Insufficient documentation

## 2018-04-01 DIAGNOSIS — J449 Chronic obstructive pulmonary disease, unspecified: Secondary | ICD-10-CM | POA: Diagnosis not present

## 2018-04-01 DIAGNOSIS — R1011 Right upper quadrant pain: Secondary | ICD-10-CM | POA: Insufficient documentation

## 2018-04-01 LAB — COMPREHENSIVE METABOLIC PANEL
ALT: 16 U/L (ref 0–44)
AST: 18 U/L (ref 15–41)
Albumin: 4.1 g/dL (ref 3.5–5.0)
Alkaline Phosphatase: 57 U/L (ref 38–126)
Anion gap: 9 (ref 5–15)
BUN: 17 mg/dL (ref 6–20)
CO2: 24 mmol/L (ref 22–32)
Calcium: 9 mg/dL (ref 8.9–10.3)
Chloride: 107 mmol/L (ref 98–111)
Creatinine, Ser: 0.9 mg/dL (ref 0.44–1.00)
GFR calc Af Amer: >60 mL/min (ref >60)
GFR calc non Af Amer: >60 mL/min (ref >60)
Glucose, Bld: 118 mg/dL — ABNORMAL HIGH (ref 70–99)
Potassium: 3.9 mmol/L (ref 3.5–5.1)
Sodium: 140 mmol/L (ref 135–145)
Total Bilirubin: 0.4 mg/dL (ref 0.3–1.2)
Total Protein: 7.6 g/dL (ref 6.5–8.1)

## 2018-04-01 LAB — URINALYSIS, COMPLETE (UACMP) WITH MICROSCOPIC
Bilirubin Urine: NEGATIVE
Glucose, UA: NEGATIVE mg/dL
Hgb urine dipstick: NEGATIVE
Ketones, ur: NEGATIVE mg/dL
Leukocytes, UA: NEGATIVE
Nitrite: NEGATIVE
Protein, ur: NEGATIVE mg/dL
Specific Gravity, Urine: 1.013 (ref 1.005–1.030)
WBC, UA: NONE SEEN WBC/hpf (ref 0–5)
pH: 5 (ref 5.0–8.0)

## 2018-04-01 LAB — CBC
HCT: 41.6 % (ref 36.0–46.0)
Hemoglobin: 14.3 g/dL (ref 12.0–15.0)
MCH: 33.2 pg (ref 26.0–34.0)
MCHC: 34.4 g/dL (ref 30.0–36.0)
MCV: 96.5 fL (ref 80.0–100.0)
Platelets: 196 10*3/uL (ref 150–400)
RBC: 4.31 MIL/uL (ref 3.87–5.11)
RDW: 12.1 % (ref 11.5–15.5)
WBC: 4.8 10*3/uL (ref 4.0–10.5)
nRBC: 0 % (ref 0.0–0.2)

## 2018-04-01 LAB — LIPASE, BLOOD: Lipase: 27 U/L (ref 11–51)

## 2018-04-01 MED ORDER — TRAMADOL HCL 50 MG PO TABS: 50.0000 mg | ORAL_TABLET | Freq: Four times a day (QID) | ORAL | 0 refills | Status: DC | PRN

## 2018-04-01 NOTE — ED Triage Notes (Signed)
R flank pain x 3 weeks. States radiates across back and to R lower abdomen. Seen last week and diagnosed as pulled muscle, taking muscle relaxer and prednisone without relief. Denies burning with urination.

## 2018-04-01 NOTE — ED Provider Notes (Signed)
[facility] Emergency Department Provider Note  Time seen: 7:35 AM  I have reviewed the triage vital signs and the nursing notes.   HISTORY  Chief Complaint Flank Pain    HPI Melanie Fisher is a 52 y.o. female with a past medical history of anxiety, asthma, depression, hyperlipidemia, presents to the emergency department for intermittent right flank pain x3 weeks.  According to the patient for the past 3 weeks she has been hurting in her right side radiating to her right back.  Patient was seen by her primary care doctor diagnosed with a possible pulled muscle was prescribed prednisone as well as a muscle relaxer.  Patient states no improvement with those medications.  Pain was worse today so she came to the emergency department for evaluation.  Denies any dysuria or hematuria, no history of kidney stones.  No vomiting, diarrhea, black or bloody stool.  Patient does state it seems to worsen after eating food.  Describes the pain currently is moderate dull pain in the right mid to upper abdomen radiating to the right back.   Past Medical History:  Diagnosis Date  . Allergy   . Anxiety   . Asthma   . Depression   . Heart murmur   . Hyperlipidemia   . Sleep apnea   . Tobacco abuse     Patient Active Problem List   Diagnosis Date Noted  . Psychogenic pruritus 04/07/2017  . Grief reaction 04/07/2017  . COPD exacerbation ([place]) 12/15/2016  . Hematoma and contusion 05/24/2016  . Insomnia due to anxiety and fear 04/10/2015  . Polyneuropathy 02/10/2015  . Cervical disc disorder with radiculopathy of cervical region 12/25/2014  . Essential hypertension 08/19/2014  . Slipped rib syndrome 03/23/2014  . Degenerative disc disease, thoracic 03/23/2014  . Nonallopathic lesion of thoracic region 03/23/2014  . Nonallopathic lesion-rib cage 03/23/2014  . Degenerative disk disease 02/12/2014  . Personal history of colonic polyps 02/11/2014  . Other chest pain  11/18/2013  . Obesity 07/01/2013  . Tobacco abuse counseling 03/15/2013  . Routine general medical examination at a health care facility 09/04/2012  . COPD (chronic obstructive pulmonary disease) ([place]) 05/26/2012  . Chronic shoulder pain 04/09/2012  . Generalized anxiety disorder 04/06/2012  . Tobacco abuse 04/06/2012  . Depression with anxiety   . Heart murmur   . Hyperlipidemia   . Allergy     Past Surgical History:  Procedure Laterality Date  . ABDOMINAL HYSTERECTOMY  2008   partial  . right toe bunionectomy    . right wrist ganglion cyst removal      Prior to Admission medications   Medication Sig Start Date End Date Taking? Authorizing Provider  citalopram (CELEXA) 20 MG tablet Take 1 tablet (20 mg total) by mouth daily. 05/23/16   Crecencio Mc, MD  cyclobenzaprine (FLEXERIL) 5 MG tablet Take 1 tablet (5 mg total) by mouth 3 (three) times daily as needed for muscle spasms. 03/27/18   Jodelle Green, FNP  isosorbide mononitrate (IMDUR) 30 MG 24 hr tablet Take 1 tablet (30 mg total) by mouth daily. 05/23/16   Crecencio Mc, MD  isosorbide mononitrate (IMDUR) 30 MG 24 hr tablet TAKE 1 TABLET BY MOUTH ONCE DAILY 02/07/18   Crecencio Mc, MD  metoprolol succinate (TOPROL-XL) 25 MG 24 hr tablet TAKE 1 TABLET BY MOUTH ONCE DAILY AT BEDTIME FOR BLOOD PRESSURE/HEADACHE PREVENTION 03/26/18   Crecencio Mc, MD  omeprazole (PRILOSEC) 40 MG capsule Take 1 capsule (40  mg total) by mouth daily. 03/27/18   Guse, Jacquelynn Cree, FNP  predniSONE (STERAPRED UNI-PAK 21 TAB) 10 MG (21) TBPK tablet Take according to pack instructions 03/27/18   Jodelle Green, FNP    No Known Allergies  Family History  Problem Relation Age of Onset  . Kidney disease Mother 25       ESKD, partial neprhectomy  . COPD Mother   . Heart disease Father        CABG at age 77  . Alcohol abuse Father        until late 72  . Diabetes Father   . Hypertension Father   . Diabetes Sister   . Cancer Sister        uterine  ca  . Cancer Maternal Uncle 60       stage 4 colon CA  . Colon cancer Maternal Uncle   . Esophageal cancer Neg Hx   . Pancreatic cancer Neg Hx   . Rectal cancer Neg Hx   . Stomach cancer Neg Hx     Social History Social History   Tobacco Use  . Smoking status: Current Every Day Smoker    Packs/day: 0.50    Years: 21.00    Pack years: 10.50    Types: Cigarettes  . Smokeless tobacco: Never Used  Substance Use Topics  . Alcohol use: Yes    Comment: occasionally  . Drug use: No    Review of Systems Constitutional: Negative for fever. Cardiovascular: Negative for chest pain. Respiratory: Negative for shortness of breath. Gastrointestinal: Right upper quadrant/right back pain.  Negative nausea vomiting diarrhea Genitourinary: Negative for urinary compaints Musculoskeletal: Right mid back pain. Skin: Negative for skin complaints  Neurological: Negative for headache All other ROS negative  ____________________________________________   PHYSICAL EXAM:  VITAL SIGNS: ED Triage Vitals [04/01/18 0723]  Enc Vitals Group     BP (!) 162/87     Pulse Rate 87     Resp 20     Temp 98.3 F (36.8 C)     Temp Source Oral     SpO2 100 %     Weight 193 lb (87.5 kg)     Height 5\' 3"  (1.6 m)     Head Circumference      Peak Flow      Pain Score 8     Pain Loc      Pain Edu?      Excl. in [place]?    Constitutional: Alert and oriented. Well appearing and in no distress. Eyes: Normal exam ENT   Head: Normocephalic and atraumatic.   Mouth/Throat: Mucous membranes are moist. Cardiovascular: Normal rate, regular rhythm. No murmur Respiratory: Normal respiratory effort without tachypnea nor retractions. Breath sounds are clear  Gastrointestinal: Soft, mild right upper quadrant tenderness palpation, no rebound guarding or distention.  Abdomen otherwise benign. Musculoskeletal: Nontender with normal range of motion in all extremities.  Neurologic:  Normal speech and language. No  gross focal neurologic deficits  Skin:  Skin is warm, dry and intact.  Psychiatric: Mood and affect are normal.  ____________________________________________   RADIOLOGY  Sound negative CT negative  ____________________________________________   INITIAL IMPRESSION / ASSESSMENT AND PLAN / ED COURSE  Pertinent labs & imaging results that were available during my care of the patient were reviewed by me and considered in my medical decision making (see chart for details).  Patient presents to the emergency department for right-sided abdominal pain intermittent over the past 3 weeks.  Differential would include biliary colic, cholecystitis, ureterolithiasis, UTI/pyelonephritis, less likely appendicitis or colitis.  We will check labs, obtain right upper quadrant ultrasound to further evaluate.  Patient does not wish for any pain medication at this time as she drove.  Patient's labs are largely within normal limits including LFTs.  White blood cell count is normal.  Urinalysis is normal.  CT scan of the abdomen/pelvis is negative.  Ultrasound is normal.  We will discharge with a short course of pain medication have the patient follow-up with her primary care doctor for further evaluation.  Patient agreeable to plan of care. ____________________________________________   FINAL CLINICAL IMPRESSION(S) / ED DIAGNOSES  Right upper quadrant abdominal pain    Harvest Dark, MD 04/01/18 1052

## 2018-04-02 ENCOUNTER — Telehealth: Payer: Self-pay | Admitting: *Deleted

## 2018-04-02 NOTE — Telephone Encounter (Signed)
yes

## 2018-04-02 NOTE — Telephone Encounter (Signed)
4:30 slot for tomorrow was no longer available pt has been scheduled for Friday at 8am. Pt is aware of appt date and time.

## 2018-04-02 NOTE — Telephone Encounter (Signed)
Would it be okay to schedule pt in the 4:30 same day slot that is available tomorrow for an ED follow up?

## 2018-04-02 NOTE — Telephone Encounter (Signed)
Copied from [place] [id number]. Topic: General - Other >> Apr 01, 2018  5:44 PM Valla Leaver wrote: Reason for CRM: Patient needs to be worked in for ED f/u with Dr. Derrel Nip. Put out of work by ED doctor until Wednesday the 16th. Please advise.

## 2018-04-05 ENCOUNTER — Ambulatory Visit (INDEPENDENT_AMBULATORY_CARE_PROVIDER_SITE_OTHER): Payer: BC Managed Care – PPO

## 2018-04-05 ENCOUNTER — Encounter: Payer: Self-pay | Admitting: Internal Medicine

## 2018-04-05 ENCOUNTER — Ambulatory Visit: Payer: BC Managed Care – PPO | Admitting: Internal Medicine

## 2018-04-05 VITALS — BP 138/86 | HR 93 | Temp 98.2°F | Resp 15 | Ht 63.0 in | Wt 190.8 lb

## 2018-04-05 DIAGNOSIS — M546 Pain in thoracic spine: Secondary | ICD-10-CM | POA: Diagnosis not present

## 2018-04-05 DIAGNOSIS — M5134 Other intervertebral disc degeneration, thoracic region: Secondary | ICD-10-CM

## 2018-04-05 DIAGNOSIS — I10 Essential (primary) hypertension: Secondary | ICD-10-CM | POA: Diagnosis not present

## 2018-04-05 MED ORDER — TIZANIDINE HCL 4 MG PO CAPS: 4.0000 mg | ORAL_CAPSULE | Freq: Three times a day (TID) | ORAL | 2 refills | Status: DC

## 2018-04-05 MED ORDER — CELECOXIB 200 MG PO CAPS: 200.0000 mg | ORAL_CAPSULE | Freq: Every day | ORAL | 2 refills | Status: DC

## 2018-04-05 MED ORDER — METOPROLOL SUCCINATE ER 50 MG PO TB24: 50.0000 mg | ORAL_TABLET | Freq: Every day | ORAL | 2 refills | Status: DC

## 2018-04-05 MED ORDER — TRAMADOL HCL 50 MG PO TABS: 50.0000 mg | ORAL_TABLET | Freq: Two times a day (BID) | ORAL | 0 refills | Status: DC

## 2018-04-05 NOTE — Patient Instructions (Signed)
celebrex 200 mg once daily for 30 days  You can add up to 2000 mg of acetominophen (tylenol) every day safely  In divided doses (500 mg every 6 hours  Or 1000 mg every 12 hours.)  Save the tramadol for nighttime  PT referral in progress   Increase your metoprolol dose to 50 mg once daily for blood pressure

## 2018-04-05 NOTE — Progress Notes (Signed)
Subjective:  Patient ID: Melanie Fisher, female    DOB: 02-01-1966  Age: 52 y.o. MRN: [id number]  CC: The primary encounter diagnosis was Degenerative disc disease, thoracic. Diagnoses of Acute right-sided thoracic back pain and Essential hypertension were also pertinent to this visit.  HPI Melanie Fisher presents for Er follow up or back pain   And hypertension   Thoracic back pain has been present  Since Sept 26.   Pain occurred suddenly,  No history of injury but lifts boxes and pans daily a at work  weighing up to 20 lbs , along with  Sweeping and pushing a light cart at work.  Pain is aggravated by bending from the waist,  Reaching out with right arm.  Also hurts to lie on right side.  Was treated on Oct 9 by NP with prednisone taper and flexeril  .  No improvement , so she went to ER for same on Oct 14, now described as flank pain and RUQ pain.  CT renal stone study done and RUQ US done ,  Both normal ,  Given short supply of  Tramadol .  States that her pain is unchanged.  No   Prior films done      Outpatient Medications Prior to Visit  Medication Sig Dispense Refill  . citalopram (CELEXA) 20 MG tablet Take 1 tablet (20 mg total) by mouth daily. 90 tablet 0  . cyclobenzaprine (FLEXERIL) 5 MG tablet Take 1 tablet (5 mg total) by mouth 3 (three) times daily as needed for muscle spasms. 30 tablet 1  . isosorbide mononitrate (IMDUR) 30 MG 24 hr tablet TAKE 1 TABLET BY MOUTH ONCE DAILY (Patient taking differently: Take 30 mg by mouth daily. ) 90 tablet 0  . omeprazole (PRILOSEC) 40 MG capsule Take 1 capsule (40 mg total) by mouth daily. (Patient taking differently: Take 40 mg by mouth at bedtime. ) 90 capsule 1  . traMADol (ULTRAM) 50 MG tablet Take 1 tablet (50 mg total) by mouth every 6 (six) hours as needed. 15 tablet 0  . metoprolol succinate (TOPROL-XL) 25 MG 24 hr tablet TAKE 1 TABLET BY MOUTH ONCE DAILY AT BEDTIME FOR BLOOD PRESSURE/HEADACHE PREVENTION (Patient taking  differently: Take 25 mg by mouth at bedtime. ) 30 tablet 0  . predniSONE (STERAPRED UNI-PAK 21 TAB) 10 MG (21) TBPK tablet Take according to pack instructions (Patient not taking: Reported on 04/05/2018) 21 tablet 0   No facility-administered medications prior to visit.     Review of Systems;  Patient denies headache, fevers, malaise, unintentional weight loss, skin rash, eye pain, sinus congestion and sinus pain, sore throat, dysphagia,  hemoptysis , cough, dyspnea, wheezing, chest pain, palpitations, orthopnea, edema, abdominal pain, nausea, melena, diarrhea, constipation, flank pain, dysuria, hematuria, urinary  Frequency, nocturia, numbness, tingling, seizures,  Focal weakness, Loss of consciousness,  Tremor, insomnia, depression, anxiety, and suicidal ideation.      Objective:  BP 138/86 (BP Location: Left Arm, Patient Position: Sitting, Cuff Size: Normal)   Pulse 93   Temp 98.2 F (36.8 C) (Oral)   Resp 15   Ht 5\' 3"  (1.6 m)   Wt 190 lb 12.8 oz (86.5 kg)   SpO2 98%   BMI 33.80 kg/m   BP Readings from Last 3 Encounters:  04/05/18 138/86  04/01/18 (!) 162/87  03/27/18 (!) 152/90    Wt Readings from Last 3 Encounters:  04/05/18 190 lb 12.8 oz (86.5 kg)  04/01/18 193 lb (87.5 kg)  03/27/18 195 lb 3.2 oz (88.5 kg)    General appearance: alert, cooperative and appears stated age Ears: normal TM's and external ear canals both ears Throat: lips, mucosa, and tongue normal; teeth and gums normal Neck: no adenopathy, no carotid bruit, supple, symmetrical, trachea midline and thyroid not enlarged, symmetric, no tenderness/mass/nodules Back: symmetric, no curvature. ROM normal. No CVA tenderness. Lungs: clear to auscultation bilaterally Heart: regular rate and rhythm, S1, S2 normal, no murmur, click, rub or gallop Abdomen: soft, non-tender; bowel sounds normal; no masses,  no organomegaly Pulses: 2+ and symmetric Skin: Skin color, texture, turgor normal. No rashes or  lesions Lymph nodes: Cervical, supraclavicular, and axillary nodes normal.  Lab Results  Component Value Date   HGBA1C 5.6 05/02/2017   HGBA1C 5.4 05/29/2016   HGBA1C 5.4 04/09/2015    Lab Results  Component Value Date   CREATININE 0.90 04/01/2018   CREATININE 0.86 03/27/2018   CREATININE 0.73 04/04/2017    Lab Results  Component Value Date   WBC 4.8 04/01/2018   HGB 14.3 04/01/2018   HCT 41.6 04/01/2018   PLT 196 04/01/2018   GLUCOSE 118 (H) 04/01/2018   CHOL 168 04/09/2015   TRIG 64 04/09/2015   HDL 62 04/09/2015   LDLDIRECT 131.0 05/23/2016   LDLCALC 93 04/09/2015   ALT 16 04/01/2018   AST 18 04/01/2018   NA 140 04/01/2018   K 3.9 04/01/2018   CL 107 04/01/2018   CREATININE 0.90 04/01/2018   BUN 17 04/01/2018   CO2 24 04/01/2018   TSH 2.92 04/04/2017   HGBA1C 5.6 05/02/2017    Ct Renal Stone Study  Result Date: 04/01/2018 CLINICAL DATA:  Right flank pain for 3 weeks. EXAM: CT ABDOMEN AND PELVIS WITHOUT CONTRAST TECHNIQUE: Multidetector CT imaging of the abdomen and pelvis was performed following the standard protocol without IV contrast. COMPARISON:  Right upper quadrant abdominal ultrasound 04/01/2018. CT abdomen and pelvis 01/13/2014. FINDINGS: Lower chest: Subpleural bibasilar lung opacities, most likely atelectasis. No pleural effusion. Hepatobiliary: No focal liver abnormality is seen. No gallstones, gallbladder wall thickening, or biliary dilatation. Pancreas: Unremarkable. Spleen: Unremarkable. Adrenals/Urinary Tract: Unremarkable adrenal glands. No evidence of renal mass on this unenhanced study. No urolithiasis or hydroureteronephrosis. Unremarkable bladder. Stomach/Bowel: The stomach is within normal limits. There is no evidence of bowel obstruction or gross bowel wall thickening. The appendix is unremarkable. Vascular/Lymphatic: Normal caliber of the abdominal aorta. No enlarged or suspicious lymph nodes. 8 mm short axis lymph node medial to the left adrenal  gland, unchanged. Reproductive: Status post hysterectomy. Unremarkable right ovary. The left ovary is not clearly identified and may also be absent. Other: No intraperitoneal free fluid. Musculoskeletal: No suspicious osseous lesion. Lower lumbar facet arthrosis with grade 1 anterolisthesis of L5 on S1. IMPRESSION: No urolithiasis, obstructive uropathy, or other acute abnormality identified in the abdomen or pelvis. Electronically Signed   By: Logan Bores M.D.   On: 04/01/2018 10:25   US Abdomen Limited Ruq  Result Date: 04/01/2018 CLINICAL DATA:  Right upper quadrant abdominal pain. EXAM: ULTRASOUND ABDOMEN LIMITED RIGHT UPPER QUADRANT COMPARISON:  Abdominal ultrasound on 11/24/2013 and CT of the abdomen on 01/13/2014. FINDINGS: Gallbladder: No gallstones or wall thickening visualized. No sonographic Murphy sign noted by sonographer. Common bile duct: Diameter: 4 mm. Liver: No focal lesion identified. Within normal limits in parenchymal echogenicity. Portal vein is patent on color Doppler imaging with normal direction of blood flow towards the liver. IMPRESSION: Normal right upper quadrant abdominal ultrasound. Electronically Signed  By: Aletta Edouard M.D.   On: 04/01/2018 09:05    Assessment & Plan:   Problem List Items Addressed This Visit    Degenerative disc disease, thoracic - Primary    Repeat plain films done were negative for fractures,  But did note  degenerative changes at multiple levels.   Daily NSAID prescribed, tramadol refilled,  Muscle relaxer changed to Zanaflex  and referral for PT  In process.        Relevant Medications   celecoxib (CELEBREX) 200 MG capsule   tiZANidine (ZANAFLEX) 4 MG capsule   traMADol (ULTRAM) 50 MG tablet   Other Relevant Orders   DG Thoracic Spine W/Swimmers (Completed)   Ambulatory referral to Physical Therapy   Essential hypertension    Not at goal.  Increase metoprolol succinate dose to 50 mg       Relevant Medications   metoprolol  succinate (TOPROL-XL) 50 MG 24 hr tablet    Other Visit Diagnoses    Acute right-sided thoracic back pain       Relevant Medications   celecoxib (CELEBREX) 200 MG capsule   tiZANidine (ZANAFLEX) 4 MG capsule   traMADol (ULTRAM) 50 MG tablet   Other Relevant Orders   DG Thoracic Spine W/Swimmers (Completed)   Ambulatory referral to Physical Therapy    A total of 25 minutes of face to face time was spent with patient more than half of which was spent in counselling about the above mentioned conditions  and coordination of care   I have discontinued Mardene Celeste L. Huffines's predniSONE. I am also having her start on celecoxib, tiZANidine, and traMADol. Additionally, I am having her maintain her citalopram, isosorbide mononitrate, cyclobenzaprine, omeprazole, traMADol, and metoprolol succinate.  Meds ordered this encounter  Medications  . celecoxib (CELEBREX) 200 MG capsule    Sig: Take 1 capsule (200 mg total) by mouth daily.    Dispense:  30 capsule    Refill:  2  . tiZANidine (ZANAFLEX) 4 MG capsule    Sig: Take 1 capsule (4 mg total) by mouth 3 (three) times daily.    Dispense:  30 capsule    Refill:  2  . traMADol (ULTRAM) 50 MG tablet    Sig: Take 1 tablet (50 mg total) by mouth 2 (two) times daily.    Dispense:  60 tablet    Refill:  0  . DISCONTD: metoprolol succinate (TOPROL-XL) 50 MG 24 hr tablet    Sig: Take 1 tablet (50 mg total) by mouth daily.    Dispense:  30 tablet    Refill:  2    Due for office visit  . metoprolol succinate (TOPROL-XL) 50 MG 24 hr tablet    Sig: Take 1 tablet (50 mg total) by mouth daily.    Dispense:  30 tablet    Refill:  2    Medications Discontinued During This Encounter  Medication Reason  . predniSONE (STERAPRED UNI-PAK 21 TAB) 10 MG (21) TBPK tablet Completed Course  . metoprolol succinate (TOPROL-XL) 25 MG 24 hr tablet   . metoprolol succinate (TOPROL-XL) 50 MG 24 hr tablet Reorder    Follow-up: Return in about 4 weeks (around  05/03/2018).   Crecencio Mc, MD

## 2018-04-07 NOTE — Assessment & Plan Note (Signed)
Not at goal.  Increase metoprolol succinate dose to 50 mg

## 2018-04-07 NOTE — Assessment & Plan Note (Addendum)
Repeat plain films done were negative for fractures,  But did note  degenerative changes at multiple levels.   Daily NSAID prescribed, tramadol refilled,  Muscle relaxer changed to Zanaflex  and referral for PT  In process.

## 2018-05-20 ENCOUNTER — Ambulatory Visit (INDEPENDENT_AMBULATORY_CARE_PROVIDER_SITE_OTHER): Payer: BC Managed Care – PPO | Admitting: Internal Medicine

## 2018-05-20 ENCOUNTER — Encounter: Payer: Self-pay | Admitting: Internal Medicine

## 2018-05-20 ENCOUNTER — Other Ambulatory Visit (HOSPITAL_COMMUNITY)
Admission: RE | Admit: 2018-05-20 | Discharge: 2018-05-20 | Disposition: A | Discharge status: Home / Self Care | Payer: BC Managed Care – PPO | Source: Ambulatory Visit | Attending: Internal Medicine | Admitting: Internal Medicine

## 2018-05-20 VITALS — BP 130/82 | HR 98 | Temp 98.1°F | Resp 15 | Ht 63.0 in | Wt 192.4 lb

## 2018-05-20 DIAGNOSIS — E785 Hyperlipidemia, unspecified: Secondary | ICD-10-CM

## 2018-05-20 DIAGNOSIS — Z0000 Encounter for general adult medical examination without abnormal findings: Secondary | ICD-10-CM

## 2018-05-20 DIAGNOSIS — Z6834 Body mass index (BMI) 34.0-34.9, adult: Secondary | ICD-10-CM

## 2018-05-20 DIAGNOSIS — Z634 Disappearance and death of family member: Secondary | ICD-10-CM

## 2018-05-20 DIAGNOSIS — R7301 Impaired fasting glucose: Secondary | ICD-10-CM | POA: Diagnosis not present

## 2018-05-20 DIAGNOSIS — Z124 Encounter for screening for malignant neoplasm of cervix: Secondary | ICD-10-CM | POA: Insufficient documentation

## 2018-05-20 DIAGNOSIS — Z716 Tobacco abuse counseling: Secondary | ICD-10-CM

## 2018-05-20 DIAGNOSIS — D126 Benign neoplasm of colon, unspecified: Secondary | ICD-10-CM

## 2018-05-20 DIAGNOSIS — Z1231 Encounter for screening mammogram for malignant neoplasm of breast: Secondary | ICD-10-CM | POA: Diagnosis not present

## 2018-05-20 DIAGNOSIS — E6609 Other obesity due to excess calories: Secondary | ICD-10-CM

## 2018-05-20 DIAGNOSIS — E538 Deficiency of other specified B group vitamins: Secondary | ICD-10-CM | POA: Diagnosis not present

## 2018-05-20 DIAGNOSIS — R21 Rash and other nonspecific skin eruption: Secondary | ICD-10-CM | POA: Diagnosis not present

## 2018-05-20 DIAGNOSIS — F418 Other specified anxiety disorders: Secondary | ICD-10-CM

## 2018-05-20 NOTE — Patient Instructions (Signed)
Your annual mammogram has been ordered.  You are encouraged (required) to call to make your appointment at [facility]  Referral to [facility] is in process   Health Maintenance for Postmenopausal Women Menopause is a normal process in which your reproductive ability comes to an end. This process happens gradually over a span of months to years, usually between the ages of 74 and 5. Menopause is complete when you have missed 12 consecutive menstrual periods. It is important to talk with your health care provider about some of the most common conditions that affect postmenopausal women, such as heart disease, cancer, and bone loss (osteoporosis). Adopting a healthy lifestyle and getting preventive care can help to promote your health and wellness. Those actions can also lower your chances of developing some of these common conditions. What should I know about menopause? During menopause, you may experience a number of symptoms, such as:  Moderate-to-severe hot flashes.  Night sweats.  Decrease in sex drive.  Mood swings.  Headaches.  Tiredness.  Irritability.  Memory problems.  Insomnia.  Choosing to treat or not to treat menopausal changes is an individual decision that you make with your health care provider. What should I know about hormone replacement therapy and supplements? Hormone therapy products are effective for treating symptoms that are associated with menopause, such as hot flashes and night sweats. Hormone replacement carries certain risks, especially as you become older. If you are thinking about using estrogen or estrogen with progestin treatments, discuss the benefits and risks with your health care provider. What should I know about heart disease and stroke? Heart disease, heart attack, and stroke become more likely as you age. This may be due, in part, to the hormonal changes that your body experiences during menopause. These can affect how  your body processes dietary fats, triglycerides, and cholesterol. Heart attack and stroke are both medical emergencies. There are many things that you can do to help prevent heart disease and stroke:  Have your blood pressure checked at least every 1-2 years. High blood pressure causes heart disease and increases the risk of stroke.  If you are 71-45 years old, ask your health care provider if you should take aspirin to prevent a heart attack or a stroke.  Do not use any tobacco products, including cigarettes, chewing tobacco, or electronic cigarettes. If you need help quitting, ask your health care provider.  It is important to eat a healthy diet and maintain a healthy weight. ? Be sure to include plenty of vegetables, fruits, low-fat dairy products, and lean protein. ? Avoid eating foods that are high in solid fats, added sugars, or salt (sodium).  Get regular exercise. This is one of the most important things that you can do for your health. ? Try to exercise for at least 150 minutes each week. The type of exercise that you do should increase your heart rate and make you sweat. This is known as moderate-intensity exercise. ? Try to do strengthening exercises at least twice each week. Do these in addition to the moderate-intensity exercise.  Know your numbers.Ask your health care provider to check your cholesterol and your blood glucose. Continue to have your blood tested as directed by your health care provider.  What should I know about cancer screening? There are several types of cancer. Take the following steps to reduce your risk and to catch any cancer development as early as possible. Breast Cancer  Practice breast self-awareness. ? This means understanding how  your breasts normally appear and feel. ? It also means doing regular breast self-exams. Let your health care provider know about any changes, no matter how small.  If you are 41 or older, have a clinician do a breast exam  (clinical breast exam or CBE) every year. Depending on your age, family history, and medical history, it may be recommended that you also have a yearly breast X-ray (mammogram).  If you have a family history of breast cancer, talk with your health care provider about genetic screening.  If you are at high risk for breast cancer, talk with your health care provider about having an MRI and a mammogram every year.  Breast cancer (BRCA) gene test is recommended for women who have family members with BRCA-related cancers. Results of the assessment will determine the need for genetic counseling and BRCA1 and for BRCA2 testing. BRCA-related cancers include these types: ? Breast. This occurs in males or females. ? Ovarian. ? Tubal. This may also be called fallopian tube cancer. ? Cancer of the abdominal or pelvic lining (peritoneal cancer). ? Prostate. ? Pancreatic.  Cervical, Uterine, and Ovarian Cancer Your health care provider may recommend that you be screened regularly for cancer of the pelvic organs. These include your ovaries, uterus, and vagina. This screening involves a pelvic exam, which includes checking for microscopic changes to the surface of your cervix (Pap test).  For women ages 21-65, health care providers may recommend a pelvic exam and a Pap test every three years. For women ages 44-65, they may recommend the Pap test and pelvic exam, combined with testing for human papilloma virus (HPV), every five years. Some types of HPV increase your risk of cervical cancer. Testing for HPV may also be done on women of any age who have unclear Pap test results.  Other health care providers may not recommend any screening for nonpregnant women who are considered low risk for pelvic cancer and have no symptoms. Ask your health care provider if a screening pelvic exam is right for you.  If you have had past treatment for cervical cancer or a condition that could lead to cancer, you need Pap tests  and screening for cancer for at least 20 years after your treatment. If Pap tests have been discontinued for you, your risk factors (such as having a new sexual partner) need to be reassessed to determine if you should start having screenings again. Some women have medical problems that increase the chance of getting cervical cancer. In these cases, your health care provider may recommend that you have screening and Pap tests more often.  If you have a family history of uterine cancer or ovarian cancer, talk with your health care provider about genetic screening.  If you have vaginal bleeding after reaching menopause, tell your health care provider.  There are currently no reliable tests available to screen for ovarian cancer.  Lung Cancer Lung cancer screening is recommended for adults 1-38 years old who are at high risk for lung cancer because of a history of smoking. A yearly low-dose CT scan of the lungs is recommended if you:  Currently smoke.  Have a history of at least 30 pack-years of smoking and you currently smoke or have quit within the past 15 years. A pack-year is smoking an average of one pack of cigarettes per day for one year.  Yearly screening should:  Continue until it has been 15 years since you quit.  Stop if you develop a health problem that  would prevent you from having lung cancer treatment.  Colorectal Cancer  This type of cancer can be detected and can often be prevented.  Routine colorectal cancer screening usually begins at age 79 and continues through age 2.  If you have risk factors for colon cancer, your health care provider may recommend that you be screened at an earlier age.  If you have a family history of colorectal cancer, talk with your health care provider about genetic screening.  Your health care provider may also recommend using home test kits to check for hidden blood in your stool.  A small camera at the end of a tube can be used to  examine your colon directly (sigmoidoscopy or colonoscopy). This is done to check for the earliest forms of colorectal cancer.  Direct examination of the colon should be repeated every 5-10 years until age 61. However, if early forms of precancerous polyps or small growths are found or if you have a family history or genetic risk for colorectal cancer, you may need to be screened more often.  Skin Cancer  Check your skin from head to toe regularly.  Monitor any moles. Be sure to tell your health care provider: ? About any new moles or changes in moles, especially if there is a change in a mole's shape or color. ? If you have a mole that is larger than the size of a pencil eraser.  If any of your family members has a history of skin cancer, especially at a young age, talk with your health care provider about genetic screening.  Always use sunscreen. Apply sunscreen liberally and repeatedly throughout the day.  Whenever you are outside, protect yourself by wearing long sleeves, pants, a wide-brimmed hat, and sunglasses.  What should I know about osteoporosis? Osteoporosis is a condition in which bone destruction happens more quickly than new bone creation. After menopause, you may be at an increased risk for osteoporosis. To help prevent osteoporosis or the bone fractures that can happen because of osteoporosis, the following is recommended:  If you are 47-71 years old, get at least 1,000 mg of calcium and at least 600 mg of vitamin D per day.  If you are older than age 74 but younger than age 44, get at least 1,200 mg of calcium and at least 600 mg of vitamin D per day.  If you are older than age 53, get at least 1,200 mg of calcium and at least 800 mg of vitamin D per day.  Smoking and excessive alcohol intake increase the risk of osteoporosis. Eat foods that are rich in calcium and vitamin D, and do weight-bearing exercises several times each week as directed by your health care  provider. What should I know about how menopause affects my mental health? Depression may occur at any age, but it is more common as you become older. Common symptoms of depression include:  Low or sad mood.  Changes in sleep patterns.  Changes in appetite or eating patterns.  Feeling an overall lack of motivation or enjoyment of activities that you previously enjoyed.  Frequent crying spells.  Talk with your health care provider if you think that you are experiencing depression. What should I know about immunizations? It is important that you get and maintain your immunizations. These include:  Tetanus, diphtheria, and pertussis (Tdap) booster vaccine.  Influenza every year before the flu season begins.  Pneumonia vaccine.  Shingles vaccine.  Your health care provider may also recommend other immunizations.  This information is not intended to replace advice given to you by your health care provider. Make sure you discuss any questions you have with your health care provider. Document Released: 07/28/2005 Document Revised: 12/24/2015 Document Reviewed: 03/09/2015 Elsevier Interactive Patient Education  2018 [employer].

## 2018-05-20 NOTE — Progress Notes (Signed)
Patient ID: Melanie Fisher, female    DOB: 23-Jun-1965  Age: 52 y.o. MRN: [id number]  The patient is here for annual preventive examination and management of other chronic and acute problems.  Health maintenance reviewed:  She is overdue for for colon cancer screening and mammogram PLast PAP 5 years ago,  Has been fasting 5 hours   The risk factors are reflected in the social history.  The roster of all physicians providing medical care to patient - is listed in the Snapshot section of the chart.  Activities of daily living:  The patient is 100% independent in all ADLs: dressing, toileting, feeding as well as independent mobility  Home safety : The patient has smoke detectors in the home. They wear seatbelts.  There are no firearms at home. There is no violence in the home.   There is no risks for hepatitis, STDs or HIV. There is no   history of blood transfusion. They have no travel history to infectious disease endemic areas of the world.  The patient has seen their dentist in the last six month. They have not seen their eye doctor in the last year. They deny any  hearing difficulty with regard to whispered voices and some television programs.  They have deferred audiologic testing in the last year.  They do not  have excessive sun exposure. Discussed the need for sun protection: hats, long sleeves and use of sunscreen if there is significant sun exposure.   Diet: the importance of a healthy diet is discussed. They do have a healthy diet.  The benefits of regular aerobic exercise were discussed. She does not exercise regularly .   Depression screen: there are no signs or vegative symptoms of depression- irritability, change in appetite, anhedonia, sadness/tearfullness.  The following portions of the patient's history were reviewed and updated as appropriate: allergies, current medications, past family history, past medical history,  past surgical history, past social history  and  problem list.  Visual acuity was not assessed per patient preference . Hearing and body mass index were assessed and reviewed.   During the course of the visit the patient was educated and counseled about appropriate screening and preventive services including : fall prevention , diabetes screening, nutrition counseling, colorectal cancer screening, and recommended immunizations.    CC: The primary encounter diagnosis was Cervical cancer screening. Diagnoses of B12 deficiency, Hyperlipidemia LDL goal <130, Breast cancer screening by mammogram, Rash of unknown cause, Impaired fasting glucose, Depression with anxiety, Routine general medical examination at a health care facility, Widowed, Class 1 obesity due to excess calories without serious comorbidity with body mass index (BMI) of 34.0 to 34.9 in adult, Tubular adenoma of colon, and Tobacco abuse counseling were also pertinent to this visit.   History Niaya has a past medical history of Allergy, Anxiety, Asthma, Depression, Heart murmur, Hyperlipidemia, Sleep apnea, and Tobacco abuse.   She has a past surgical history that includes Abdominal hysterectomy (2008); right toe bunionectomy; and right wrist ganglion cyst removal.   Her family history includes Alcohol abuse in her father; COPD in her mother; Cancer in her sister; Cancer (age of onset: 51) in her maternal uncle; Colon cancer in her maternal uncle; Diabetes in her father and sister; Heart disease in her father; Hypertension in her father; Kidney disease (age of onset: 15) in her mother.She reports that she has been smoking cigarettes. She has a 10.50 pack-year smoking history. She has never used smokeless tobacco. She reports that she drinks alcohol.  She reports that she does not use drugs.  Outpatient Medications Prior to Visit  Medication Sig Dispense Refill  . celecoxib (CELEBREX) 200 MG capsule Take 1 capsule (200 mg total) by mouth daily. 30 capsule 2  . citalopram (CELEXA) 20 MG  tablet Take 1 tablet (20 mg total) by mouth daily. 90 tablet 0  . cyclobenzaprine (FLEXERIL) 5 MG tablet Take 1 tablet (5 mg total) by mouth 3 (three) times daily as needed for muscle spasms. 30 tablet 1  . isosorbide mononitrate (IMDUR) 30 MG 24 hr tablet TAKE 1 TABLET BY MOUTH ONCE DAILY (Patient taking differently: Take 30 mg by mouth daily. ) 90 tablet 0  . metoprolol succinate (TOPROL-XL) 50 MG 24 hr tablet Take 1 tablet (50 mg total) by mouth daily. 30 tablet 2  . omeprazole (PRILOSEC) 40 MG capsule Take 1 capsule (40 mg total) by mouth daily. (Patient taking differently: Take 40 mg by mouth at bedtime. ) 90 capsule 1  . tiZANidine (ZANAFLEX) 4 MG capsule Take 1 capsule (4 mg total) by mouth 3 (three) times daily. 30 capsule 2  . traMADol (ULTRAM) 50 MG tablet Take 1 tablet (50 mg total) by mouth every 6 (six) hours as needed. 15 tablet 0  . traMADol (ULTRAM) 50 MG tablet Take 1 tablet (50 mg total) by mouth 2 (two) times daily. 60 tablet 0   No facility-administered medications prior to visit.     Review of Systems  Patient denies headache, fevers, malaise, unintentional weight loss, skin rash, eye pain, sinus congestion and sinus pain, sore throat, dysphagia,  hemoptysis , cough, dyspnea, wheezing, chest pain, palpitations, orthopnea, edema, abdominal pain, nausea, melena, diarrhea, constipation, flank pain, dysuria, hematuria, urinary  Frequency, nocturia, numbness, tingling, seizures,  Focal weakness, Loss of consciousness,  Tremor, insomnia, depression, anxiety, and suicidal ideation.     Objective:  BP 130/82 (BP Location: Left Arm, Patient Position: Sitting, Cuff Size: Large)   Pulse 98   Temp 98.1 F (36.7 C) (Oral)   Resp 15   Ht 5\' 3"  (1.6 m)   Wt 192 lb 6.4 oz (87.3 kg)   SpO2 97%   BMI 34.08 kg/m   Physical Exam  General Appearance:    Alert, cooperative, no distress, appears stated age  Head:    Normocephalic, without obvious abnormality, atraumatic  Eyes:     PERRL, conjunctiva/corneas clear, EOM's intact, fundi    benign, both eyes  Ears:    Normal TM's and external ear canals, both ears  Nose:   Nares normal, septum midline, mucosa normal, no drainage    or sinus tenderness  Throat:   Lips, mucosa, and tongue normal; teeth and gums normal  Neck:   Supple, symmetrical, trachea midline, no adenopathy;    thyroid:  no enlargement/tenderness/nodules; no carotid   bruit or JVD  Back:     Symmetric, no curvature, ROM normal, no CVA tenderness  Lungs:     Clear to auscultation bilaterally, respirations unlabored  Chest Wall:    No tenderness or deformity   Heart:    Regular rate and rhythm, S1 and S2 normal, no murmur, rub   or gallop  Breast Exam:    No tenderness, masses, or nipple abnormality  Abdomen:     Soft, non-tender, bowel sounds active all four quadrants,    no masses, no organomegaly  Genitalia:    Pelvic: cervix normal in appearance, external genitalia normal, no adnexal masses or tenderness, no cervical motion tenderness, rectovaginal septum normal,  uterus normal size, shape, and consistency and vagina normal without discharge  Extremities:   Extremities normal, atraumatic, no cyanosis or edema  Pulses:   2+ and symmetric all extremities  Skin:   Erythematous macular reticular rash on upper arms and thighs bilaterally,   Lymph nodes:   Cervical, supraclavicular, and axillary nodes normal  Neurologic:   CNII-XII intact, normal strength, sensation and reflexes    throughout     Assessment & Plan:   Problem List Items Addressed This Visit    B12 deficiency    Patient has not been taking any B12 supplements in over 6 months due to cost of injections.  Repeat b12 and IF ordered.  Lab Results  Component Value Date   VITAMINB12 203 (L) 05/23/2016         Relevant Orders   Vitamin B12   Intrinsic Factor Antibodies   RBC Folate   Depression with anxiety    Triggered by the unexpected death of her husband last year.  Symptoms  have improved and emotional state is sta le on celexa.  No changes today       Obesity    I have addressed  BMI and recommended wt loss of 10% of body weight over the next 6 months using a low glycemic index diet and regular exercise a minimum of 5 days per week.        Rash of unknown cause    Noted in 2015,, with  Vasculitis ruled out due to reticular formation and chronic headache.  Referral to dermatology advised       Relevant Orders   Ambulatory referral to Dermatology   Routine general medical examination at a health care facility    age appropriate education and counseling updated, referrals for preventative services and immunizations addressed, dietary and smoking counseling addressed, most recent labs reviewed.  I have personally reviewed and have noted:  1) the patient's medical and social history 2) The pt's use of alcohol, tobacco, and illicit drugs 3) The patient's current medications and supplements 4) Functional ability including ADL's, fall risk, home safety risk, hearing and visual impairment 5) Diet and physical activities 6) Evidence for depression or mood disorder 7) The patient's height, weight, and BMI have been recorded in the chart  I have made referrals for colonoscopy , mammogram and dermatologic evaluation , and provided counseling and education based on review of the above.  PAP smear was done today.  She has not been sexually active since her husband died over a year ago.       Tobacco abuse counseling    Briefly reviewed the risk of continued tobacco abuse, including but not limited to CAD, PAD, hypertension, and CA.  sHe is not interested in pharmacotherapy at this time.      Tubular adenoma of colon    3 polyps , all TA were retrievedin the ascending colon  during 2015 colonoscopy done in [place] .  She is requesting loocal referral for advised 3 year follow up      Relevant Orders   Ambulatory referral to Gastroenterology   Widowed    Other  Visit Diagnoses    Cervical cancer screening    -  Primary   Relevant Orders   Cytology - PAP( [place])   Hyperlipidemia LDL goal <130       Relevant Orders   Lipid panel   TSH   Breast cancer screening by mammogram       Relevant Orders  MM 3D SCREEN BREAST BILATERAL   Impaired fasting glucose       Relevant Orders   Hemoglobin A1c      I am having Mardene Celeste L. Huffines maintain her citalopram, isosorbide mononitrate, cyclobenzaprine, omeprazole, traMADol, celecoxib, tiZANidine, traMADol, and metoprolol succinate.  No orders of the defined types were placed in this encounter.   There are no discontinued medications.  Follow-up: Return in about 6 months (around 11/19/2018).   Crecencio Mc, MD

## 2018-05-21 ENCOUNTER — Other Ambulatory Visit: Payer: Self-pay

## 2018-05-21 ENCOUNTER — Encounter: Payer: Self-pay | Admitting: Internal Medicine

## 2018-05-21 DIAGNOSIS — D126 Benign neoplasm of colon, unspecified: Secondary | ICD-10-CM | POA: Insufficient documentation

## 2018-05-21 DIAGNOSIS — E538 Deficiency of other specified B group vitamins: Secondary | ICD-10-CM | POA: Insufficient documentation

## 2018-05-21 DIAGNOSIS — Z634 Disappearance and death of family member: Secondary | ICD-10-CM | POA: Insufficient documentation

## 2018-05-21 LAB — LIPID PANEL
Cholesterol: 208 mg/dL — ABNORMAL HIGH (ref 0–200)
HDL: 61.5 mg/dL (ref >39.00)
LDL Cholesterol: 127 mg/dL — ABNORMAL HIGH (ref 0–99)
NonHDL: 146.94
Total CHOL/HDL Ratio: 3
Triglycerides: 102 mg/dL (ref 0.0–149.0)
VLDL: 20.4 mg/dL (ref 0.0–40.0)

## 2018-05-21 LAB — TSH: TSH: 4.74 u[IU]/mL — ABNORMAL HIGH (ref 0.35–4.50)

## 2018-05-21 LAB — VITAMIN B12: Vitamin B-12: 240 pg/mL (ref 211–911)

## 2018-05-21 LAB — HEMOGLOBIN A1C: Hgb A1c MFr Bld: 5.8 % (ref 4.6–6.5)

## 2018-05-21 NOTE — Assessment & Plan Note (Signed)
Triggered by the unexpected death of her husband last year.  Symptoms have improved and emotional state is sta le on celexa.  No changes today

## 2018-05-21 NOTE — Assessment & Plan Note (Signed)
Patient has not been taking any B12 supplements in over 6 months due to cost of injections.  Repeat b12 and IF ordered.  Lab Results  Component Value Date   VITAMINB12 203 (L) 05/23/2016

## 2018-05-21 NOTE — Assessment & Plan Note (Signed)
age appropriate education and counseling updated, referrals for preventative services and immunizations addressed, dietary and smoking counseling addressed, most recent labs reviewed.  I have personally reviewed and have noted:  1) the patient's medical and social history 2) The pt's use of alcohol, tobacco, and illicit drugs 3) The patient's current medications and supplements 4) Functional ability including ADL's, fall risk, home safety risk, hearing and visual impairment 5) Diet and physical activities 6) Evidence for depression or mood disorder 7) The patient's height, weight, and BMI have been recorded in the chart  I have made referrals for colonoscopy , mammogram and dermatologic evaluation , and provided counseling and education based on review of the above.  PAP smear was done today.  She has not been sexually active since her husband died over a year ago.

## 2018-05-21 NOTE — Assessment & Plan Note (Addendum)
3 polyps , all TA were retrievedin the ascending colon  during 2015 colonoscopy done in [place] .  She is requesting loocal referral for advised 3 year follow up

## 2018-05-21 NOTE — Assessment & Plan Note (Signed)
I have addressed  BMI and recommended wt loss of 10% of body weight over the next 6 months using a low glycemic index diet and regular exercise a minimum of 5 days per week.   

## 2018-05-21 NOTE — Assessment & Plan Note (Signed)
Briefly reviewed the risk of continued tobacco abuse, including but not limited to CAD, PAD, hypertension, and CA.  sHe is not interested in pharmacotherapy at this time.

## 2018-05-21 NOTE — Assessment & Plan Note (Signed)
Noted in 2015,, with  Vasculitis ruled out due to reticular formation and chronic headache.  Referral to dermatology advised

## 2018-05-22 ENCOUNTER — Telehealth: Payer: Self-pay | Admitting: Internal Medicine

## 2018-05-22 ENCOUNTER — Other Ambulatory Visit: Payer: Self-pay | Admitting: Internal Medicine

## 2018-05-22 DIAGNOSIS — E538 Deficiency of other specified B group vitamins: Secondary | ICD-10-CM

## 2018-05-22 DIAGNOSIS — B373 Candidiasis of vulva and vagina: Secondary | ICD-10-CM | POA: Insufficient documentation

## 2018-05-22 DIAGNOSIS — B3731 Acute candidiasis of vulva and vagina: Secondary | ICD-10-CM | POA: Insufficient documentation

## 2018-05-22 DIAGNOSIS — R7989 Other specified abnormal findings of blood chemistry: Secondary | ICD-10-CM

## 2018-05-22 DIAGNOSIS — R7303 Prediabetes: Secondary | ICD-10-CM

## 2018-05-22 LAB — CYTOLOGY - PAP
Adequacy: ABSENT
Diagnosis: NEGATIVE

## 2018-05-22 MED ORDER — FLUCONAZOLE 150 MG PO TABS: 150.0000 mg | ORAL_TABLET | Freq: Every day | ORAL | 0 refills | Status: DC

## 2018-05-23 LAB — FOLATE RBC: RBC Folate: 579 ng/mL RBC (ref >280)

## 2018-05-23 LAB — INTRINSIC FACTOR ANTIBODIES: Intrinsic Factor: NEGATIVE

## 2018-05-30 ENCOUNTER — Other Ambulatory Visit: Payer: Self-pay | Admitting: Internal Medicine

## 2018-05-30 ENCOUNTER — Telehealth: Payer: Self-pay | Admitting: Internal Medicine

## 2018-05-30 NOTE — Telephone Encounter (Signed)
I have called & reviewed labs with patient. She has scheduled lab appointment for 12/02/18.

## 2018-05-30 NOTE — Telephone Encounter (Signed)
Copied from [place] [id number]. Topic: Quick Communication - See Telephone Encounter >> May 30, 2018  4:11 PM Melanie Fisher wrote: CRM for notification. See Telephone encounter for: 05/30/18.  Pt is requesting her lab results.   CB: [phone]

## 2018-06-03 ENCOUNTER — Encounter: Payer: Self-pay | Admitting: Student

## 2018-06-04 ENCOUNTER — Ambulatory Visit: Payer: BC Managed Care – PPO | Admitting: Anesthesiology

## 2018-06-04 ENCOUNTER — Encounter
Admission: RE | Disposition: A | Discharge status: Home / Self Care | Payer: Self-pay | Source: Home / Self Care | Attending: Gastroenterology

## 2018-06-04 ENCOUNTER — Ambulatory Visit
Admission: RE | Admit: 2018-06-04 | Discharge: 2018-06-04 | Disposition: A | Discharge status: Home / Self Care | Payer: BC Managed Care – PPO | Attending: Gastroenterology | Admitting: Gastroenterology

## 2018-06-04 DIAGNOSIS — K640 First degree hemorrhoids: Secondary | ICD-10-CM | POA: Insufficient documentation

## 2018-06-04 DIAGNOSIS — F419 Anxiety disorder, unspecified: Secondary | ICD-10-CM | POA: Diagnosis not present

## 2018-06-04 DIAGNOSIS — K219 Gastro-esophageal reflux disease without esophagitis: Secondary | ICD-10-CM | POA: Diagnosis not present

## 2018-06-04 DIAGNOSIS — Z79899 Other long term (current) drug therapy: Secondary | ICD-10-CM | POA: Diagnosis not present

## 2018-06-04 DIAGNOSIS — D126 Benign neoplasm of colon, unspecified: Secondary | ICD-10-CM

## 2018-06-04 DIAGNOSIS — F17210 Nicotine dependence, cigarettes, uncomplicated: Secondary | ICD-10-CM | POA: Diagnosis not present

## 2018-06-04 DIAGNOSIS — Z1211 Encounter for screening for malignant neoplasm of colon: Secondary | ICD-10-CM | POA: Insufficient documentation

## 2018-06-04 DIAGNOSIS — F329 Major depressive disorder, single episode, unspecified: Secondary | ICD-10-CM | POA: Insufficient documentation

## 2018-06-04 DIAGNOSIS — I10 Essential (primary) hypertension: Secondary | ICD-10-CM | POA: Insufficient documentation

## 2018-06-04 DIAGNOSIS — Z86010 Personal history of colonic polyps: Secondary | ICD-10-CM | POA: Insufficient documentation

## 2018-06-04 DIAGNOSIS — J449 Chronic obstructive pulmonary disease, unspecified: Secondary | ICD-10-CM | POA: Insufficient documentation

## 2018-06-04 HISTORY — PX: COLONOSCOPY WITH PROPOFOL: SHX5780

## 2018-06-04 HISTORY — DX: Essential (primary) hypertension: I10

## 2018-06-04 HISTORY — DX: Gastro-esophageal reflux disease without esophagitis: K21.9

## 2018-06-04 SURGERY — COLONOSCOPY WITH PROPOFOL
Anesthesia: General

## 2018-06-04 MED ORDER — METOPROLOL SUCCINATE ER 25 MG PO TB24: 50.0000 mg | ORAL_TABLET | Freq: Once | ORAL | Status: DC

## 2018-06-04 MED ORDER — PROPOFOL 10 MG/ML IV BOLUS
INTRAVENOUS | Status: DC | PRN
  Administered 2018-06-04: 20 mg via INTRAVENOUS
  Administered 2018-06-04: 80 mg via INTRAVENOUS

## 2018-06-04 MED ORDER — SODIUM CHLORIDE 0.9 % IV SOLN
INTRAVENOUS | Status: DC
  Administered 2018-06-04: 1000 mL via INTRAVENOUS

## 2018-06-04 MED ORDER — PROPOFOL 500 MG/50ML IV EMUL
INTRAVENOUS | Status: DC | PRN
  Administered 2018-06-04: 200 ug/kg/min via INTRAVENOUS

## 2018-06-04 MED ORDER — LIDOCAINE 2% (20 MG/ML) 5 ML SYRINGE
INTRAMUSCULAR | Status: DC | PRN
  Administered 2018-06-04: 50 mg via INTRAVENOUS

## 2018-06-04 MED ORDER — PROPOFOL 500 MG/50ML IV EMUL
INTRAVENOUS | Status: AC
  Filled 2018-06-04: qty 50

## 2018-06-04 NOTE — Transfer of Care (Signed)
Immediate Anesthesia Transfer of Care Note  Patient: Melanie Fisher  Procedure(s) Performed: COLONOSCOPY WITH PROPOFOL (N/A )  Patient Location: PACU  Anesthesia Type:General  Level of Consciousness: awake, alert  and oriented  Airway & Oxygen Therapy: Patient connected to nasal cannula oxygen  Post-op Assessment: Post -op Vital signs reviewed and stable  Post vital signs: stable  Last Vitals:  Vitals Value Taken Time  BP 113/101 06/04/2018  8:31 AM  Temp    Pulse 87 06/04/2018  8:31 AM  Resp 15 06/04/2018  8:31 AM  SpO2 100 % 06/04/2018  8:31 AM  Vitals shown include unvalidated device data.  Last Pain:  Vitals:   06/04/18 0719  TempSrc: Tympanic  PainSc: 0-No pain         Complications: No apparent anesthesia complications

## 2018-06-04 NOTE — Op Note (Signed)
[facility] Patient Name: Melanie Fisher Procedure Date: 06/04/2018 7:26 AM MRN: [id number] Account #: [id number] Date of Birth: 27-Aug-1965 Admit Type: Outpatient Age: 52 Room: [facility] ENDO ROOM 1 Gender: Female Note Status: Finalized Procedure:            Colonoscopy Indications:          High risk colon cancer surveillance: Personal history                        of colonic polyps Providers:            Jonathon Bellows MD, MD Referring MD:         Deborra Medina, MD (Referring MD) Medicines:            Monitored Anesthesia Care Complications:        No immediate complications. Procedure:            Pre-Anesthesia Assessment:                       - ASA Grade Assessment: II - A patient with mild                        systemic disease.                       After obtaining informed consent, the colonoscope was                        passed under direct vision. Throughout the procedure,                        the patient's blood pressure, pulse, and oxygen                        saturations were monitored continuously. The                        Colonoscope was introduced through the anus and                        advanced to the the cecum, identified by the                        appendiceal orifice, IC valve and transillumination.                        The colonoscopy was performed with ease. The patient                        tolerated the procedure well. The quality of the bowel                        preparation was good. Findings:      Non-bleeding internal hemorrhoids were found during retroflexion. The       hemorrhoids were small and Grade I (internal hemorrhoids that do not       prolapse).      The exam was otherwise without abnormality on direct and retroflexion       views. Impression:           - Non-bleeding internal hemorrhoids.                       -  The examination was otherwise normal on direct and   retroflexion views.                       - No specimens collected. Recommendation:       - Discharge patient to home (with escort).                       - Resume previous diet.                       - Continue present medications.                       - Repeat colonoscopy in 5 years for surveillance. Procedure Code(s):    --- Professional ---                       [phone], Colonoscopy, flexible; diagnostic, including                        collection of specimen(s) by brushing or washing, when                        performed (separate procedure) Diagnosis Code(s):    --- Professional ---                       Z86.010, Personal history of colonic polyps                       K64.0, First degree hemorrhoids CPT copyright 2018 [employer]. All rights reserved. The codes documented in this report are preliminary and upon coder review may  be revised to meet current compliance requirements. Jonathon Bellows, MD Jonathon Bellows MD, MD 06/04/2018 8:27:17 AM This report has been signed electronically. Number of Addenda: 0 Note Initiated On: 06/04/2018 7:26 AM Scope Withdrawal Time: 0 hours 8 minutes 27 seconds  Total Procedure Duration: 0 hours 12 minutes 0 seconds       [facility]

## 2018-06-04 NOTE — Anesthesia Preprocedure Evaluation (Signed)
Anesthesia Evaluation  Patient identified by MRN, date of birth, ID band Patient awake    Reviewed: Allergy & Precautions, H&P , NPO status , Patient's Chart, lab work & pertinent test results, reviewed documented beta blocker date and time   Airway Mallampati: II   Neck ROM: full    Dental  (+) Poor Dentition   Pulmonary neg pulmonary ROS, asthma , sleep apnea , COPD, Current Smoker,    Pulmonary exam normal breath sounds clear to auscultation       Cardiovascular Exercise Tolerance: Good hypertension, On Medications negative cardio ROS Normal cardiovascular exam+ Valvular Problems/Murmurs  Rhythm:regular Rate:Normal     Neuro/Psych PSYCHIATRIC DISORDERS Anxiety Depression  Neuromuscular disease negative neurological ROS  negative psych ROS   GI/Hepatic negative GI ROS, Neg liver ROS, GERD  Medicated,  Endo/Other  negative endocrine ROS  Renal/GU negative Renal ROS  negative genitourinary   Musculoskeletal   Abdominal   Peds  Hematology negative hematology ROS (+)   Anesthesia Other Findings Past Medical History: No date: Allergy No date: Anxiety No date: Asthma No date: Depression No date: GERD (gastroesophageal reflux disease) No date: Heart murmur No date: Hyperlipidemia No date: Hypertension No date: Sleep apnea No date: Tobacco abuse Past Surgical History: 2008: ABDOMINAL HYSTERECTOMY     Comment:  partial No date: right toe bunionectomy No date: right wrist ganglion cyst removal BMI    Body Mass Index:  35.07 kg/m     Reproductive/Obstetrics negative OB ROS                             Anesthesia Physical Anesthesia Plan  ASA: III  Anesthesia Plan: General   Post-op Pain Management:    Induction:   PONV Risk Score and Plan:   Airway Management Planned:   Additional Equipment:   Intra-op Plan:   Post-operative Plan:   Informed Consent: I have reviewed  the patients History and Physical, chart, labs and discussed the procedure including the risks, benefits and alternatives for the proposed anesthesia with the patient or authorized representative who has indicated his/her understanding and acceptance.   Dental Advisory Given  Plan Discussed with: CRNA  Anesthesia Plan Comments:         Anesthesia Quick Evaluation

## 2018-06-04 NOTE — H&P (Signed)
Melanie Bellows, MD [address] [place], [place], [place], [address] [address], [place], [place], [place], [address] Phone: [phone]  Fax: [phone]  Primary Care Physician:  Crecencio Mc, MD   Pre-Procedure History & Physical: HPI:  Melanie Fisher is a 52 y.o. female is here for an colonoscopy.   Past Medical History:  Diagnosis Date  . Allergy   . Anxiety   . Asthma   . Depression   . GERD (gastroesophageal reflux disease)   . Heart murmur   . Hyperlipidemia   . Hypertension   . Sleep apnea   . Tobacco abuse     Past Surgical History:  Procedure Laterality Date  . ABDOMINAL HYSTERECTOMY  2008   partial  . right toe bunionectomy    . right wrist ganglion cyst removal      Prior to Admission medications   Medication Sig Start Date End Date Taking? Authorizing Provider  isosorbide mononitrate (IMDUR) 30 MG 24 hr tablet TAKE 1 TABLET BY MOUTH ONCE DAILY 05/31/18  Yes Crecencio Mc, MD  metoprolol succinate (TOPROL-XL) 50 MG 24 hr tablet Take 1 tablet (50 mg total) by mouth daily. 04/05/18  Yes Crecencio Mc, MD  celecoxib (CELEBREX) 200 MG capsule Take 1 capsule (200 mg total) by mouth daily. 04/05/18   Crecencio Mc, MD  citalopram (CELEXA) 20 MG tablet Take 1 tablet (20 mg total) by mouth daily. 05/23/16   Crecencio Mc, MD  cyclobenzaprine (FLEXERIL) 5 MG tablet Take 1 tablet (5 mg total) by mouth 3 (three) times daily as needed for muscle spasms. 03/27/18   Jodelle Green, FNP  fluconazole (DIFLUCAN) 150 MG tablet Take 1 tablet (150 mg total) by mouth daily. 05/22/18   Crecencio Mc, MD  omeprazole (PRILOSEC) 40 MG capsule Take 1 capsule (40 mg total) by mouth daily. Patient taking differently: Take 40 mg by mouth at bedtime.  03/27/18   Guse, Jacquelynn Cree, FNP  tiZANidine (ZANAFLEX) 4 MG capsule Take 1 capsule (4 mg total) by mouth 3 (three) times daily. 04/05/18   Crecencio Mc, MD    Allergies as of 05/22/2018  . (No Known Allergies)     Family History  Problem Relation Age of Onset  . Kidney disease Mother 9       ESKD, partial neprhectomy  . COPD Mother   . Heart disease Father        CABG at age 84  . Alcohol abuse Father        until late 38  . Diabetes Father   . Hypertension Father   . Diabetes Sister   . Cancer Sister        uterine ca  . Cancer Maternal Uncle 60       stage 4 colon CA  . Colon cancer Maternal Uncle   . Esophageal cancer Neg Hx   . Pancreatic cancer Neg Hx   . Rectal cancer Neg Hx   . Stomach cancer Neg Hx     Social History   Socioeconomic History  . Marital status: Married    Spouse name: Not on file  . Number of children: Not on file  . Years of education: Not on file  . Highest education level: Not on file  Occupational History  . Not on file  Social Needs  . Financial resource strain: Not on file  . Food insecurity:    Worry: Not on file    Inability: Not on  file  . Transportation needs:    Medical: Not on file    Non-medical: Not on file  Tobacco Use  . Smoking status: Current Every Day Smoker    Packs/day: 0.50    Years: 21.00    Pack years: 10.50    Types: Cigarettes  . Smokeless tobacco: Never Used  Substance and Sexual Activity  . Alcohol use: Yes    Comment: occasionally  . Drug use: No  . Sexual activity: Not Currently  Lifestyle  . Physical activity:    Days per week: Not on file    Minutes per session: Not on file  . Stress: Not on file  Relationships  . Social connections:    Talks on phone: Not on file    Gets together: Not on file    Attends religious service: Not on file    Active member of club or organization: Not on file    Attends meetings of clubs or organizations: Not on file    Relationship status: Not on file  . Intimate partner violence:    Fear of current or ex partner: Not on file    Emotionally abused: Not on file    Physically abused: Not on file    Forced sexual activity: Not on file  Other Topics Concern  . Not on  file  Social History Narrative  . Not on file    Review of Systems: See HPI, otherwise negative ROS  Physical Exam: BP (!) 158/109   Pulse (!) 101   Temp 97.7 F (36.5 C) (Tympanic)   Resp 16   Ht 5\' 3"  (1.6 m)   Wt 89.8 kg   SpO2 99%   BMI 35.07 kg/m  General:   Alert,  pleasant and cooperative in NAD Head:  Normocephalic and atraumatic. Neck:  Supple; no masses or thyromegaly. Lungs:  Clear throughout to auscultation, normal respiratory effort.    Heart:  +S1, +S2, Regular rate and rhythm, No edema. Abdomen:  Soft, nontender and nondistended. Normal bowel sounds, without guarding, and without rebound.   Neurologic:  Alert and  oriented x4;  grossly normal neurologically.  Impression/Plan: [name] is here for an colonoscopy to be performed for surveillance due to prior history of colon polyps   Risks, benefits, limitations, and alternatives regarding  colonoscopy have been reviewed with the patient.  Questions have been answered.  All parties agreeable.   Melanie Bellows, MD  06/04/2018, 8:06 AM

## 2018-06-04 NOTE — Anesthesia Post-op Follow-up Note (Signed)
Anesthesia QCDR form completed.        

## 2018-06-04 NOTE — OR Nursing (Signed)
Dr Andree Elk ordered pt to take her own dose of metoprolol.  Pt did with a sip of water

## 2018-06-05 ENCOUNTER — Encounter: Payer: Self-pay | Admitting: Gastroenterology

## 2018-06-05 NOTE — Anesthesia Postprocedure Evaluation (Signed)
Anesthesia Post Note  Patient: Melanie Fisher  Procedure(s) Performed: COLONOSCOPY WITH PROPOFOL (N/A )  Patient location during evaluation: PACU Anesthesia Type: General Level of consciousness: awake and alert Pain management: pain level controlled Vital Signs Assessment: post-procedure vital signs reviewed and stable Respiratory status: spontaneous breathing, nonlabored ventilation, respiratory function stable and patient connected to nasal cannula oxygen Cardiovascular status: blood pressure returned to baseline and stable Postop Assessment: no apparent nausea or vomiting Anesthetic complications: no     Last Vitals:  Vitals:   06/04/18 0850 06/04/18 0851  BP: (!) 144/94 (!) 144/94  Pulse: 79 79  Resp: 19 19  Temp:    SpO2: 100% 100%    Last Pain:  Vitals:   06/04/18 0719  TempSrc: Tympanic  PainSc: 0-No pain                 Molli Barrows

## 2018-06-10 MED ORDER — CITALOPRAM HYDROBROMIDE 10 MG PO TABS: 10.0000 mg | ORAL_TABLET | Freq: Every day | ORAL | 2 refills | Status: DC

## 2018-06-10 NOTE — Telephone Encounter (Signed)
Scheduled patient for Friday and advised if depression symptoms worsen she should be seen sooner at [facility]. Patient agreed.

## 2018-06-14 ENCOUNTER — Encounter: Payer: Self-pay | Admitting: Internal Medicine

## 2018-06-14 ENCOUNTER — Ambulatory Visit: Payer: BC Managed Care – PPO | Admitting: Internal Medicine

## 2018-06-14 DIAGNOSIS — F418 Other specified anxiety disorders: Secondary | ICD-10-CM

## 2018-06-14 NOTE — Progress Notes (Signed)
Subjective:  Patient ID: Melanie Fisher, female    DOB: 03-Mar-1966  Age: 52 y.o. MRN: [id number]  CC: The encounter diagnosis was Depression with anxiety.  HPI Melanie Fisher presents for symptoms of recurrent depression.  She notes that she has been sleeping a lot when she is alone .  During the day naps for  up to 4-5 hours during her Christmas the vacation..  Son  shave been living with her since her husband died due to financial stress .  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  Does not return to work until jan 6th   Admits to drinking too much in the evening  To manage hr anxiety .  No withdrawal symptoms.  Was referred to dermatology in December for suspected livedo reticularis (confirmed by dermatology exam,  No biopsy done)  Causes explored in 2015 and recent CT abd/pelvis and lipid panel reviewed) . s he was also treated foreal   (con Outpatient Medications Prior to Visit  Medication Sig Dispense Refill  . celecoxib (CELEBREX) 200 MG capsule Take 1 capsule (200 mg total) by mouth daily. 30 capsule 2  . cyclobenzaprine (FLEXERIL) 5 MG tablet Take 1 tablet (5 mg total) by mouth 3 (three) times daily as needed for muscle spasms. 30 tablet 1  . fluconazole (DIFLUCAN) 150 MG tablet Take 1 tablet (150 mg total) by mouth daily. 2 tablet 0  . isosorbide mononitrate (IMDUR) 30 MG 24 hr tablet TAKE 1 TABLET BY MOUTH ONCE DAILY 90 tablet 0  . metoprolol succinate (TOPROL-XL) 50 MG 24 hr tablet Take 1 tablet (50 mg total) by mouth daily. 30 tablet 2  . omeprazole (PRILOSEC) 40 MG capsule Take 1 capsule (40 mg total) by mouth daily. (Patient taking differently: Take 40 mg by mouth at bedtime. ) 90 capsule 1  . tiZANidine (ZANAFLEX) 4 MG capsule Take 1 capsule (4 mg total) by mouth 3 (three) times daily. 30 capsule 2    . citalopram (CELEXA) 10 MG tablet Take 1 tablet (10 mg total) by mouth daily. (Patient not taking: Reported on 06/14/2018) 30 tablet 2   No facility-administered medications prior to visit.     Review of Systems;  Patient denies headache, fevers, malaise, unintentional weight loss, skin rash, eye pain, sinus congestion and sinus pain, sore throat, dysphagia,  hemoptysis , cough, dyspnea, wheezing, chest pain, palpitations, orthopnea, edema, abdominal pain, nausea, melena, diarrhea, constipation, flank pain, dysuria, hematuria, urinary  Frequency, nocturia, numbness, tingling, seizures,  Focal weakness, Loss of consciousness,  Tremor, insomnia, depression, anxiety, and suicidal ideation.      Objective:  BP 120/82 (BP Location: Left Arm, Patient Position: Sitting, Cuff Size: Normal)   Pulse 77   Temp 98.2 F (36.8 C) (Oral)   Resp 15   Ht 5\' 3"  (1.6 m)   Wt 187 lb 12.8 oz (85.2 kg)   SpO2 96%   BMI 33.27 kg/m   BP Readings from Last 3 Encounters:  06/14/18 120/82  06/04/18 (!) 144/94  05/20/18 130/82    Wt Readings from Last 3 Encounters:  06/14/18 187 lb 12.8 oz (85.2 kg)  06/04/18 198 lb (89.8 kg)  05/20/18 192 lb 6.4 oz (87.3 kg)    General appearance: alert, cooperative and appears stated age Ears: normal TM's and external  ear canals both ears Throat: lips, mucosa, and tongue normal; teeth and gums normal Neck: no adenopathy, no carotid bruit, supple, symmetrical, trachea midline and thyroid not enlarged, symmetric, no tenderness/mass/nodules Back: symmetric, no curvature. ROM normal. No CVA tenderness. Lungs: clear to auscultation bilaterally Heart: regular rate and rhythm, S1, S2 normal, no murmur, click, rub or gallop Abdomen: soft, non-tender; bowel sounds normal; no masses,  no organomegaly Pulses: 2+ and symmetric Skin: Skin color, texture, turgor normal. No rashes or lesions Lymph nodes: Cervical, supraclavicular, and axillary nodes normal. Psych: affect  normal, makes good eye contact. No fidgeting,  Smiles/cries  easily.  Denies suicidal thoughts   Lab Results  Component Value Date   HGBA1C 5.8 05/20/2018   HGBA1C 5.6 05/02/2017   HGBA1C 5.4 05/29/2016    Lab Results  Component Value Date   CREATININE 0.90 04/01/2018   CREATININE 0.86 03/27/2018   CREATININE 0.73 04/04/2017    Lab Results  Component Value Date   WBC 4.8 04/01/2018   HGB 14.3 04/01/2018   HCT 41.6 04/01/2018   PLT 196 04/01/2018   GLUCOSE 118 (H) 04/01/2018   CHOL 208 (H) 05/20/2018   TRIG 102.0 05/20/2018   HDL 61.50 05/20/2018   LDLDIRECT 131.0 05/23/2016   LDLCALC 127 (H) 05/20/2018   ALT 16 04/01/2018   AST 18 04/01/2018   NA 140 04/01/2018   K 3.9 04/01/2018   CL 107 04/01/2018   CREATININE 0.90 04/01/2018   BUN 17 04/01/2018   CO2 24 04/01/2018   TSH 4.74 (H) 05/20/2018   HGBA1C 5.8 05/20/2018    No results found.  Assessment & Plan:   Problem List Items Addressed This Visit    Depression with anxiety    Aggravated bu financial stressors, anxiety. History of PTSD secondary to husband's unexpected death while out of town.  Resuming Celexa with 5 mg dose .  Prior episode was effectively  With  20 mg dose. Advised to reduce alcohol intake.           I am having Melanie Fisher maintain her cyclobenzaprine, omeprazole, celecoxib, tiZANidine, metoprolol succinate, fluconazole, isosorbide mononitrate, and citalopram.  No orders of the defined types were placed in this encounter.   There are no discontinued medications.  Follow-up: No follow-ups on file.   Crecencio Mc, MD

## 2018-06-14 NOTE — Patient Instructions (Signed)
Please start the (celexa)  escitalopram at 1/2 tablet daily in the evening for the first few days (up to a week)  to avoid nausea.  You can increase to a full tablet after a minimum of 4 days if you have  not developed side effects of nausea.  If the medication  intterferes with your sleep, take it in the morning instead  Please contact me in 3 weeks ,  to let me know how it is helping your depression.  We can increase the dose to 20 mg   I WANT YOU TO GO FOR A WALK OUTSIDE EVERY DAY FOR 15 MINUTES MINIMUM.  DO IT IN THE MORNING OR BEFORE 3 PM TO GET THE BENEFIT OF SUNSHINE  REDUCE YOUR ALCOHOL TO ONE DRINK NIGHTLY,  OK TO HAVE 2 ON THE WEEKENDS IF YOU ARE NOT ALONE

## 2018-06-15 ENCOUNTER — Encounter: Payer: Self-pay | Admitting: Internal Medicine

## 2018-06-15 NOTE — Assessment & Plan Note (Signed)
Aggravated bu financial stressors, anxiety. History of PTSD secondary to husband's unexpected death while out of town.  Resuming Celexa with 5 mg dose .  Prior episode was effectively  With  20 mg dose. Advised to reduce alcohol intake.

## 2018-07-17 ENCOUNTER — Ambulatory Visit
Admission: RE | Admit: 2018-07-17 | Discharge: 2018-07-17 | Disposition: A | Discharge status: Home / Self Care | Payer: BC Managed Care – PPO | Source: Ambulatory Visit | Attending: Internal Medicine | Admitting: Internal Medicine

## 2018-07-17 DIAGNOSIS — Z1231 Encounter for screening mammogram for malignant neoplasm of breast: Secondary | ICD-10-CM | POA: Insufficient documentation

## 2018-08-11 ENCOUNTER — Other Ambulatory Visit: Payer: Self-pay | Admitting: Internal Medicine

## 2018-08-11 DIAGNOSIS — I10 Essential (primary) hypertension: Secondary | ICD-10-CM

## 2018-09-18 ENCOUNTER — Other Ambulatory Visit: Payer: Self-pay | Admitting: Internal Medicine

## 2018-09-20 NOTE — Telephone Encounter (Signed)
Pt has been scheduled for Monday 09/23/2018 at 2:30pm for virtual visit. Pt is aware of appt date and time.

## 2018-09-23 ENCOUNTER — Ambulatory Visit (INDEPENDENT_AMBULATORY_CARE_PROVIDER_SITE_OTHER): Payer: BC Managed Care – PPO | Admitting: Internal Medicine

## 2018-09-23 DIAGNOSIS — F418 Other specified anxiety disorders: Secondary | ICD-10-CM | POA: Diagnosis not present

## 2018-09-23 MED ORDER — CITALOPRAM HYDROBROMIDE 20 MG PO TABS: 20.0000 mg | ORAL_TABLET | Freq: Every day | ORAL | 1 refills | Status: DC

## 2018-09-23 NOTE — Patient Instructions (Signed)
I recommend that you increase your dose of celexa (citalopram) to 20 mg daily.  I ALSO WANT YOU TO RESUME YOUR DAILY WALK SOMEWHERE!  You may find that taking it in the evening helps you sleep better.  If you are still tossing and turning in a week,  Let me know and I will call in a sleep medication called "restoril."   (temazepam).    Temazepam is a benzodiazepine used primarily for insomnia (longer acting than xanax) .  Long term use is not advised due to the risk of addiction and dementia  but for the next few months you can use it as needed.    Please schedule another virtual follow up in  3-4 weeks   Regards,   Deborra Medina, MD

## 2018-09-23 NOTE — Progress Notes (Signed)
Virtual Visit via Video Note  I attempted to connect  with@ on 09/23/18 at  2:30 PM EDT by a video enabled telemedicine application but after 15 minutes of recurrent attempts she was unable to achieve video capability and continued with a telephone encounter .  I  verified that I am speaking with the correct person using two identifiers.  Location patient: home Location provider:work Persons participating in the virtual visit: patient, provider  I discussed the limitations of evaluation and management by telemedicine and the availability of in person appointments. The patient expressed understanding and agreed to proceed.   HPI:  1) worsening depression: started on celexa in December . symptoms improved ,  She was walking daily and getting out more.  However , since the epidemic she has been feeling sad and  sleeping more. Going to work daily, worried about losing her job ,  And  the local park hours have changed so she can't go anymore after work. She has reduced her use of alcohol to 3 drinks per week.  She has  Increased anxiety due  to financial stressors.  House repairs continue to mount. She has 3 adult children living with her.  Her son has  become financially supportive.  ROS: See pertinent positives and negatives per HPI.  Past Medical History:  Diagnosis Date  . Allergy   . Anxiety   . Asthma   . Depression   . GERD (gastroesophageal reflux disease)   . Heart murmur   . Hyperlipidemia   . Hypertension   . Sleep apnea   . Tobacco abuse     Past Surgical History:  Procedure Laterality Date  . ABDOMINAL HYSTERECTOMY  2008   partial  . COLONOSCOPY WITH PROPOFOL N/A 06/04/2018   Procedure: COLONOSCOPY WITH PROPOFOL;  Surgeon: Jonathon Bellows, MD;  Location: [facility] ENDOSCOPY;  Service: Gastroenterology;  Laterality: N/A;  . right toe bunionectomy    . right wrist ganglion cyst removal      Family History  Problem Relation Age of Onset  . Kidney disease Mother 19       ESKD,  partial neprhectomy  . COPD Mother   . Heart disease Father        CABG at age 21  . Alcohol abuse Father        until late 77  . Diabetes Father   . Hypertension Father   . Diabetes Sister   . Cancer Sister        uterine ca  . Cancer Maternal Uncle 60       stage 4 colon CA  . Colon cancer Maternal Uncle   . Esophageal cancer Neg Hx   . Pancreatic cancer Neg Hx   . Rectal cancer Neg Hx   . Stomach cancer Neg Hx   . Breast cancer Neg Hx     SOCIAL HX: widowed,  Works for the school system making lunches    Current Outpatient Medications:  .  celecoxib (CELEBREX) 200 MG capsule, Take 1 capsule (200 mg total) by mouth daily., Disp: 30 capsule, Rfl: 2 .  citalopram (CELEXA) 10 MG tablet, Take 1 tablet (10 mg total) by mouth daily. (Patient not taking: Reported on 06/14/2018), Disp: 30 tablet, Rfl: 2 .  cyclobenzaprine (FLEXERIL) 5 MG tablet, Take 1 tablet (5 mg total) by mouth 3 (three) times daily as needed for muscle spasms., Disp: 30 tablet, Rfl: 1 .  isosorbide mononitrate (IMDUR) 30 MG 24 hr tablet, Take 1 tablet by mouth once  daily, Disp: 90 tablet, Rfl: 1 .  metoprolol succinate (TOPROL-XL) 50 MG 24 hr tablet, TAKE 1 TABLET BY MOUTH ONCE DAILY, Disp: 90 tablet, Rfl: 0 .  omeprazole (PRILOSEC) 40 MG capsule, Take 1 capsule (40 mg total) by mouth daily. (Patient taking differently: Take 40 mg by mouth at bedtime. ), Disp: 90 capsule, Rfl: 1 .  tiZANidine (ZANAFLEX) 4 MG capsule, Take 1 capsule (4 mg total) by mouth 3 (three) times daily., Disp: 30 capsule, Rfl: 2  EXAM:  VITALS per patient if applicable:  General impression:: alert, cooperative and articulate.  No signs of being in distress  Lungs: not short of breath ,  No cough, speaking in full sentences  Psych: affect normal,speech is articulate and non pressured .  Denies suicidal thoughts    ASSESSMENT AND PLAN:  Discussed the following assessment and plan: Depression with anxiety She had initial improvement  with initial celexa dose,  But the COVID epidemic has created additional financial stressors and restriction of social activities.   Primary symptoms:  Sleeping too much,  Anhedonia,  Loneliness,  Lack of motivation. Advised to increase celexa dose to 20 mg daily in the evening to also help with insomnia.  One month follow up    Updated Medication List Outpatient Encounter Medications as of 09/23/2018  Medication Sig  . celecoxib (CELEBREX) 200 MG capsule Take 1 capsule (200 mg total) by mouth daily.  . citalopram (CELEXA) 20 MG tablet Take 1 tablet (20 mg total) by mouth daily.  . cyclobenzaprine (FLEXERIL) 5 MG tablet Take 1 tablet (5 mg total) by mouth 3 (three) times daily as needed for muscle spasms.  . isosorbide mononitrate (IMDUR) 30 MG 24 hr tablet Take 1 tablet by mouth once daily  . metoprolol succinate (TOPROL-XL) 50 MG 24 hr tablet TAKE 1 TABLET BY MOUTH ONCE DAILY  . omeprazole (PRILOSEC) 40 MG capsule Take 1 capsule (40 mg total) by mouth daily. (Patient taking differently: Take 40 mg by mouth at bedtime. )  . [DISCONTINUED] citalopram (CELEXA) 10 MG tablet Take 1 tablet (10 mg total) by mouth daily.  . [DISCONTINUED] fluconazole (DIFLUCAN) 150 MG tablet Take 1 tablet (150 mg total) by mouth daily. (Patient not taking: Reported on 09/23/2018)  . [DISCONTINUED] tiZANidine (ZANAFLEX) 4 MG capsule Take 1 capsule (4 mg total) by mouth 3 (three) times daily. (Patient not taking: Reported on 09/23/2018)   No facility-administered encounter medications on file as of 09/23/2018.       I discussed the assessment and treatment plan with the patient. The patient was provided an opportunity to ask questions and all were answered. The patient agreed with the plan and demonstrated an understanding of the instructions.   The patient was advised to call back or seek an in-person evaluation if the symptoms worsen or if the condition fails to improve as anticipated.  I provided 25 minutes of  non-face-to-face time during this encounter.   Crecencio Mc, MD

## 2018-09-24 NOTE — Assessment & Plan Note (Addendum)
She had initial improvement with initial celexa dose,  But the COVID epidemic has created additional financial stressors and restriction of social activities.   Primary symptoms:  Sleeping too much,  Anhedonia,  Loneliness,  Lack of motivation. Advised to increase celexa dose to 20 mg daily in the evening to also help with insomnia.  One month follow up

## 2018-10-15 ENCOUNTER — Encounter: Payer: Self-pay | Admitting: Family Medicine

## 2018-10-15 ENCOUNTER — Ambulatory Visit (INDEPENDENT_AMBULATORY_CARE_PROVIDER_SITE_OTHER): Payer: BC Managed Care – PPO | Admitting: Family Medicine

## 2018-10-15 ENCOUNTER — Other Ambulatory Visit: Payer: Self-pay

## 2018-10-15 DIAGNOSIS — J019 Acute sinusitis, unspecified: Secondary | ICD-10-CM | POA: Diagnosis not present

## 2018-10-15 DIAGNOSIS — R05 Cough: Secondary | ICD-10-CM | POA: Diagnosis not present

## 2018-10-15 DIAGNOSIS — R0982 Postnasal drip: Secondary | ICD-10-CM | POA: Diagnosis not present

## 2018-10-15 DIAGNOSIS — R059 Cough, unspecified: Secondary | ICD-10-CM

## 2018-10-15 MED ORDER — FLUTICASONE PROPIONATE 50 MCG/ACT NA SUSP: 2.0000 | Freq: Every day | NASAL | 6 refills | Status: DC

## 2018-10-15 MED ORDER — AMOXICILLIN-POT CLAVULANATE 875-125 MG PO TABS: 1.0000 | ORAL_TABLET | Freq: Two times a day (BID) | ORAL | 0 refills | Status: DC

## 2018-10-15 NOTE — Progress Notes (Signed)
Patient ID: Melanie Fisher, female   DOB: 04-01-1966, 53 y.o.   MRN: [id number]  Virtual Visit via Video Note  This visit type was conducted due to national recommendations for restrictions regarding the COVID-19 pandemic (e.g. social distancing).  This format is felt to be most appropriate for this patient at this time.  All issues noted in this document were discussed and addressed.  No physical exam was performed (except for noted visual exam findings with Video Visits).   I connected with Melanie Fisher on 10/15/18 at  9:20 AM EDT by a video enabled telemedicine application or telephone and verified that I am speaking with the correct person using two identifiers. Location patient: home Location provider: [facility] Persons participating in the virtual visit: patient, provider  I discussed the limitations, risks, security and privacy concerns of performing an evaluation and management service by video and the availability of in person appointments. I also discussed with the patient that there may be a patient responsible charge related to this service. The patient expressed understanding and agreed to proceed.   HPI:  Patient and I connected via video due to complaints of sinus pressure and congestion, thick nasal drainage and dry cough that has been present for about 10 days.  States that she tried to take some loratadine, begin taking that about 3 days ago but does not seem to be helping much with her symptoms.  States drainage is thick and yellow.  Feels a lot of pressure across forehead and above teeth.  Feels like she is constantly clearing her throat.  Denies shortness of breath or wheezing.  Denies chest pain.  Denies palpitations.  Denies fever or chills.  Denies body aches.  Denies known exposure to anyone who is under investigation for being tested for COVID-19  ROS: See pertinent positives and negatives per HPI.  Past Medical History:  Diagnosis Date  . Allergy   .  Anxiety   . Asthma   . Depression   . GERD (gastroesophageal reflux disease)   . Heart murmur   . Hyperlipidemia   . Hypertension   . Sleep apnea   . Tobacco abuse     Past Surgical History:  Procedure Laterality Date  . ABDOMINAL HYSTERECTOMY  2008   partial  . COLONOSCOPY WITH PROPOFOL N/A 06/04/2018   Procedure: COLONOSCOPY WITH PROPOFOL;  Surgeon: Jonathon Bellows, MD;  Location: [facility] ENDOSCOPY;  Service: Gastroenterology;  Laterality: N/A;  . right toe bunionectomy    . right wrist ganglion cyst removal      Family History  Problem Relation Age of Onset  . Kidney disease Mother 65       ESKD, partial neprhectomy  . COPD Mother   . Heart disease Father        CABG at age 43  . Alcohol abuse Father        until late 24  . Diabetes Father   . Hypertension Father   . Diabetes Sister   . Cancer Sister        uterine ca  . Cancer Maternal Uncle 60       stage 4 colon CA  . Colon cancer Maternal Uncle   . Esophageal cancer Neg Hx   . Pancreatic cancer Neg Hx   . Rectal cancer Neg Hx   . Stomach cancer Neg Hx   . Breast cancer Neg Hx    Social History   Tobacco Use  . Smoking status: Current Every Day Smoker  Packs/day: 0.50    Years: 21.00    Pack years: 10.50    Types: Cigarettes  . Smokeless tobacco: Never Used  Substance Use Topics  . Alcohol use: Yes    Comment: occasionally    Current Outpatient Medications:  .  celecoxib (CELEBREX) 200 MG capsule, Take 1 capsule (200 mg total) by mouth daily., Disp: 30 capsule, Rfl: 2 .  citalopram (CELEXA) 20 MG tablet, Take 1 tablet (20 mg total) by mouth daily., Disp: 90 tablet, Rfl: 1 .  cyclobenzaprine (FLEXERIL) 5 MG tablet, Take 1 tablet (5 mg total) by mouth 3 (three) times daily as needed for muscle spasms., Disp: 30 tablet, Rfl: 1 .  isosorbide mononitrate (IMDUR) 30 MG 24 hr tablet, Take 1 tablet by mouth once daily, Disp: 90 tablet, Rfl: 1 .  metoprolol succinate (TOPROL-XL) 50 MG 24 hr tablet, TAKE 1 TABLET  BY MOUTH ONCE DAILY, Disp: 90 tablet, Rfl: 0 .  omeprazole (PRILOSEC) 40 MG capsule, Take 1 capsule (40 mg total) by mouth daily. (Patient taking differently: Take 40 mg by mouth at bedtime. ), Disp: 90 capsule, Rfl: 1  EXAM:  GENERAL: alert, oriented, appears well and in no acute distress  HEENT: atraumatic, conjunttiva clear, no obvious abnormalities on inspection of external nose and ears. Does sound congested when speaking, sniffing and clearing throat.   NECK: normal movements of the head and neck  LUNGS: on inspection no signs of respiratory distress, breathing rate appears normal, no obvious gross SOB, gasping or wheezing  CV: no obvious cyanosis  MS: moves all visible extremities without noticeable abnormality  PSYCH/NEURO: pleasant and cooperative, no obvious depression or anxiety, speech and thought processing grossly intact  ASSESSMENT AND PLAN:  Discussed the following assessment and plan:  Acute sinusitis, recurrence not specified, unspecified location - Plan: amoxicillin-clavulanate (AUGMENTIN) 875-125 MG tablet, fluticasone (FLONASE) 50 MCG/ACT nasal spray  Post-nasal drip  Cough  Due to patient's symptoms I do strongly suspect she has a sinusitis infection.  She will take Augmentin twice daily for 10 days, advised to also continue loratadine daily and begin using Flonase nasal spray to help open up sinuses reduce congestion.  Advised to rest, increase fluids and do good handwashing.  I do not have strong suspicion for COVID-19, but due to patient's sinus symptoms, we will take her out of work for the rest of this week so she will complete a 14-day timeframe of being ill and be able to monitor herself for any worsening of symptoms, development of fever.  If patient does not develop any new symptoms and improved throughout the rest of this week, she is cleared to return to work on Monday, 10/21/2018.   I discussed the assessment and treatment plan with the patient. The  patient was provided an opportunity to ask questions and all were answered. The patient agreed with the plan and demonstrated an understanding of the instructions.   The patient was advised to call back or seek an in-person evaluation if the symptoms worsen or if the condition fails to improve as anticipated.   Jodelle Green, FNP

## 2018-11-09 ENCOUNTER — Encounter: Payer: Self-pay | Admitting: Family Medicine

## 2018-11-12 NOTE — Telephone Encounter (Signed)
Would patient be willing to do another virtual visit/phone call visit for her symptoms?  Seen for similar issue 1 month ago  Thanks  LG

## 2018-11-12 NOTE — Telephone Encounter (Signed)
Called and scheduled Pt for Doxy 11/14/2018 @ 8:20am

## 2018-11-14 ENCOUNTER — Encounter (INDEPENDENT_AMBULATORY_CARE_PROVIDER_SITE_OTHER): Payer: Self-pay

## 2018-11-14 ENCOUNTER — Other Ambulatory Visit: Payer: Self-pay

## 2018-11-14 ENCOUNTER — Telehealth: Payer: Self-pay | Admitting: Family Medicine

## 2018-11-14 ENCOUNTER — Encounter: Payer: Self-pay | Admitting: Family Medicine

## 2018-11-14 ENCOUNTER — Ambulatory Visit (INDEPENDENT_AMBULATORY_CARE_PROVIDER_SITE_OTHER): Payer: BC Managed Care – PPO | Admitting: Family Medicine

## 2018-11-14 DIAGNOSIS — Z20822 Contact with and (suspected) exposure to covid-19: Secondary | ICD-10-CM

## 2018-11-14 DIAGNOSIS — J019 Acute sinusitis, unspecified: Secondary | ICD-10-CM

## 2018-11-14 DIAGNOSIS — R6889 Other general symptoms and signs: Secondary | ICD-10-CM

## 2018-11-14 DIAGNOSIS — J3489 Other specified disorders of nose and nasal sinuses: Secondary | ICD-10-CM

## 2018-11-14 DIAGNOSIS — R05 Cough: Secondary | ICD-10-CM | POA: Diagnosis not present

## 2018-11-14 DIAGNOSIS — R059 Cough, unspecified: Secondary | ICD-10-CM

## 2018-11-14 MED ORDER — DOXYCYCLINE HYCLATE 100 MG PO TABS: 100.0000 mg | ORAL_TABLET | Freq: Two times a day (BID) | ORAL | 0 refills | Status: DC

## 2018-11-14 NOTE — Telephone Encounter (Signed)
Melanie Fisher  08/06/2018  [id number]  Reason for COVID 19 test: cough, sinus pain/pressure, headache  [employer]; [phone]

## 2018-11-14 NOTE — Progress Notes (Signed)
Patient ID: Melanie Fisher, female   DOB: 1966-01-04, 53 y.o.   MRN: [id number]    Virtual Visit via video Note  This visit type was conducted due to national recommendations for restrictions regarding the COVID-19 pandemic (e.g. social distancing).  This format is felt to be most appropriate for this patient at this time.  All issues noted in this document were discussed and addressed.  No physical exam was performed (except for noted visual exam findings with Video Visits).   I connected with Melanie Fisher today at  8:20 AM EDT by a video enabled telemedicine application and verified that I am speaking with the correct person using two identifiers. Location patient: home Location provider: [facility] Persons participating in the virtual visit: patient, provider  I discussed the limitations, risks, security and privacy concerns of performing an evaluation and management service by video and the availability of in person appointments. I also discussed with the patient that there may be a patient responsible charge related to this service. The patient expressed understanding and agreed to proceed.  HPI:  Patient and I connected via video due to complaints of sinus pressure and congestion, headache, pain above teeth cough and sneezing.  Patient states symptoms first began with a sneezing fit that seem to last "an hour" about 1 week ago.  States she can feel the pressure in her face and drainage down back of her throat causing a little bit of a dry cough and tickle.  She has been using daily Claritin and Flonase as recommended previously.  She was treated for sinus infection in April 2020, states she did feel better for short time after that but then symptoms seem to return.  Denies fever or chills.  Denies shortness of breath or wheezing.  Denies chest pain.  Denies GI or GU complaints.  Denies body aches.   ROS: See pertinent positives and negatives per HPI.  Past Medical  History:  Diagnosis Date  . Allergy   . Anxiety   . Asthma   . Depression   . GERD (gastroesophageal reflux disease)   . Heart murmur   . Hyperlipidemia   . Hypertension   . Sleep apnea   . Tobacco abuse     Past Surgical History:  Procedure Laterality Date  . ABDOMINAL HYSTERECTOMY  2008   partial  . COLONOSCOPY WITH PROPOFOL N/A 06/04/2018   Procedure: COLONOSCOPY WITH PROPOFOL;  Surgeon: Jonathon Bellows, MD;  Location: [facility] ENDOSCOPY;  Service: Gastroenterology;  Laterality: N/A;  . right toe bunionectomy    . right wrist ganglion cyst removal      Family History  Problem Relation Age of Onset  . Kidney disease Mother 28       ESKD, partial neprhectomy  . COPD Mother   . Heart disease Father        CABG at age 70  . Alcohol abuse Father        until late 22  . Diabetes Father   . Hypertension Father   . Diabetes Sister   . Cancer Sister        uterine ca  . Cancer Maternal Uncle 60       stage 4 colon CA  . Colon cancer Maternal Uncle   . Esophageal cancer Neg Hx   . Pancreatic cancer Neg Hx   . Rectal cancer Neg Hx   . Stomach cancer Neg Hx   . Breast cancer Neg Hx    Social History   Tobacco  Use  . Smoking status: Current Every Day Smoker    Packs/day: 0.50    Years: 21.00    Pack years: 10.50    Types: Cigarettes  . Smokeless tobacco: Never Used  Substance Use Topics  . Alcohol use: Yes    Comment: occasionally    Current Outpatient Medications:  .  amoxicillin-clavulanate (AUGMENTIN) 875-125 MG tablet, Take 1 tablet by mouth 2 (two) times daily., Disp: 20 tablet, Rfl: 0 .  celecoxib (CELEBREX) 200 MG capsule, Take 1 capsule (200 mg total) by mouth daily., Disp: 30 capsule, Rfl: 2 .  citalopram (CELEXA) 20 MG tablet, Take 1 tablet (20 mg total) by mouth daily., Disp: 90 tablet, Rfl: 1 .  cyclobenzaprine (FLEXERIL) 5 MG tablet, Take 1 tablet (5 mg total) by mouth 3 (three) times daily as needed for muscle spasms., Disp: 30 tablet, Rfl: 1 .   fluticasone (FLONASE) 50 MCG/ACT nasal spray, Place 2 sprays into both nostrils daily., Disp: 16 g, Rfl: 6 .  isosorbide mononitrate (IMDUR) 30 MG 24 hr tablet, Take 1 tablet by mouth once daily, Disp: 90 tablet, Rfl: 1 .  metoprolol succinate (TOPROL-XL) 50 MG 24 hr tablet, TAKE 1 TABLET BY MOUTH ONCE DAILY, Disp: 90 tablet, Rfl: 0 .  omeprazole (PRILOSEC) 40 MG capsule, Take 1 capsule (40 mg total) by mouth daily. (Patient taking differently: Take 40 mg by mouth at bedtime. ), Disp: 90 capsule, Rfl: 1  EXAM:  GENERAL: alert, oriented, appears well and in no acute distress  HEENT: atraumatic, conjunttiva clear, no obvious abnormalities on inspection of external nose and ears  NECK: normal movements of the head and neck  LUNGS: on inspection no signs of respiratory distress, breathing rate appears normal, no obvious gross SOB, gasping or wheezing  CV: no obvious cyanosis  MS: moves all visible extremities without noticeable abnormality  PSYCH/NEURO: pleasant and cooperative, no obvious depression or anxiety, speech and thought processing grossly intact  ASSESSMENT AND PLAN:  Discussed the following assessment and plan:  Acute sinusitis, recurrence not specified, unspecified location - Plan: doxycycline (VIBRA-TABS) 100 MG tablet  Cough - Plan: MYCHART COVID-19 HOME MONITORING PROGRAM, Temperature monitoring  Sinus pain - Plan: doxycycline (VIBRA-TABS) 100 MG tablet, MYCHART COVID-19 HOME MONITORING PROGRAM, Temperature monitoring  Suspected Covid-19 Virus Infection  Patient appears to be in no distress over her video call.  Due to her symptoms of cough and sinus congestion, she potentially could have COVID-19.  We will set her up for testing.  Her symptoms also do seem consistent with a sinus infection, she does have a history of sinus infections, so we will also treat with doxycycline twice daily and she has been advised to continue Flonase and Claritin to reduce nasal congestion.   Advised she can use Tylenol as needed for any aches or pains.  Advised to remain home on self quarantine until we have results of COVID-19 testing and also until her symptoms have improved.  Advised to wipe down commonly touched surfaces in home, do diligent handwashing and monitor cell for any worsening of symptoms.  Advised to call office right away if symptoms become worse or new symptoms develop.   I discussed the assessment and treatment plan with the patient. The patient was provided an opportunity to ask questions and all were answered. The patient agreed with the plan and demonstrated an understanding of the instructions.   The patient was advised to call back or seek an in-person evaluation if the symptoms worsen or if the  condition fails to improve as anticipated.   Jodelle Green, FNP

## 2018-11-14 NOTE — Telephone Encounter (Signed)
Pt has been scheduled for Covid-19 testing. Pt was referred by Philis Nettle.. Scheduled with pt at home POF.

## 2018-11-14 NOTE — Addendum Note (Signed)
Addended by: Denman George on: 11/14/2018 09:15 AM   Modules accepted: Orders

## 2018-11-15 ENCOUNTER — Encounter (INDEPENDENT_AMBULATORY_CARE_PROVIDER_SITE_OTHER): Payer: Self-pay

## 2018-11-15 LAB — NOVEL CORONAVIRUS, NAA: SARS-CoV-2, NAA: NOT DETECTED

## 2018-11-16 ENCOUNTER — Encounter (INDEPENDENT_AMBULATORY_CARE_PROVIDER_SITE_OTHER): Payer: Self-pay

## 2018-11-16 ENCOUNTER — Encounter: Payer: Self-pay | Admitting: Family Medicine

## 2018-11-17 ENCOUNTER — Encounter (INDEPENDENT_AMBULATORY_CARE_PROVIDER_SITE_OTHER): Payer: Self-pay

## 2018-11-18 ENCOUNTER — Telehealth: Payer: Self-pay | Admitting: Internal Medicine

## 2018-11-18 NOTE — Telephone Encounter (Signed)
Please call to discuss symptoms  If still not feeling well -- should remain out of work  Let me know  LG

## 2018-11-18 NOTE — Telephone Encounter (Signed)
Pt given results and documented in result note 

## 2018-11-18 NOTE — Progress Notes (Unsigned)
Updated letter for work

## 2018-11-18 NOTE — Telephone Encounter (Signed)
Copied from [place] [id number]. Topic: Quick Communication - Lab Results (Clinic Use ONLY) >> Nov 18, 2018  8:11 AM Neta Ehlers, RMA wrote: Called patient to inform them of 11/14/2018 lab results. When patient returns call, triage nurse may disclose results. >> Nov 18, 2018  8:28 AM Margot Ables wrote: Pt called back. On hold 8 min. Pt requesting call back for results on cell #.

## 2018-11-19 ENCOUNTER — Encounter (INDEPENDENT_AMBULATORY_CARE_PROVIDER_SITE_OTHER): Payer: Self-pay

## 2018-11-20 ENCOUNTER — Telehealth: Payer: Self-pay

## 2018-11-20 ENCOUNTER — Encounter (INDEPENDENT_AMBULATORY_CARE_PROVIDER_SITE_OTHER): Payer: Self-pay

## 2018-11-20 NOTE — Telephone Encounter (Signed)
I did not take this patient out of work so I cannot write a letter to put her back to work,  She saw Tiffany Kocher.  Please check with patients about who they saw  Or look in the chart. Before sending to the default provider     Routing to [employer]

## 2018-11-20 NOTE — Telephone Encounter (Signed)
Copied from [place] [id number]. Topic: General - Other >> Nov 20, 2018  2:33 PM Pauline Good wrote: Reason for CRM: pt need a return to work note to stated she can return to work 6.4.20 with no restrictions. Please call pt when ready and send to [facility],

## 2018-11-21 ENCOUNTER — Encounter (INDEPENDENT_AMBULATORY_CARE_PROVIDER_SITE_OTHER): Payer: Self-pay

## 2018-11-21 NOTE — Telephone Encounter (Signed)
Called Pt and she stated she is feeling much better,  just have a cough still. Pt stated she is ready to go back to work tomorrow, if possible for a work note in her MyChart account so she send it to her job.

## 2018-11-21 NOTE — Telephone Encounter (Signed)
Please follow up with patient today to see how she is feeling. Her COVID test was negative, but still was not feeling well to return to work  IF better, then we can release back to work

## 2018-11-21 NOTE — Telephone Encounter (Signed)
Called Pt and told her that her work note was in her 69

## 2018-11-21 NOTE — Telephone Encounter (Signed)
Work note to return tomorrow released to her [employer]

## 2018-12-02 ENCOUNTER — Other Ambulatory Visit: Payer: Self-pay

## 2018-12-02 ENCOUNTER — Other Ambulatory Visit: Payer: Self-pay | Admitting: Internal Medicine

## 2018-12-02 DIAGNOSIS — I10 Essential (primary) hypertension: Secondary | ICD-10-CM

## 2018-12-12 ENCOUNTER — Ambulatory Visit: Payer: Self-pay | Admitting: Hematology

## 2018-12-12 NOTE — Telephone Encounter (Signed)
Patient found a tick on her on Tuesday.  She worked outside on Monday and that is how she thinks she got it on her. / Tick was up between her shoulder blades.  Tick has been removed.  It was red at first.  Now there is a dollar size area that has a black dot in the middle. / No fever, just itches in the area and has black spot in the middle. Patient states the black spot is not a scab. / Per triage, patient needs an appointment. Called the office and spoke let them know patient needs an appointment.  Conferenced in the patient. Reason for Disposition . [1] Red or very tender (to touch) area AND [2] started over 24 hours after the bite  Answer Assessment - Initial Assessment Questions 1. TYPE of TICK: "Is it a wood tick or a deer tick?" If unsure, ask: "What size was the tick?" "Did it look more like a watermelon seed or a poppy seed?"      Small in size, looks more like a water melon seed 2. LOCATION: "Where is the tick bite located?"     In between shoulder blades 3. ONSET: "How long do you think the tick was attached before you removed it?" (Hours or days)     approx 1 day  4. TETANUS: "When was the last tetanus booster?"     Yes, but unsure of the day  Protocols used: TICK BITE-A-AH

## 2018-12-13 ENCOUNTER — Other Ambulatory Visit: Payer: Self-pay

## 2018-12-13 ENCOUNTER — Ambulatory Visit (INDEPENDENT_AMBULATORY_CARE_PROVIDER_SITE_OTHER): Payer: BC Managed Care – PPO | Admitting: Family Medicine

## 2018-12-13 DIAGNOSIS — W57XXXA Bitten or stung by nonvenomous insect and other nonvenomous arthropods, initial encounter: Secondary | ICD-10-CM

## 2018-12-13 DIAGNOSIS — S30860A Insect bite (nonvenomous) of lower back and pelvis, initial encounter: Secondary | ICD-10-CM | POA: Diagnosis not present

## 2018-12-13 MED ORDER — DOXYCYCLINE HYCLATE 100 MG PO TABS: 100.0000 mg | ORAL_TABLET | Freq: Two times a day (BID) | ORAL | 0 refills | Status: DC

## 2018-12-13 NOTE — Progress Notes (Signed)
Patient ID: Melanie Fisher, female   DOB: [birth year], 53 y.o.   MRN: [id number]    Virtual Visit via video/phone Note  This visit type was conducted due to national recommendations for restrictions regarding the COVID-19 pandemic (e.g. social distancing).  This format is felt to be most appropriate for this patient at this time.  All issues noted in this document were discussed and addressed.  No physical exam was performed (except for noted visual exam findings with Video Visits).   I connected with Berton Mount today at  8:20 AM EDT by a video enabled telemedicine application/telephone and verified that I am speaking with the correct person using two identifiers. Location patient: home Location provider: work or home office Persons participating in the virtual visit: patient, provider  I discussed the limitations, risks, security and privacy concerns of performing an evaluation and management service by telephone and the availability of in person appointments. I also discussed with the patient that there may be a patient responsible charge related to this service. The patient expressed understanding and agreed to proceed.  Interactive audio and video telecommunications were attempted between this provider and patient, however failed, due to having technical difficulties. We continued and completed visit with audio only.   HPI: Patient and I attempted connection via video but were unable to connect due to website/video issues so we finished visit over the telephone.  Patient complains of a tick bite on right upper back that she noticed on Tuesday.  States she was outdoors for many hours with her son in their front yard working on his car, and believes this is where she got tick bite.  States the following morning after being outdoors all day with son she noticed an itching on her upper back and had family member look at her back.  Daughter states it was a tick and was able to  successfully remove the tick.  Patient now has a red mark on upper back that is approximately size of a silver dollar with scabbed area in center.  Patient states area is a little tender, warm to touch and per daughter that there is some pus draining from the center.  Patient denies any fever or chills.  Denies nausea, vomiting or diarrhea.  Per daughter the redness on back does not appear like a bull's-eye.  No red streaking.  No cough, shortness breath or wheezing.  No body aches.  No chest pain.   ROS: See pertinent positives and negatives per HPI.  Past Medical History:  Diagnosis Date  . Allergy   . Anxiety   . Asthma   . Depression   . GERD (gastroesophageal reflux disease)   . Heart murmur   . Hyperlipidemia   . Hypertension   . Sleep apnea   . Tobacco abuse     Past Surgical History:  Procedure Laterality Date  . ABDOMINAL HYSTERECTOMY  2008   partial  . COLONOSCOPY WITH PROPOFOL N/A 06/04/2018   Procedure: COLONOSCOPY WITH PROPOFOL;  Surgeon: Jonathon Bellows, MD;  Location: [facility] ENDOSCOPY;  Service: Gastroenterology;  Laterality: N/A;  . right toe bunionectomy    . right wrist ganglion cyst removal      Family History  Problem Relation Age of Onset  . Kidney disease Mother 39       ESKD, partial neprhectomy  . COPD Mother   . Heart disease Father        CABG at age 15  . Alcohol abuse Father  until late 20  . Diabetes Father   . Hypertension Father   . Diabetes Sister   . Cancer Sister        uterine ca  . Cancer Maternal Uncle 60       stage 4 colon CA  . Colon cancer Maternal Uncle   . Esophageal cancer Neg Hx   . Pancreatic cancer Neg Hx   . Rectal cancer Neg Hx   . Stomach cancer Neg Hx   . Breast cancer Neg Hx    Social History   Tobacco Use  . Smoking status: Current Every Day Smoker    Packs/day: 0.50    Years: 21.00    Pack years: 10.50    Types: Cigarettes  . Smokeless tobacco: Never Used  Substance Use Topics  . Alcohol use: Yes     Comment: occasionally    Current Outpatient Medications:  .  celecoxib (CELEBREX) 200 MG capsule, Take 1 capsule by mouth once daily, Disp: 90 capsule, Rfl: 0 .  citalopram (CELEXA) 20 MG tablet, Take 1 tablet (20 mg total) by mouth daily., Disp: 90 tablet, Rfl: 1 .  cyclobenzaprine (FLEXERIL) 5 MG tablet, Take 1 tablet (5 mg total) by mouth 3 (three) times daily as needed for muscle spasms., Disp: 30 tablet, Rfl: 1 .  doxycycline (VIBRA-TABS) 100 MG tablet, Take 1 tablet (100 mg total) by mouth 2 (two) times daily., Disp: 20 tablet, Rfl: 0 .  fluticasone (FLONASE) 50 MCG/ACT nasal spray, Place 2 sprays into both nostrils daily., Disp: 16 g, Rfl: 6 .  isosorbide mononitrate (IMDUR) 30 MG 24 hr tablet, Take 1 tablet by mouth once daily, Disp: 90 tablet, Rfl: 1 .  metoprolol succinate (TOPROL-XL) 50 MG 24 hr tablet, Take 1 tablet by mouth once daily, Disp: 90 tablet, Rfl: 0 .  omeprazole (PRILOSEC) 40 MG capsule, Take 1 capsule (40 mg total) by mouth daily. (Patient taking differently: Take 40 mg by mouth at bedtime. ), Disp: 90 capsule, Rfl: 1  EXAM:  GENERAL: alert, oriented, sounds well and in no acute distress  LUNGS: Speaking in full sentences, on inspection no signs of respiratory distress, breathing rate appears normal, no obvious gross SOB, gasping, coughing or wheezing  PSYCH/NEURO: pleasant and cooperative, no obvious depression or anxiety, speech and thought processing grossly intact  ASSESSMENT AND PLAN:  Discussed the following assessment and plan:  Tick bite of back-due to tick being on patient for greater than 12 to 24 hours, we will treat with course of doxycycline.  Advised patient to monitor self for any worsening or changing symptoms including development of bull's-eye rash, worsening redness, worsening pus drainage, body aches, fever or chills and to call office right away if any new symptoms occur.  Discussed with patient that we do have the option of getting blood test  to check for antibodies of [facility] spotted fever and Lyme however we should not get these labs drawn until at least 2 weeks have passed to allow time for her body to create the antibodies to be able to be detected in the lab test.  Patient would like to have lab testing; I will order for her.   I discussed the assessment and treatment plan with the patient. The patient was provided an opportunity to ask questions and all were answered. The patient agreed with the plan and demonstrated an understanding of the instructions.   The patient was advised to call back or seek an in-person evaluation if the symptoms worsen  or if the condition fails to improve as anticipated.  I provided 15 minutes of non-face-to-face time during this encounter.   Jodelle Green, FNP

## 2019-01-24 ENCOUNTER — Encounter: Payer: Self-pay | Admitting: Emergency Medicine

## 2019-01-24 ENCOUNTER — Emergency Department
Admission: EM | Admit: 2019-01-24 | Discharge: 2019-01-24 | Disposition: A | Discharge status: Home / Self Care | Payer: BC Managed Care – PPO | Attending: Emergency Medicine | Admitting: Emergency Medicine

## 2019-01-24 ENCOUNTER — Other Ambulatory Visit: Payer: Self-pay

## 2019-01-24 DIAGNOSIS — F17210 Nicotine dependence, cigarettes, uncomplicated: Secondary | ICD-10-CM | POA: Diagnosis not present

## 2019-01-24 DIAGNOSIS — J45909 Unspecified asthma, uncomplicated: Secondary | ICD-10-CM | POA: Diagnosis not present

## 2019-01-24 DIAGNOSIS — R51 Headache: Secondary | ICD-10-CM | POA: Diagnosis not present

## 2019-01-24 DIAGNOSIS — J449 Chronic obstructive pulmonary disease, unspecified: Secondary | ICD-10-CM | POA: Diagnosis not present

## 2019-01-24 DIAGNOSIS — I10 Essential (primary) hypertension: Secondary | ICD-10-CM | POA: Insufficient documentation

## 2019-01-24 DIAGNOSIS — Z79899 Other long term (current) drug therapy: Secondary | ICD-10-CM | POA: Insufficient documentation

## 2019-01-24 MED ORDER — AMLODIPINE BESYLATE 5 MG PO TABS: 5.0000 mg | ORAL_TABLET | Freq: Every day | ORAL | 1 refills | Status: DC

## 2019-01-24 NOTE — ED Provider Notes (Signed)
[facility] Emergency Department Provider Note  ____________________________________________  Time seen: Approximately 8:19 AM  I have reviewed the triage vital signs and the nursing notes.   HISTORY  Chief Complaint Hypertension    HPI Melanie Fisher is a 53 y.o. female with a history of hypertension hyperlipidemia GERD and depression who comes the ED complaining of elevated blood pressure for the past 10 days, ranging from 1 95-6 80 systolic and 21-3 10 diastolic.  Denies any acute symptoms with it such as headache vision changes numbness tingling weakness chest pain shortness of breath dizziness or syncope.  She reports a occasional slight headache that she resolves with Tylenol.  She takes metoprolol and Imdur but no other particularly vasoactive medications.      Past Medical History:  Diagnosis Date  . Allergy   . Anxiety   . Asthma   . Depression   . GERD (gastroesophageal reflux disease)   . Heart murmur   . Hyperlipidemia   . Hypertension   . Sleep apnea   . Tobacco abuse      Patient Active Problem List   Diagnosis Date Noted  . Candida vaginitis 05/22/2018  . Widowed 05/21/2018  . Tubular adenoma of colon 05/21/2018  . B12 deficiency 05/21/2018  . COPD exacerbation ([place]) 12/15/2016  . Insomnia due to anxiety and fear 04/10/2015  . Polyneuropathy 02/10/2015  . Cervical disc disorder with radiculopathy of cervical region 12/25/2014  . Essential hypertension 08/19/2014  . Slipped rib syndrome 03/23/2014  . Degenerative disc disease, thoracic 03/23/2014  . Nonallopathic lesion of thoracic region 03/23/2014  . Nonallopathic lesion-rib cage 03/23/2014  . Degenerative disk disease 02/12/2014  . Personal history of colonic polyps 02/11/2014  . Other chest pain 11/18/2013  . Obesity 07/01/2013  . Tobacco abuse counseling 03/15/2013  . Rash of unknown cause 02/18/2013  . Routine general medical examination at a health care facility  09/04/2012  . COPD (chronic obstructive pulmonary disease) ([place]) 05/26/2012  . Chronic shoulder pain 04/09/2012  . Generalized anxiety disorder 04/06/2012  . Tobacco abuse 04/06/2012  . Depression with anxiety   . Heart murmur   . Hyperlipidemia   . Allergy      Past Surgical History:  Procedure Laterality Date  . ABDOMINAL HYSTERECTOMY  2008   partial  . COLONOSCOPY WITH PROPOFOL N/A 06/04/2018   Procedure: COLONOSCOPY WITH PROPOFOL;  Surgeon: Jonathon Bellows, MD;  Location: [facility] ENDOSCOPY;  Service: Gastroenterology;  Laterality: N/A;  . right toe bunionectomy    . right wrist ganglion cyst removal       Prior to Admission medications   Medication Sig Start Date End Date Taking? Authorizing Provider  amLODipine (NORVASC) 5 MG tablet Take 1 tablet (5 mg total) by mouth daily. 01/24/19   Carrie Mew, MD  celecoxib (CELEBREX) 200 MG capsule Take 1 capsule by mouth once daily 12/03/18   Crecencio Mc, MD  citalopram (CELEXA) 20 MG tablet Take 1 tablet (20 mg total) by mouth daily. 09/23/18   Crecencio Mc, MD  cyclobenzaprine (FLEXERIL) 5 MG tablet Take 1 tablet (5 mg total) by mouth 3 (three) times daily as needed for muscle spasms. 03/27/18   Jodelle Green, FNP  doxycycline (VIBRA-TABS) 100 MG tablet Take 1 tablet (100 mg total) by mouth 2 (two) times daily. 12/13/18   Jodelle Green, FNP  fluticasone (FLONASE) 50 MCG/ACT nasal spray Place 2 sprays into both nostrils daily. 10/15/18   Jodelle Green, FNP  isosorbide mononitrate (  IMDUR) 30 MG 24 hr tablet Take 1 tablet by mouth once daily 09/18/18   Crecencio Mc, MD  metoprolol succinate (TOPROL-XL) 50 MG 24 hr tablet Take 1 tablet by mouth once daily 12/03/18   Crecencio Mc, MD  omeprazole (PRILOSEC) 40 MG capsule Take 1 capsule (40 mg total) by mouth daily. Patient taking differently: Take 40 mg by mouth at bedtime.  03/27/18   Jodelle Green, FNP     Allergies Patient has no known allergies.   Family History  Problem  Relation Age of Onset  . Kidney disease Mother 106       ESKD, partial neprhectomy  . COPD Mother   . Heart disease Father        CABG at age 8  . Alcohol abuse Father        until late 68  . Diabetes Father   . Hypertension Father   . Diabetes Sister   . Cancer Sister        uterine ca  . Cancer Maternal Uncle 60       stage 4 colon CA  . Colon cancer Maternal Uncle   . Esophageal cancer Neg Hx   . Pancreatic cancer Neg Hx   . Rectal cancer Neg Hx   . Stomach cancer Neg Hx   . Breast cancer Neg Hx     Social History Social History   Tobacco Use  . Smoking status: Current Every Day Smoker    Packs/day: 0.50    Years: 21.00    Pack years: 10.50    Types: Cigarettes  . Smokeless tobacco: Never Used  Substance Use Topics  . Alcohol use: Yes    Comment: occasionally  . Drug use: No    Review of Systems  Constitutional:   No fever or chills.  ENT:   No sore throat. No rhinorrhea. Cardiovascular:   No chest pain or syncope. Respiratory:   No dyspnea or cough. Gastrointestinal:   Negative for abdominal pain, vomiting and diarrhea.  Musculoskeletal:   Negative for focal pain or swelling All other systems reviewed and are negative except as documented above in ROS and HPI.  ____________________________________________   PHYSICAL EXAM:  VITAL SIGNS: ED Triage Vitals [01/24/19 0759]  Enc Vitals Group     BP (!) 171/102     Pulse Rate 75     Resp 16     Temp 98.8 F (37.1 C)     Temp Source Oral     SpO2 97 %     Weight 190 lb (86.2 kg)     Height 5\' 4"  (1.626 m)     Head Circumference      Peak Flow      Pain Score 0     Pain Loc      Pain Edu?      Excl. in [place]?     Vital signs reviewed, nursing assessments reviewed.   Constitutional:   Alert and oriented. Non-toxic appearance. Eyes:   Conjunctivae are normal. EOMI. PERRL.  No APD ENT      Head:   Normocephalic and atraumatic.        Neck:   No meningismus. Full  ROM. Hematological/Lymphatic/Immunilogical:   No cervical lymphadenopathy. Cardiovascular:   RRR. Symmetric bilateral radial and DP pulses.  No murmurs. Cap refill less than 2 seconds. Respiratory:   Normal respiratory effort without tachypnea/retractions. Breath sounds are clear and equal bilaterally. No wheezes/rales/rhonchi. Musculoskeletal:   Normal range of motion  in all extremities. No joint effusions.  No lower extremity tenderness.  No edema. Neurologic:   Normal speech and language.  Motor grossly intact. No acute focal neurologic deficits are appreciated.  Skin:    Skin is warm, dry and intact. No rash noted.  No petechiae, purpura, or bullae.  ____________________________________________    LABS (pertinent positives/negatives) (all labs ordered are listed, but only abnormal results are displayed) Labs Reviewed - No data to display ____________________________________________   EKG    ____________________________________________    RADIOLOGY  No results found.  ____________________________________________   PROCEDURES Procedures  ____________________________________________    CLINICAL IMPRESSION / ASSESSMENT AND PLAN / ED COURSE  Medications ordered in the ED: Medications - No data to display  Pertinent labs & imaging results that were available during my care of the patient were reviewed by me and considered in my medical decision making (see chart for details).  Melanie Fisher was evaluated in Emergency Department on 01/24/2019 for the symptoms described in the history of present illness. She was evaluated in the context of the global COVID-19 pandemic, which necessitated consideration that the patient might be at risk for infection with the SARS-CoV-2 virus that causes COVID-19. Institutional protocols and algorithms that pertain to the evaluation of patients at risk for COVID-19 are in a state of rapid change based on information released by  regulatory bodies including the CDC and federal and state organizations. These policies and algorithms were followed during the patient's care in the ED.   Patient is nontoxic and well-appearing, presents for elevated blood pressure with no specific symptoms.  Vital signs today are normal except for a blood pressure of 170/100.  With this degree of hypertension I will need to start an additional antihypertensive and will use amlodipine 5 mg daily.  Counseled her that she needs to follow-up with primary care in 1 week for blood pressure recheck and medication titration.    Her slight headaches appear to be inconsequential and not related to any intracranial hemorrhage stroke meningitis encephalitis glaucoma intracranial hypertension or other acute pathology.      ____________________________________________   FINAL CLINICAL IMPRESSION(S) / ED DIAGNOSES    Final diagnoses:  Hypertension, unspecified type     ED Discharge Orders         Ordered    amLODipine (NORVASC) 5 MG tablet  Daily     01/24/19 0818          Portions of this note were generated with dragon dictation software. Dictation errors may occur despite best attempts at proofreading.   Carrie Mew, MD 01/24/19 [id number]

## 2019-01-24 NOTE — ED Notes (Signed)
ED Provider at bedside. 

## 2019-01-24 NOTE — ED Triage Notes (Signed)
Pt c/o HTN when she took BP this am. States it was 161/105. Hx of HTN , takes meds. PT denies any dizziness. NAD noted

## 2019-01-24 NOTE — ED Notes (Signed)
Pt states that she is in no current pain other than an occasional HA that is relieved by Tylenol. Here today because she cant get her DBP to go down. Been in the (110's and up) for the last 2 weeks. Currently taking Metoprolol and Isorbiside for her BP. Pt in NAD at this time

## 2019-02-20 ENCOUNTER — Other Ambulatory Visit: Payer: Self-pay | Admitting: Internal Medicine

## 2019-02-20 DIAGNOSIS — I10 Essential (primary) hypertension: Secondary | ICD-10-CM

## 2019-02-20 DIAGNOSIS — E7800 Pure hypercholesterolemia, unspecified: Secondary | ICD-10-CM

## 2019-02-20 NOTE — Progress Notes (Unsigned)
mic

## 2019-02-21 ENCOUNTER — Ambulatory Visit: Payer: BC Managed Care – PPO | Admitting: Internal Medicine

## 2019-02-21 ENCOUNTER — Encounter: Payer: Self-pay | Admitting: Internal Medicine

## 2019-02-21 ENCOUNTER — Other Ambulatory Visit: Payer: Self-pay

## 2019-02-21 VITALS — BP 142/70 | HR 73 | Temp 97.9°F | Resp 15 | Ht 64.0 in | Wt 195.8 lb

## 2019-02-21 DIAGNOSIS — E538 Deficiency of other specified B group vitamins: Secondary | ICD-10-CM | POA: Diagnosis not present

## 2019-02-21 DIAGNOSIS — Z720 Tobacco use: Secondary | ICD-10-CM

## 2019-02-21 DIAGNOSIS — R7303 Prediabetes: Secondary | ICD-10-CM | POA: Diagnosis not present

## 2019-02-21 DIAGNOSIS — Z716 Tobacco abuse counseling: Secondary | ICD-10-CM

## 2019-02-21 DIAGNOSIS — E7800 Pure hypercholesterolemia, unspecified: Secondary | ICD-10-CM

## 2019-02-21 DIAGNOSIS — Z23 Encounter for immunization: Secondary | ICD-10-CM

## 2019-02-21 DIAGNOSIS — I10 Essential (primary) hypertension: Secondary | ICD-10-CM

## 2019-02-21 NOTE — Patient Instructions (Addendum)
For your blood pressure, I want you to start taking some of your medications at night,   Starting tonight:  Take metoprolol tonight.   Tomorrow morning:  Take amlodipine only  Tomorrow night:  imdur and metorprolol  in the evening   Amlodipine continue taking every morning

## 2019-02-21 NOTE — Progress Notes (Signed)
Subjective:  Patient ID: Melanie Fisher, female    DOB: 20-May-1966  Age: 53 y.o. MRN: [id number]  CC: The primary encounter diagnosis was Tobacco abuse counseling. Diagnoses of Essential hypertension, Pure hypercholesterolemia, B12 deficiency, Prediabetes, Need for immunization against influenza, and Tobacco abuse were also pertinent to this visit.  HPI Melanie Fisher presents for ER follow up for hypertension .  Seen on August 7  For ten day history of elevated BPs ranging from 160  to 180/85 to 110.  Started checking her blood pressure due to recurrent dizzy spells l.    ER records reviewed. No imaging or labs were done.  Amlodipine 5 mg added to regimen of imdur and metoprolol all in the morning   She notes an improvement in her blood pressure and has not had any headaches and dizziness.   Tobacco abuse:  She continues to smoke cigarettes 1 pack daily . She is not interested in pharmacotherapy today to help her quit.    Outpatient Medications Prior to Visit  Medication Sig Dispense Refill  . amLODipine (NORVASC) 5 MG tablet Take 1 tablet (5 mg total) by mouth daily. 30 tablet 1  . celecoxib (CELEBREX) 200 MG capsule Take 1 capsule by mouth once daily 90 capsule 0  . citalopram (CELEXA) 20 MG tablet Take 1 tablet (20 mg total) by mouth daily. 90 tablet 1  . cyclobenzaprine (FLEXERIL) 5 MG tablet Take 1 tablet (5 mg total) by mouth 3 (three) times daily as needed for muscle spasms. 30 tablet 1  . fluticasone (FLONASE) 50 MCG/ACT nasal spray Place 2 sprays into both nostrils daily. 16 g 6  . isosorbide mononitrate (IMDUR) 30 MG 24 hr tablet Take 1 tablet by mouth once daily 90 tablet 1  . metoprolol succinate (TOPROL-XL) 50 MG 24 hr tablet Take 1 tablet by mouth once daily 90 tablet 0  . omeprazole (PRILOSEC) 40 MG capsule Take 1 capsule (40 mg total) by mouth daily. (Patient taking differently: Take 40 mg by mouth at bedtime. ) 90 capsule 1  . doxycycline (VIBRA-TABS) 100 MG  tablet Take 1 tablet (100 mg total) by mouth 2 (two) times daily. (Patient not taking: Reported on 02/21/2019) 42 tablet 0   No facility-administered medications prior to visit.     Review of Systems;  Patient denies headache, fevers, malaise, unintentional weight loss, skin rash, eye pain, sinus congestion and sinus pain, sore throat, dysphagia,  hemoptysis , cough, dyspnea, wheezing, chest pain, palpitations, orthopnea, edema, abdominal pain, nausea, melena, diarrhea, constipation, flank pain, dysuria, hematuria, urinary  Frequency, nocturia, numbness, tingling, seizures,  Focal weakness, Loss of consciousness,  Tremor, insomnia, depression, anxiety, and suicidal ideation.      Objective:  BP (!) 142/70 (BP Location: Left Arm, Patient Position: Sitting, Cuff Size: Large)   Pulse 73   Temp 97.9 F (36.6 C) (Temporal)   Resp 15   Ht 5\' 4"  (1.626 m)   Wt 195 lb 12.8 oz (88.8 kg)   SpO2 95%   BMI 33.61 kg/m   BP Readings from Last 3 Encounters:  02/21/19 (!) 142/70  01/24/19 (!) 171/102  06/14/18 120/82    Wt Readings from Last 3 Encounters:  02/21/19 195 lb 12.8 oz (88.8 kg)  01/24/19 190 lb (86.2 kg)  06/14/18 187 lb 12.8 oz (85.2 kg)    General appearance: alert, cooperative and appears stated age Ears: normal TM's and external ear canals both ears Throat: lips, mucosa, and tongue normal; teeth and gums normal  Neck: no adenopathy, no carotid bruit, supple, symmetrical, trachea midline and thyroid not enlarged, symmetric, no tenderness/mass/nodules Back: symmetric, no curvature. ROM normal. No CVA tenderness. Lungs: clear to auscultation bilaterally Heart: regular rate and rhythm, S1, S2 normal, no murmur, click, rub or gallop Abdomen: soft, non-tender; bowel sounds normal; no masses,  no organomegaly Pulses: 2+ and symmetric Skin: Skin color, texture, turgor normal. No rashes or lesions Lymph nodes: Cervical, supraclavicular, and axillary nodes normal.  Lab Results   Component Value Date   HGBA1C 5.2 02/21/2019   HGBA1C 5.8 05/20/2018   HGBA1C 5.6 05/02/2017    Lab Results  Component Value Date   CREATININE 0.94 02/21/2019   CREATININE 0.90 04/01/2018   CREATININE 0.86 03/27/2018    Lab Results  Component Value Date   WBC 4.8 04/01/2018   HGB 14.3 04/01/2018   HCT 41.6 04/01/2018   PLT 196 04/01/2018   GLUCOSE 102 (H) 02/21/2019   CHOL 207 (H) 02/21/2019   TRIG 74 02/21/2019   HDL 62 02/21/2019   LDLDIRECT 131.0 05/23/2016   LDLCALC 128 (H) 02/21/2019   ALT 17 02/21/2019   AST 16 02/21/2019   NA 135 02/21/2019   K 3.8 02/21/2019   CL 105 02/21/2019   CREATININE 0.94 02/21/2019   BUN 28 (H) 02/21/2019   CO2 18 (L) 02/21/2019   TSH 4.11 02/21/2019   HGBA1C 5.2 02/21/2019   MICROALBUR 0.4 02/21/2019    No results found.  Assessment & Plan:   Problem List Items Addressed This Visit      Unprioritized   Hyperlipidemia   Tobacco abuse counseling - Primary   B12 deficiency   Tobacco abuse    Briefly reviewed the risk of continued tobacco abuse, including but not limited to CAD, PAD, hypertension, and CA.  sHe is not interested in pharmacotherapy at this time.      Elevated blood pressure reading in office with white coat syndrome, with diagnosis of hypertension    Amlodipine added for improved control..  Will need to rule out secondary causes.        Other Visit Diagnoses    Prediabetes       Need for immunization against influenza       Relevant Orders   Flu Vaccine QUAD 36+ mos IM (Completed)      I have discontinued Mardene Celeste L. Huffines's doxycycline. I am also having her maintain her cyclobenzaprine, omeprazole, isosorbide mononitrate, citalopram, fluticasone, celecoxib, metoprolol succinate, and amLODipine.  No orders of the defined types were placed in this encounter.   Medications Discontinued During This Encounter  Medication Reason  . doxycycline (VIBRA-TABS) 100 MG tablet Patient has not taken in last 30  days    Follow-up: No follow-ups on file.   Crecencio Mc, MD

## 2019-02-21 NOTE — Addendum Note (Signed)
Addended by: Leeanne Rio on: 02/21/2019 04:11 PM   Modules accepted: Orders

## 2019-02-22 LAB — COMPREHENSIVE METABOLIC PANEL
AG Ratio: 1.6 (calc) (ref 1.0–2.5)
ALT: 17 U/L (ref 6–29)
AST: 16 U/L (ref 10–35)
Albumin: 4.3 g/dL (ref 3.6–5.1)
Alkaline phosphatase (APISO): 57 U/L (ref 37–153)
BUN/Creatinine Ratio: 30 (calc) — ABNORMAL HIGH (ref 6–22)
BUN: 28 mg/dL — ABNORMAL HIGH (ref 7–25)
CO2: 18 mmol/L — ABNORMAL LOW (ref 20–32)
Calcium: 9.5 mg/dL (ref 8.6–10.4)
Chloride: 105 mmol/L (ref 98–110)
Creat: 0.94 mg/dL (ref 0.50–1.05)
Globulin: 2.7 g/dL (calc) (ref 1.9–3.7)
Glucose, Bld: 102 mg/dL — ABNORMAL HIGH (ref 65–99)
Potassium: 3.8 mmol/L (ref 3.5–5.3)
Sodium: 135 mmol/L (ref 135–146)
Total Bilirubin: 0.4 mg/dL (ref 0.2–1.2)
Total Protein: 7 g/dL (ref 6.1–8.1)

## 2019-02-22 LAB — MICROALBUMIN / CREATININE URINE RATIO
Creatinine, Urine: 81 mg/dL (ref 20–275)
Microalb Creat Ratio: 5 mcg/mg creat (ref <30)
Microalb, Ur: 0.4 mg/dL

## 2019-02-22 LAB — LIPID PANEL
Cholesterol: 207 mg/dL — ABNORMAL HIGH (ref <200)
HDL: 62 mg/dL (ref >=50)
LDL Cholesterol (Calc): 128 mg/dL (calc) — ABNORMAL HIGH
Non-HDL Cholesterol (Calc): 145 mg/dL (calc) — ABNORMAL HIGH (ref <130)
Total CHOL/HDL Ratio: 3.3 (calc) (ref <5.0)
Triglycerides: 74 mg/dL (ref <150)

## 2019-02-22 LAB — VITAMIN B12: Vitamin B-12: 346 pg/mL (ref 200–1100)

## 2019-02-22 LAB — TSH: TSH: 4.11 mIU/L

## 2019-02-22 LAB — HEMOGLOBIN A1C
Hgb A1c MFr Bld: 5.2 % of total Hgb (ref <5.7)
Mean Plasma Glucose: 103 (calc)
eAG (mmol/L): 5.7 (calc)

## 2019-02-24 NOTE — Assessment & Plan Note (Signed)
Briefly reviewed the risk of continued tobacco abuse, including but not limited to CAD, PAD, hypertension, and CA.  sHe is not interested in pharmacotherapy at this time.

## 2019-02-24 NOTE — Assessment & Plan Note (Signed)
Amlodipine added for improved control..  Will need to rule out secondary causes.

## 2019-03-06 ENCOUNTER — Other Ambulatory Visit: Payer: Self-pay | Admitting: Internal Medicine

## 2019-03-06 ENCOUNTER — Other Ambulatory Visit: Payer: Self-pay | Admitting: Family Medicine

## 2019-03-06 DIAGNOSIS — K219 Gastro-esophageal reflux disease without esophagitis: Secondary | ICD-10-CM

## 2019-03-06 DIAGNOSIS — I10 Essential (primary) hypertension: Secondary | ICD-10-CM

## 2019-03-20 MED ORDER — ACYCLOVIR 400 MG PO TABS: 400.0000 mg | ORAL_TABLET | Freq: Every day | ORAL | 2 refills | Status: DC

## 2019-04-01 ENCOUNTER — Ambulatory Visit (INDEPENDENT_AMBULATORY_CARE_PROVIDER_SITE_OTHER): Payer: BC Managed Care – PPO | Admitting: Internal Medicine

## 2019-04-01 ENCOUNTER — Encounter: Payer: Self-pay | Admitting: Internal Medicine

## 2019-04-01 VITALS — Ht 64.0 in | Wt 195.8 lb

## 2019-04-01 DIAGNOSIS — M546 Pain in thoracic spine: Secondary | ICD-10-CM | POA: Diagnosis not present

## 2019-04-01 DIAGNOSIS — M999 Biomechanical lesion, unspecified: Secondary | ICD-10-CM | POA: Diagnosis not present

## 2019-04-01 DIAGNOSIS — M62830 Muscle spasm of back: Secondary | ICD-10-CM | POA: Diagnosis not present

## 2019-04-01 MED ORDER — CYCLOBENZAPRINE HCL 5 MG PO TABS: 5.0000 mg | ORAL_TABLET | Freq: Three times a day (TID) | ORAL | 3 refills | Status: DC | PRN

## 2019-04-01 NOTE — Assessment & Plan Note (Signed)
Referral to physical therapy for evaluation and treatment including TENS unit.  refilling flexeril

## 2019-04-01 NOTE — Progress Notes (Signed)
Virtual Visit via [email or web address]  This visit type was conducted due to national recommendations for restrictions regarding the COVID-19 pandemic (e.g. social distancing).  This format is felt to be most appropriate for this patient at this time.  All issues noted in this document were discussed and addressed.  No physical exam was performed (except for noted visual exam findings with Video Visits).   I connected with@ on 04/01/19 at  4:30 PM EDT by a video enabled telemedicine application and verified that I am speaking with the correct person using two identifiers. Location patient: home Location provider: work or home office Persons participating in the virtual visit: patient, provider  I discussed the limitations, risks, security and privacy concerns of performing an evaluation and management service by telephone and the availability of in person appointments. I also discussed with the patient that there may be a patient responsible charge related to this service. The patient expressed understanding and agreed to proceed.  Reason for visit: recurrent right sided thoracic back pain with spasm  HPI:  53 yr old female with degenerative disk disease involving the cervical and thoracic spine  Presents with recurrent back pain with muscle spasm since Sept 26.  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 Her pain started after taking a day or two off from work. [name] Has been taking celebrex and flexeril but ran out of flexeril and pian has been stabbing in nature. Non radiating.  Aggravated by lifting her right arm and by lying flat in bed.    ROS: See pertinent positives and negatives per HPI.  Past Medical History:  Diagnosis Date  . Allergy   . Anxiety   . Asthma   . Depression   . GERD (gastroesophageal reflux disease)   . Heart murmur   . Hyperlipidemia   . Hypertension   . Sleep apnea   .  Tobacco abuse     Past Surgical History:  Procedure Laterality Date  . ABDOMINAL HYSTERECTOMY  2008   partial  . COLONOSCOPY WITH PROPOFOL N/A 06/04/2018   Procedure: COLONOSCOPY WITH PROPOFOL;  Surgeon: Jonathon Bellows, MD;  Location: [facility] ENDOSCOPY;  Service: Gastroenterology;  Laterality: N/A;  . right toe bunionectomy    . right wrist ganglion cyst removal      Family History  Problem Relation Age of Onset  . Kidney disease Mother 72       ESKD, partial neprhectomy  . COPD Mother   . Heart disease Father        CABG at age 55  . Alcohol abuse Father        until late 91  . Diabetes Father   . Hypertension Father   . Diabetes Sister   . Cancer Sister        uterine ca  . Cancer Maternal Uncle 60       stage 4 colon CA  . Colon cancer Maternal Uncle   . Esophageal cancer Neg Hx   . Pancreatic cancer Neg Hx   . Rectal cancer Neg Hx   . Stomach cancer Neg Hx   . Breast cancer Neg Hx     SOCIAL HX:  reports that she has been smoking cigarettes. She has a 10.50 pack-year smoking history. She has never used smokeless tobacco. She reports current alcohol use. She reports that she does not use drugs.  Current Outpatient Medications:  .  acyclovir (ZOVIRAX) 400 MG tablet, Take 1 tablet (400 mg total) by mouth 5 (five) times daily., Disp: 35 tablet, Rfl: 2 .  amLODipine (NORVASC) 5 MG tablet, Take 1 tablet (5 mg total) by mouth daily., Disp: 30 tablet, Rfl: 1 .  celecoxib (CELEBREX) 200 MG capsule, Take 1 capsule by mouth once daily, Disp: 90 capsule, Rfl: 0 .  citalopram (CELEXA) 20 MG tablet, Take 1 tablet by mouth once daily, Disp: 90 tablet, Rfl: 0 .  cyclobenzaprine (FLEXERIL) 5 MG tablet, Take 1 tablet (5 mg total) by mouth 3 (three) times daily as needed for muscle spasms., Disp: 90 tablet, Rfl: 3 .  fluticasone (FLONASE) 50 MCG/ACT nasal spray, Place 2 sprays into both nostrils daily., Disp: 16 g, Rfl: 6 .  isosorbide mononitrate (IMDUR) 30 MG 24 hr tablet, Take 1 tablet  by mouth once daily, Disp: 90 tablet, Rfl: 0 .  metoprolol succinate (TOPROL-XL) 50 MG 24 hr tablet, Take 1 tablet by mouth once daily, Disp: 90 tablet, Rfl: 0 .  omeprazole (PRILOSEC) 40 MG capsule, Take 1 capsule by mouth once daily, Disp: 90 capsule, Rfl: 0  EXAM:  VITALS per patient if applicable:  GENERAL: alert, oriented, appears well and in no acute distress  HEENT: atraumatic, conjunctiva clear, no obvious abnormalities on inspection of external nose and ears  NECK: normal movements of the head and neck  LUNGS: on inspection no signs of respiratory distress, breathing rate appears normal, no obvious gross SOB, gasping or wheezing  CV: no obvious cyanosis  MS: moves all visible extremities without noticeable abnormality  PSYCH/NEURO: pleasant and cooperative, no obvious depression or anxiety, speech and thought processing grossly intact  ASSESSMENT AND PLAN:  Discussed the following assessment and plan:  Nonallopathic lesion of thoracic region - Plan: Ambulatory referral to Physical Therapy  Muscle spasm of back - Plan: cyclobenzaprine (FLEXERIL) 5 MG tablet, Ambulatory referral to Physical Therapy  Acute midline thoracic back pain  Acute midline thoracic back pain Referral to physical therapy for evaluation and treatment including TENS unit.  refilling flexeril     I discussed the assessment and treatment plan with the patient. The patient was provided an opportunity to ask questions and all were answered. The patient agreed with the plan and demonstrated an understanding of the instructions.   The patient was advised to call back or seek an in-person evaluation if the symptoms worsen or if the condition fails to improve as anticipated.   I provided 15 minutes of non-face-to-face time during this encounter reviewing patient's current problems and past imaging studies .  Providing counseling on the above mentioned problems , and coordination  of care . Crecencio Mc,  MD

## 2019-04-04 ENCOUNTER — Other Ambulatory Visit: Payer: Self-pay

## 2019-04-04 MED ORDER — AMLODIPINE BESYLATE 5 MG PO TABS: 5.0000 mg | ORAL_TABLET | Freq: Every day | ORAL | 1 refills | Status: DC

## 2019-04-07 ENCOUNTER — Other Ambulatory Visit: Payer: Self-pay

## 2019-04-07 MED ORDER — AMLODIPINE BESYLATE 5 MG PO TABS: 5.0000 mg | ORAL_TABLET | Freq: Every day | ORAL | 1 refills | Status: DC

## 2019-04-23 ENCOUNTER — Ambulatory Visit: Payer: BC Managed Care – PPO | Admitting: Internal Medicine

## 2019-05-19 ENCOUNTER — Ambulatory Visit: Payer: BC Managed Care – PPO | Admitting: Internal Medicine

## 2019-05-28 ENCOUNTER — Other Ambulatory Visit: Payer: Self-pay

## 2019-05-28 ENCOUNTER — Ambulatory Visit (INDEPENDENT_AMBULATORY_CARE_PROVIDER_SITE_OTHER): Payer: BC Managed Care – PPO | Admitting: Internal Medicine

## 2019-05-28 ENCOUNTER — Encounter: Payer: Self-pay | Admitting: Internal Medicine

## 2019-05-28 DIAGNOSIS — B009 Herpesviral infection, unspecified: Secondary | ICD-10-CM

## 2019-05-28 MED ORDER — ACYCLOVIR 400 MG PO TABS: 400.0000 mg | ORAL_TABLET | Freq: Every day | ORAL | 1 refills | Status: DC

## 2019-05-28 NOTE — Progress Notes (Signed)
Virtual Visit converted to Telephone Note  This visit type was conducted due to national recommendations for restrictions regarding the COVID-19 pandemic (e.g. social distancing).  This format is felt to be most appropriate for this patient at this time.  All issues noted in this document were discussed and addressed.  No physical exam was performed (except for noted visual exam findings with Video Visits).   I attempted to  connect with@ on 05/28/19 at 10:00 AM EST by a video enabled telemedicine application.    Interactive audio and video telecommunications were initially established beteen this provider and patient, however ultimately failed, due to patient having technical difficulties. We continued and completed visit with audio only  and verified that I am speaking with the correct person using two identifiers.  Location patient: home Location provider: work or home office Persons participating in the virtual visit: patient, provider  I discussed the limitations, risks, security and privacy concerns of performing an evaluation and management service by telephone and the availability of in person appointments. I also discussed with the patient that there may be a patient responsible charge related to this service. The patient expressed understanding and agreed to proceed.  Reason for visit: rash  HPI:  Herpes outbreak on face .  Occurred In late September.  invovled left cheek.  Vesicular red rash ,  Vesicles became purulent.  Did ont start acyclovir until oct 1.  Rash did not clear until about 2 weeks agoo  History of vesicular rash on left cheek in 2014,  With known hx f herpes.  wasgiven valcyclovir to take daily . Would like to resume a daily prophylactic dose.   ROS: See pertinent positives and negatives per HPI.  Past Medical History:  Diagnosis Date  . Allergy   . Anxiety   . Asthma   . Depression   . GERD (gastroesophageal reflux disease)   . Heart murmur   . Hyperlipidemia    . Hypertension   . Sleep apnea   . Tobacco abuse     Past Surgical History:  Procedure Laterality Date  . ABDOMINAL HYSTERECTOMY  2008   partial  . COLONOSCOPY WITH PROPOFOL N/A 06/04/2018   Procedure: COLONOSCOPY WITH PROPOFOL;  Surgeon: Jonathon Bellows, MD;  Location: [facility] ENDOSCOPY;  Service: Gastroenterology;  Laterality: N/A;  . right toe bunionectomy    . right wrist ganglion cyst removal      Family History  Problem Relation Age of Onset  . Kidney disease Mother 40       ESKD, partial neprhectomy  . COPD Mother   . Heart disease Father        CABG at age 102  . Alcohol abuse Father        until late 10  . Diabetes Father   . Hypertension Father   . Diabetes Sister   . Cancer Sister        uterine ca  . Cancer Maternal Uncle 60       stage 4 colon CA  . Colon cancer Maternal Uncle   . Esophageal cancer Neg Hx   . Pancreatic cancer Neg Hx   . Rectal cancer Neg Hx   . Stomach cancer Neg Hx   . Breast cancer Neg Hx     SOCIAL HX:  reports that she has been smoking cigarettes. She has a 10.50 pack-year smoking history. She has never used smokeless tobacco. She reports current alcohol use. She reports that she does not use drugs.   Current  Outpatient Medications:  .  acyclovir (ZOVIRAX) 400 MG tablet, Take 1 tablet (400 mg total) by mouth 5 (five) times daily., Disp: 35 tablet, Rfl: 2 .  amLODipine (NORVASC) 5 MG tablet, Take 1 tablet (5 mg total) by mouth daily., Disp: 30 tablet, Rfl: 1 .  celecoxib (CELEBREX) 200 MG capsule, Take 1 capsule by mouth once daily, Disp: 90 capsule, Rfl: 0 .  citalopram (CELEXA) 20 MG tablet, Take 1 tablet by mouth once daily, Disp: 90 tablet, Rfl: 0 .  cyclobenzaprine (FLEXERIL) 5 MG tablet, Take 1 tablet (5 mg total) by mouth 3 (three) times daily as needed for muscle spasms., Disp: 90 tablet, Rfl: 3 .  fluticasone (FLONASE) 50 MCG/ACT nasal spray, Place 2 sprays into both nostrils daily., Disp: 16 g, Rfl: 6 .  isosorbide mononitrate  (IMDUR) 30 MG 24 hr tablet, Take 1 tablet by mouth once daily, Disp: 90 tablet, Rfl: 0 .  metoprolol succinate (TOPROL-XL) 50 MG 24 hr tablet, Take 1 tablet by mouth once daily, Disp: 90 tablet, Rfl: 0 .  omeprazole (PRILOSEC) 40 MG capsule, Take 1 capsule by mouth once daily, Disp: 90 capsule, Rfl: 0 .  acyclovir (ZOVIRAX) 400 MG tablet, Take 1 tablet (400 mg total) by mouth daily. For prevention of outbreaks, Disp: 90 tablet, Rfl: 1  EXAM:   General impression: alert, cooperative and articulate.  No signs of being in distress  Lungs: speech is fluent sentence length suggests that patient is not short of breath and not punctuated by cough, sneezing or sniffing. [name]   Psych: affect normal.  speech is articulate and non pressured .  Denies suicidal thoughts   ASSESSMENT AND PLAN:  Discussed the following assessment and plan:  Herpes simplex infection  Herpes simplex infection Involving left cheek.  Recent episode was prolonged by presumed bacterial infection (probable MRSA).  Resume daily prophylaxis     I discussed the assessment and treatment plan with the patient. The patient was provided an opportunity to ask questions and all were answered. The patient agreed with the plan and demonstrated an understanding of the instructions.   The patient was advised to call back or seek an in-person evaluation if the symptoms worsen or if the condition fails to improve as anticipated.   I provided  15 minutes of non-face-to-face time during this encounter reviewing patient's current problems and past procedures/imaging studies, providing counseling on the above mentioned problems , and coordination  of care .  Crecencio Mc, MD

## 2019-05-28 NOTE — Assessment & Plan Note (Signed)
Involving left cheek.  Recent episode was prolonged by presumed bacterial infection (probable MRSA).  Resume daily prophylaxis

## 2019-06-30 ENCOUNTER — Other Ambulatory Visit: Payer: Self-pay

## 2019-06-30 ENCOUNTER — Ambulatory Visit (INDEPENDENT_AMBULATORY_CARE_PROVIDER_SITE_OTHER): Payer: BC Managed Care – PPO | Admitting: Internal Medicine

## 2019-06-30 ENCOUNTER — Encounter: Payer: Self-pay | Admitting: Internal Medicine

## 2019-06-30 DIAGNOSIS — Z716 Tobacco abuse counseling: Secondary | ICD-10-CM

## 2019-06-30 DIAGNOSIS — F411 Generalized anxiety disorder: Secondary | ICD-10-CM | POA: Diagnosis not present

## 2019-06-30 MED ORDER — CHANTIX STARTING MONTH PAK 0.5 MG X 11 & 1 MG X 42 PO TABS: ORAL_TABLET | ORAL | 0 refills | Status: DC

## 2019-06-30 NOTE — Assessment & Plan Note (Signed)
counselling given re Chantix pharmacotherapy

## 2019-06-30 NOTE — Progress Notes (Signed)
Virtual Visit via [email or web address]  This visit type was conducted due to national recommendations for restrictions regarding the COVID-19 pandemic (e.g. social distancing).  This format is felt to be most appropriate for this patient at this time.  All issues noted in this document were discussed and addressed.  No physical exam was performed (except for noted visual exam findings with Video Visits).   I connected with@ on 06/30/19 at  3:00 PM EST by a video enabled telemedicine application and verified that I am speaking with the correct person using two identifiers. Location patient: home Location provider: work or home office Persons participating in the virtual visit: patient, provider  I discussed the limitations, risks, security and privacy concerns of performing an evaluation and management service by telephone and the availability of in person appointments. I also discussed with the patient that there may be a patient responsible charge related to this service. The patient expressed understanding and agreed to proceed   Reason for visit: one month follow up  HPI:  54 yr old female with GAD/depressio and ongoing tobacco abuse presents for  Follow up.  Tobacco abuse:  She is requesting help in quitting smoking .  She has reduced her use to 1/3 pack daily and today has only ad 3 cigs.    Her motivation is strong ,  In part due to the fact that  She  has a new companion , a fellow widower who does not smoke or drink .  She has also reduced her alcohol use as week,  Largely a positive influence of her new boyfriend.     She has used chantix twice in the past.  One prior to her husband's death ,  But resumed smoking due to anxiety of being a widow.  The second time she did not tolerate the med due to bad dreams.  Prior trial of wellbutrin was ineffective.   Follow up on depression/anxeity: mood has improved.  Tolerating celexa with good results.      ROS: See pertinent positives and negatives  per HPI.  Past Medical History:  Diagnosis Date  . Allergy   . Anxiety   . Asthma   . Depression   . GERD (gastroesophageal reflux disease)   . Heart murmur   . Hyperlipidemia   . Hypertension   . Sleep apnea   . Tobacco abuse     Past Surgical History:  Procedure Laterality Date  . ABDOMINAL HYSTERECTOMY  2008   partial  . COLONOSCOPY WITH PROPOFOL N/A 06/04/2018   Procedure: COLONOSCOPY WITH PROPOFOL;  Surgeon: Jonathon Bellows, MD;  Location: [facility] ENDOSCOPY;  Service: Gastroenterology;  Laterality: N/A;  . right toe bunionectomy    . right wrist ganglion cyst removal      Family History  Problem Relation Age of Onset  . Kidney disease Mother 80       ESKD, partial neprhectomy  . COPD Mother   . Heart disease Father        CABG at age 41  . Alcohol abuse Father        until late 99  . Diabetes Father   . Hypertension Father   . Diabetes Sister   . Cancer Sister        uterine ca  . Cancer Maternal Uncle 60       stage 4 colon CA  . Colon cancer Maternal Uncle   . Esophageal cancer Neg Hx   . Pancreatic cancer Neg Hx   . Rectal  cancer Neg Hx   . Stomach cancer Neg Hx   . Breast cancer Neg Hx     SOCIAL HX:  reports that she has been smoking cigarettes. She has a 10.50 pack-year smoking history. She has never used smokeless tobacco. She reports current alcohol use. She reports that she does not use drugs.   Current Outpatient Medications:  .  acyclovir (ZOVIRAX) 400 MG tablet, Take 1 tablet (400 mg total) by mouth daily. For prevention of outbreaks, Disp: 90 tablet, Rfl: 1 .  amLODipine (NORVASC) 5 MG tablet, Take 1 tablet (5 mg total) by mouth daily., Disp: 30 tablet, Rfl: 1 .  celecoxib (CELEBREX) 200 MG capsule, Take 1 capsule by mouth once daily, Disp: 90 capsule, Rfl: 0 .  citalopram (CELEXA) 20 MG tablet, Take 1 tablet by mouth once daily, Disp: 90 tablet, Rfl: 0 .  cyclobenzaprine (FLEXERIL) 5 MG tablet, Take 1 tablet (5 mg total) by mouth 3 (three) times  daily as needed for muscle spasms., Disp: 90 tablet, Rfl: 3 .  fluticasone (FLONASE) 50 MCG/ACT nasal spray, Place 2 sprays into both nostrils daily., Disp: 16 g, Rfl: 6 .  isosorbide mononitrate (IMDUR) 30 MG 24 hr tablet, Take 1 tablet by mouth once daily, Disp: 90 tablet, Rfl: 0 .  metoprolol succinate (TOPROL-XL) 50 MG 24 hr tablet, Take 1 tablet by mouth once daily, Disp: 90 tablet, Rfl: 0 .  omeprazole (PRILOSEC) 40 MG capsule, Take 1 capsule by mouth once daily, Disp: 90 capsule, Rfl: 0 .  varenicline (CHANTIX STARTING MONTH PAK) 0.5 MG X 11 & 1 MG X 42 tablet, Take one 0.5 mg tablet by mouth once daily for 3 days, then increase to one 0.5 mg tablet twice daily for 4 days, then increase to one 1 mg tablet twice daily., Disp: 53 tablet, Rfl: 0  EXAM:  VITALS per patient if applicable:  GENERAL: alert, oriented, appears well and in no acute distress  HEENT: atraumatic, conjunttiva clear, no obvious abnormalities on inspection of external nose and ears  NECK: normal movements of the head and neck  LUNGS: on inspection no signs of respiratory distress, breathing rate appears normal, no obvious gross SOB, gasping or wheezing  CV: no obvious cyanosis  MS: moves all visible extremities without noticeable abnormality  PSYCH/NEURO: pleasant and cooperative, no obvious depression or anxiety, speech and thought processing grossly intact  ASSESSMENT AND PLAN:  Discussed the following assessment and plan:  Generalized anxiety disorder  Tobacco abuse counseling  Generalized anxiety disorder She has seen an  improvement with the  Increase in the celexa dose to 20 mg daily .    Tobacco abuse counseling counselling given re Chantix pharmacotherapy     I discussed the assessment and treatment plan with the patient. The patient was provided an opportunity to ask questions and all were answered. The patient agreed with the plan and demonstrated an understanding of the instructions.   The  patient was advised to call back or seek an in-person evaluation if the symptoms worsen or if the condition fails to improve as anticipated.  I provided  25 minutes of non-face-to-face time during this encounter reviewing patient's current problems and past procedures/imaging studies, providing counseling on the above mentioned problems , and coordination  of care .   Crecencio Mc, MD

## 2019-06-30 NOTE — Assessment & Plan Note (Addendum)
She has seen an  improvement with the  Increase in the celexa dose to 20 mg daily .

## 2019-07-14 ENCOUNTER — Other Ambulatory Visit: Payer: Self-pay | Admitting: Internal Medicine

## 2019-07-14 DIAGNOSIS — I10 Essential (primary) hypertension: Secondary | ICD-10-CM

## 2019-07-29 ENCOUNTER — Telehealth: Payer: Self-pay | Admitting: Internal Medicine

## 2019-07-29 DIAGNOSIS — K219 Gastro-esophageal reflux disease without esophagitis: Secondary | ICD-10-CM

## 2019-07-29 NOTE — Telephone Encounter (Signed)
Pt needs the varenicline and omeprazole called in to [facility]. She said she called pharmacy but they told her they didn't have anymore refills.

## 2019-07-31 ENCOUNTER — Other Ambulatory Visit: Payer: Self-pay

## 2019-07-31 MED ORDER — VARENICLINE TARTRATE 1 MG PO TABS: 1.0000 mg | ORAL_TABLET | Freq: Two times a day (BID) | ORAL | 3 refills | Status: DC

## 2019-07-31 MED ORDER — OMEPRAZOLE 40 MG PO CPDR: 40.0000 mg | DELAYED_RELEASE_CAPSULE | Freq: Every day | ORAL | 1 refills | Status: DC

## 2019-07-31 NOTE — Telephone Encounter (Signed)
Sent in chantix continuiing month

## 2019-07-31 NOTE — Telephone Encounter (Signed)
Omeprazole has been refilled. Pt is needing a refill on Chantix.   Last OV: 06/30/2019 Next OV: not scheduled

## 2019-08-06 ENCOUNTER — Ambulatory Visit: Payer: BC Managed Care – PPO | Admitting: Internal Medicine

## 2019-08-08 ENCOUNTER — Other Ambulatory Visit: Payer: Self-pay

## 2019-08-08 DIAGNOSIS — I10 Essential (primary) hypertension: Secondary | ICD-10-CM

## 2019-08-09 MED ORDER — CELECOXIB 200 MG PO CAPS: 200.0000 mg | ORAL_CAPSULE | Freq: Every day | ORAL | 0 refills | Status: DC

## 2019-08-09 MED ORDER — METOPROLOL SUCCINATE ER 50 MG PO TB24: 50.0000 mg | ORAL_TABLET | Freq: Every day | ORAL | 0 refills | Status: DC

## 2019-08-09 MED ORDER — ISOSORBIDE MONONITRATE ER 30 MG PO TB24: 30.0000 mg | ORAL_TABLET | Freq: Every day | ORAL | 0 refills | Status: DC

## 2019-08-09 MED ORDER — CITALOPRAM HYDROBROMIDE 20 MG PO TABS: 20.0000 mg | ORAL_TABLET | Freq: Every day | ORAL | 0 refills | Status: DC

## 2019-09-25 ENCOUNTER — Other Ambulatory Visit: Payer: Self-pay | Admitting: Internal Medicine

## 2019-09-29 ENCOUNTER — Telehealth: Payer: Self-pay | Admitting: Internal Medicine

## 2019-09-29 NOTE — Telephone Encounter (Signed)
LMTCB

## 2019-09-29 NOTE — Telephone Encounter (Signed)
Pt had her second covid vaccine on Saturday and is having a sore throat and  Fatigue Pt wanted to know what she could take for her sore throat

## 2019-09-29 NOTE — Telephone Encounter (Signed)
Please advise 

## 2019-09-29 NOTE — Telephone Encounter (Signed)
Pt called back returning your call  Please call [phone]

## 2019-10-27 ENCOUNTER — Other Ambulatory Visit: Payer: Self-pay | Admitting: Internal Medicine

## 2019-12-17 ENCOUNTER — Telehealth: Payer: Self-pay | Admitting: Internal Medicine

## 2019-12-17 NOTE — Telephone Encounter (Signed)
Pt needs a refill on varenicline (CHANTIX CONTINUING MONTH PAK) 1 MG tablet sent to [facility]

## 2019-12-18 ENCOUNTER — Other Ambulatory Visit: Payer: Self-pay

## 2019-12-18 ENCOUNTER — Emergency Department
Admission: EM | Admit: 2019-12-18 | Discharge: 2019-12-18 | Disposition: A | Discharge status: Home / Self Care | Payer: BC Managed Care – PPO | Attending: Emergency Medicine | Admitting: Emergency Medicine

## 2019-12-18 ENCOUNTER — Emergency Department: Payer: BC Managed Care – PPO

## 2019-12-18 DIAGNOSIS — Z87891 Personal history of nicotine dependence: Secondary | ICD-10-CM | POA: Insufficient documentation

## 2019-12-18 DIAGNOSIS — I10 Essential (primary) hypertension: Secondary | ICD-10-CM | POA: Insufficient documentation

## 2019-12-18 DIAGNOSIS — J45909 Unspecified asthma, uncomplicated: Secondary | ICD-10-CM | POA: Diagnosis not present

## 2019-12-18 DIAGNOSIS — J441 Chronic obstructive pulmonary disease with (acute) exacerbation: Secondary | ICD-10-CM | POA: Insufficient documentation

## 2019-12-18 DIAGNOSIS — Z79899 Other long term (current) drug therapy: Secondary | ICD-10-CM | POA: Diagnosis not present

## 2019-12-18 DIAGNOSIS — Z7951 Long term (current) use of inhaled steroids: Secondary | ICD-10-CM | POA: Diagnosis not present

## 2019-12-18 DIAGNOSIS — R0781 Pleurodynia: Secondary | ICD-10-CM | POA: Diagnosis present

## 2019-12-18 MED ORDER — VARENICLINE TARTRATE 1 MG PO TABS: 1.0000 mg | ORAL_TABLET | Freq: Two times a day (BID) | ORAL | 3 refills | Status: DC

## 2019-12-18 MED ORDER — HYDROCODONE-ACETAMINOPHEN 5-325 MG PO TABS: 1.0000 | ORAL_TABLET | Freq: Four times a day (QID) | ORAL | 0 refills | Status: DC | PRN

## 2019-12-18 NOTE — ED Triage Notes (Signed)
Pt states she leaned over a consoled and felt something pop in the left chest and having pain since , worse with movement

## 2019-12-18 NOTE — Addendum Note (Signed)
Addended by: Crecencio Mc on: 12/18/2019 08:24 AM   Modules accepted: Orders

## 2019-12-18 NOTE — Discharge Instructions (Signed)
Follow-up with your primary care provider if any continued problems or concerns.  Continue taking your Celebrex as prescribed by your doctor.  The Norco is to be taken mainly at night to be able to sleep however you can take it every 6 hours if needed for moderate to severe pain.  Use ice to your ribs as needed for discomfort.

## 2019-12-18 NOTE — ED Provider Notes (Signed)
[facility] Emergency Department Provider Note  ____________________________________________   None    (approximate)  I have reviewed the triage vital signs and the nursing notes.   HISTORY  Chief Complaint Rib Pain   HPI Melanie Fisher is a 54 y.o. female presents today with complaint of left rib pain.  Patient states that she was leaning over the console of a car yesterday when she felt something pop.  She states it is worse with movement.  Patient did not take any over-the-counter medication for her pain last evening.  She rates her pain as 7 out of 10.      Past Medical History:  Diagnosis Date  . Allergy   . Anxiety   . Asthma   . Depression   . GERD (gastroesophageal reflux disease)   . Heart murmur   . Hyperlipidemia   . Hypertension   . Sleep apnea   . Tobacco abuse     Patient Active Problem List   Diagnosis Date Noted  . Herpes simplex infection 05/28/2019  . Acute midline thoracic back pain 04/01/2019  . Candida vaginitis 05/22/2018  . Widowed 05/21/2018  . Tubular adenoma of colon 05/21/2018  . B12 deficiency 05/21/2018  . COPD exacerbation ([place]) 12/15/2016  . Insomnia due to anxiety and fear 04/10/2015  . Polyneuropathy 02/10/2015  . Cervical disc disorder with radiculopathy of cervical region 12/25/2014  . Elevated blood pressure reading in office with white coat syndrome, with diagnosis of hypertension 08/19/2014  . Slipped rib syndrome 03/23/2014  . Degenerative disc disease, thoracic 03/23/2014  . Nonallopathic lesion of thoracic region 03/23/2014  . Nonallopathic lesion-rib cage 03/23/2014  . Degenerative disk disease 02/12/2014  . Personal history of colonic polyps 02/11/2014  . Other chest pain 11/18/2013  . Obesity 07/01/2013  . Tobacco abuse counseling 03/15/2013  . Rash of unknown cause 02/18/2013  . Routine general medical examination at a health care facility 09/04/2012  . COPD (chronic obstructive  pulmonary disease) ([place]) 05/26/2012  . Chronic shoulder pain 04/09/2012  . Generalized anxiety disorder 04/06/2012  . Tobacco abuse 04/06/2012  . Depression with anxiety   . Heart murmur   . Hyperlipidemia   . Allergy     Past Surgical History:  Procedure Laterality Date  . ABDOMINAL HYSTERECTOMY  2008   partial  . COLONOSCOPY WITH PROPOFOL N/A 06/04/2018   Procedure: COLONOSCOPY WITH PROPOFOL;  Surgeon: Jonathon Bellows, MD;  Location: [facility] ENDOSCOPY;  Service: Gastroenterology;  Laterality: N/A;  . right toe bunionectomy    . right wrist ganglion cyst removal      Prior to Admission medications   Medication Sig Start Date End Date Taking? Authorizing Provider  acyclovir (ZOVIRAX) 400 MG tablet Take 1 tablet (400 mg total) by mouth daily. For prevention of outbreaks 05/28/19   Crecencio Mc, MD  amLODipine (NORVASC) 5 MG tablet Take 1 tablet (5 mg total) by mouth daily. 04/07/19   Crecencio Mc, MD  celecoxib (CELEBREX) 200 MG capsule Take 1 capsule by mouth once daily 10/28/19   Crecencio Mc, MD  citalopram (CELEXA) 20 MG tablet Take 1 tablet by mouth once daily 09/25/19   Crecencio Mc, MD  cyclobenzaprine (FLEXERIL) 5 MG tablet Take 1 tablet (5 mg total) by mouth 3 (three) times daily as needed for muscle spasms. 04/01/19   Crecencio Mc, MD  fluticasone (FLONASE) 50 MCG/ACT nasal spray Place 2 sprays into both nostrils daily. 10/15/18   Jodelle Green, FNP  HYDROcodone-acetaminophen (NORCO/VICODIN) 5-325 MG tablet Take 1 tablet by mouth every 6 (six) hours as needed for moderate pain. 12/18/19   Johnn Hai, PA-C  isosorbide mononitrate (IMDUR) 30 MG 24 hr tablet Take 1 tablet (30 mg total) by mouth daily. 08/09/19   Crecencio Mc, MD  metoprolol succinate (TOPROL-XL) 50 MG 24 hr tablet Take 1 tablet (50 mg total) by mouth daily. Take with or immediately following a meal. 08/09/19   Crecencio Mc, MD  omeprazole (PRILOSEC) 40 MG capsule Take 1 capsule (40 mg total) by mouth  daily. 07/31/19   Crecencio Mc, MD  varenicline (CHANTIX CONTINUING MONTH PAK) 1 MG tablet Take 1 tablet (1 mg total) by mouth 2 (two) times daily. 12/18/19   Crecencio Mc, MD    Allergies Patient has no known allergies.  Family History  Problem Relation Age of Onset  . Kidney disease Mother 20       ESKD, partial neprhectomy  . COPD Mother   . Heart disease Father        CABG at age 30  . Alcohol abuse Father        until late 13  . Diabetes Father   . Hypertension Father   . Diabetes Sister   . Cancer Sister        uterine ca  . Cancer Maternal Uncle 60       stage 4 colon CA  . Colon cancer Maternal Uncle   . Esophageal cancer Neg Hx   . Pancreatic cancer Neg Hx   . Rectal cancer Neg Hx   . Stomach cancer Neg Hx   . Breast cancer Neg Hx     Social History Social History   Tobacco Use  . Smoking status: Former Smoker    Packs/day: 0.50    Years: 21.00    Pack years: 10.50    Types: Cigarettes  . Smokeless tobacco: Never Used  Vaping Use  . Vaping Use: Never used  Substance Use Topics  . Alcohol use: Not Currently    Comment: occasionally  . Drug use: No    Review of Systems Constitutional: No fever/chills Eyes: No visual changes. ENT: No complaints. Cardiovascular: Denies chest pain. Respiratory: Denies shortness of breath.  Positive left rib pain. Gastrointestinal: No abdominal pain.  No nausea, no vomiting. Musculoskeletal: Positive for rib pain as already stated. Skin: Negative for rash. Neurological: Negative for headaches, focal weakness or numbness.  ____________________________________________   PHYSICAL EXAM:  VITAL SIGNS: ED Triage Vitals [12/18/19 0708]  Enc Vitals Group     BP (!) 159/96     Pulse Rate 61     Resp 16     Temp 97.8 F (36.6 C)     Temp Source Oral     SpO2 100 %     Weight 169 lb (76.7 kg)     Height 5\' 3"  (1.6 m)     Head Circumference      Peak Flow      Pain Score 7     Pain Loc      Pain Edu?       Excl. in [place]?     Constitutional: Alert and oriented. Well appearing and in no acute distress. Eyes: Conjunctivae are normal. PERRL. EOMI. Head: Atraumatic. Neck: No stridor.   Cardiovascular: Normal rate, regular rhythm. Grossly normal heart sounds.  Good peripheral circulation. Respiratory: Normal respiratory effort.  No retractions. Lungs clear bilaterally.  There is moderate tenderness on  palpation of the left lateral ribs.  No soft tissue edema or discoloration is noted in the area. Gastrointestinal: Soft and nontender. No distention.  Musculoskeletal: Nontender thoracic spine is able to ambulate without assistance. Neurologic:  Normal speech and language. No gross focal neurologic deficits are appreciated. No gait instability. Skin:  Skin is warm, dry and intact.  Psychiatric: Mood and affect are normal. Speech and behavior are normal.  ____________________________________________   LABS (all labs ordered are listed, but only abnormal results are displayed)  Labs Reviewed - No data to display ____________________________________________  RADIOLOGY   Official radiology report(s): DG Ribs Unilateral W/Chest Left  Result Date: 12/18/2019 CLINICAL DATA:  Left-sided chest pain. EXAM: LEFT RIBS AND CHEST - 3+ VIEW COMPARISON:  Chest x-ray 05/02/2017 FINDINGS: The cardiac silhouette, mediastinal and hilar contours are normal. The lungs are clear. No pleural effusion or pneumothorax. Dedicated views of the left ribs do not demonstrate any definite acute left-sided rib fractures. No pleural thickening. IMPRESSION: No acute cardiopulmonary findings. No definite acute left-sided rib fractures. Electronically Signed   By: Marijo Sanes M.D.   On: 12/18/2019 07:59    ____________________________________________   PROCEDURES  Procedure(s) performed (including Critical Care):  Procedures   ____________________________________________   INITIAL IMPRESSION / ASSESSMENT AND PLAN / ED  COURSE  As part of my medical decision making, I reviewed the following data within the electronic MEDICAL RECORD NUMBER Notes from prior ED visits and [place] Controlled Substance Database  54 year old female presents to the ED with complaint of left-sided rib pain that began suddenly.  Patient states that she was leaning over a console inside her car when she felt a "pop".  She has continued to have pain since that time.  Patient states that taking deep breaths or movement increases her pain.  She denies any nausea, vomiting or shortness of breath.  X-rays were negative for fracture.  Patient was given a prescription for hydrocodone/acetaminophen as needed for pain.  She is encouraged to use ice to her ribs as needed for discomfort.  She is to follow-up with her PCP if any continued problems.  ____________________________________________   FINAL CLINICAL IMPRESSION(S) / ED DIAGNOSES  Final diagnoses:  Rib pain on left side     ED Discharge Orders         Ordered    HYDROcodone-acetaminophen (NORCO/VICODIN) 5-325 MG tablet  Every 6 hours PRN     Discontinue  Reprint     12/18/19 0815           Note:  This document was prepared using Dragon voice recognition software and may include unintentional dictation errors.    Johnn Hai, PA-C 12/18/19 1228    Vanessa Paxtang, MD 12/19/19 [phone]

## 2019-12-18 NOTE — ED Notes (Signed)
See triage note  Presents with left rib pain  States she did not fall but leaned over console and felt pain

## 2019-12-24 ENCOUNTER — Encounter: Payer: Self-pay | Admitting: Nurse Practitioner

## 2019-12-24 ENCOUNTER — Ambulatory Visit (INDEPENDENT_AMBULATORY_CARE_PROVIDER_SITE_OTHER): Payer: BC Managed Care – PPO | Admitting: Nurse Practitioner

## 2019-12-24 ENCOUNTER — Other Ambulatory Visit: Payer: Self-pay

## 2019-12-24 VITALS — BP 104/68 | HR 75 | Temp 97.6°F | Resp 18 | Ht 64.0 in | Wt 170.8 lb

## 2019-12-24 DIAGNOSIS — R0781 Pleurodynia: Secondary | ICD-10-CM | POA: Diagnosis not present

## 2019-12-24 NOTE — Patient Instructions (Addendum)
It was nice to see you today.   Continue with Tylenol as needed. You may return to work  without restrictions.    Chest Wall Pain Chest wall pain is pain in or around the bones and muscles of your chest. Sometimes, an injury causes this pain. Excessive coughing or overuse of arm and chest muscles may also cause chest wall pain. Sometimes, the cause may not be known. This pain may take several weeks or longer to get better. Follow these instructions at home: Managing pain, stiffness, and swelling   If directed, put ice on the painful area: ? Put ice in a plastic bag. ? Place a towel between your skin and the bag. ? Leave the ice on for 20 minutes, 2-3 times per day. Activity  Rest as told by your health care provider.  Avoid activities that cause pain. These include any activities that use your chest muscles or your abdominal and side muscles to lift heavy items. Ask your health care provider what activities are safe for you. General instructions   Take over-the-counter and prescription medicines only as told by your health care provider.  Do not use any products that contain nicotine or tobacco, such as cigarettes, e-cigarettes, and chewing tobacco. These can delay healing after injury. If you need help quitting, ask your health care provider.  Keep all follow-up visits as told by your health care provider. This is important. Contact a health care provider if:  You have a fever.  Your chest pain becomes worse.  You have new symptoms. Get help right away if:  You have nausea or vomiting.  You feel sweaty or light-headed.  You have a cough with mucus from your lungs (sputum) or you cough up blood.  You develop shortness of breath. These symptoms may represent a serious problem that is an emergency. Do not wait to see if the symptoms will go away. Get medical help right away. Call your local emergency services (911 in the [place] Do not drive yourself to the  hospital. Summary  Chest wall pain is pain in or around the bones and muscles of your chest.  Depending on the cause, it may be treated with ice, rest, medicines, and avoiding activities that cause pain.  Contact a health care provider if you have a fever, worsening chest pain, or new symptoms.  Get help right away if you feel light-headed or you develop shortness of breath. These symptoms may be an emergency. This information is not intended to replace advice given to you by your health care provider. Make sure you discuss any questions you have with your health care provider. Document Revised: 12/06/2017 Document Reviewed: 12/06/2017 Elsevier Patient Education  2020 [employer].

## 2019-12-24 NOTE — Progress Notes (Signed)
Established Patient Office Visit  Subjective:  Patient ID: Melanie Fisher, female    DOB: Oct 18, 1965  Age: 54 y.o. MRN: [id number]  CC:  Chief Complaint  Patient presents with  . Follow-up    ER Rib pain on left     HPI Melanie Fisher presents for a follow-up ED visit from 12/18/2019 for left rib  pain.  She was leaning over the console of the car and felt something pop.  The pain was worse with movement and described as 7 out of 10.  3 view chest x-ray revealed no acute cardiopulmonary findings.  There was no left-sided rib fracture.  She was given Norco and she is taking that at bedtime.  She takes Tylenol during the day.  She has no chest pain, shortness of breath, DOE, but she has felt a small knot in that area.  Past Medical History:  Diagnosis Date  . Allergy   . Anxiety   . Asthma   . Depression   . GERD (gastroesophageal reflux disease)   . Heart murmur   . Hyperlipidemia   . Hypertension   . Sleep apnea   . Tobacco abuse     Past Surgical History:  Procedure Laterality Date  . ABDOMINAL HYSTERECTOMY  2008   partial  . COLONOSCOPY WITH PROPOFOL N/A 06/04/2018   Procedure: COLONOSCOPY WITH PROPOFOL;  Surgeon: Jonathon Bellows, MD;  Location: [facility] ENDOSCOPY;  Service: Gastroenterology;  Laterality: N/A;  . right toe bunionectomy    . right wrist ganglion cyst removal      Family History  Problem Relation Age of Onset  . Kidney disease Mother 88       ESKD, partial neprhectomy  . COPD Mother   . Heart disease Father        CABG at age 32  . Alcohol abuse Father        until late 60  . Diabetes Father   . Hypertension Father   . Diabetes Sister   . Cancer Sister        uterine ca  . Cancer Maternal Uncle 60       stage 4 colon CA  . Colon cancer Maternal Uncle   . Esophageal cancer Neg Hx   . Pancreatic cancer Neg Hx   . Rectal cancer Neg Hx   . Stomach cancer Neg Hx   . Breast cancer Neg Hx     Social History   Socioeconomic History  .  Marital status: Single    Spouse name: Not on file  . Number of children: Not on file  . Years of education: Not on file  . Highest education level: Not on file  Occupational History  . Not on file  Tobacco Use  . Smoking status: Former Smoker    Packs/day: 0.50    Years: 21.00    Pack years: 10.50    Types: Cigarettes  . Smokeless tobacco: Never Used  Vaping Use  . Vaping Use: Never used  Substance and Sexual Activity  . Alcohol use: Not Currently    Comment: occasionally  . Drug use: No  . Sexual activity: Not Currently  Other Topics Concern  . Not on file  Social History Narrative  . Not on file   Social Determinants of Health   Financial Resource Strain:   . Difficulty of Paying Living Expenses:   Food Insecurity:   . Worried About Charity fundraiser in the Last Year:   . [employer]  of Food in the Last Year:   Transportation Needs:   . Film/video editor (Medical):   [name] Lack of Transportation (Non-Medical):   Physical Activity:   . Days of Exercise per Week:   . Minutes of Exercise per Session:   Stress:   . Feeling of Stress :   Social Connections:   . Frequency of Communication with Friends and Family:   . Frequency of Social Gatherings with Friends and Family:   . Attends Religious Services:   . Active Member of Clubs or Organizations:   . Attends Archivist Meetings:   [name] Marital Status:   Intimate Partner Violence:   . Fear of Current or Ex-Partner:   . Emotionally Abused:   [name] Physically Abused:   . Sexually Abused:     Outpatient Medications Prior to Visit  Medication Sig Dispense Refill  . acyclovir (ZOVIRAX) 400 MG tablet Take 1 tablet (400 mg total) by mouth daily. For prevention of outbreaks 90 tablet 1  . amLODipine (NORVASC) 5 MG tablet Take 1 tablet (5 mg total) by mouth daily. 30 tablet 1  . celecoxib (CELEBREX) 200 MG capsule Take 1 capsule by mouth once daily 90 capsule 0  . citalopram (CELEXA) 20 MG tablet Take 1 tablet by  mouth once daily 90 tablet 0  . cyclobenzaprine (FLEXERIL) 5 MG tablet Take 1 tablet (5 mg total) by mouth 3 (three) times daily as needed for muscle spasms. 90 tablet 3  . HYDROcodone-acetaminophen (NORCO/VICODIN) 5-325 MG tablet Take 1 tablet by mouth every 6 (six) hours as needed for moderate pain. 10 tablet 0  . isosorbide mononitrate (IMDUR) 30 MG 24 hr tablet Take 1 tablet (30 mg total) by mouth daily. 90 tablet 0  . metoprolol succinate (TOPROL-XL) 50 MG 24 hr tablet Take 1 tablet (50 mg total) by mouth daily. Take with or immediately following a meal. 90 tablet 0  . omeprazole (PRILOSEC) 40 MG capsule Take 1 capsule (40 mg total) by mouth daily. 90 capsule 1  . varenicline (CHANTIX CONTINUING MONTH PAK) 1 MG tablet Take 1 tablet (1 mg total) by mouth 2 (two) times daily. 60 tablet 3  . fluticasone (FLONASE) 50 MCG/ACT nasal spray Place 2 sprays into both nostrils daily. (Patient not taking: Reported on 12/24/2019) 16 g 6   No facility-administered medications prior to visit.    No Known Allergies   Review of Systems Pertinent positives noted in history of present illness otherwise negative.   Objective:    Physical Exam Vitals reviewed.  Constitutional:      Appearance: Normal appearance.  Eyes:     Pupils: Pupils are equal, round, and reactive to light.  Cardiovascular:     Rate and Rhythm: Normal rate and regular rhythm.     Pulses: Normal pulses.     Heart sounds: Normal heart sounds.  Pulmonary:     Effort: Pulmonary effort is normal.     Breath sounds: Normal breath sounds.  Abdominal:     Palpations: Abdomen is soft.     Tenderness: There is no abdominal tenderness.  Musculoskeletal:        General: Normal range of motion.     Cervical back: Normal range of motion and neck supple.     Comments: Left rib tenderness. No discreet knot palpated.   Skin:    General: Skin is warm and dry.  Neurological:     General: No focal deficit present.     Mental Status: She is  alert and oriented to person, place, and time.  Psychiatric:        Mood and Affect: Mood normal.        Behavior: Behavior normal.     BP 104/68 (BP Location: Left Arm, Patient Position: Sitting, Cuff Size: Normal)   Pulse 75   Temp 97.6 F (36.4 C) (Oral)   Resp 18   Ht 5\' 4"  (1.626 m)   Wt 170 lb 12 oz (77.5 kg)   SpO2 98%   BMI 29.31 kg/m  Wt Readings from Last 3 Encounters:  12/24/19 170 lb 12 oz (77.5 kg)  12/18/19 169 lb (76.7 kg)  06/30/19 195 lb (88.5 kg)     Health Maintenance Due  Topic Date Due  . Hepatitis C Screening  Never done  . HIV Screening  Never done    There are no preventive care reminders to display for this patient.  Lab Results  Component Value Date   TSH 4.11 02/21/2019   Lab Results  Component Value Date   WBC 4.8 04/01/2018   HGB 14.3 04/01/2018   HCT 41.6 04/01/2018   MCV 96.5 04/01/2018   PLT 196 04/01/2018   Lab Results  Component Value Date   NA 135 02/21/2019   K 3.8 02/21/2019   CO2 18 (L) 02/21/2019   GLUCOSE 102 (H) 02/21/2019   BUN 28 (H) 02/21/2019   CREATININE 0.94 02/21/2019   BILITOT 0.4 02/21/2019   ALKPHOS 57 04/01/2018   AST 16 02/21/2019   ALT 17 02/21/2019   PROT 7.0 02/21/2019   ALBUMIN 4.1 04/01/2018   CALCIUM 9.5 02/21/2019   ANIONGAP 9 04/01/2018   GFR 73.19 03/27/2018   Lab Results  Component Value Date   CHOL 207 (H) 02/21/2019   Lab Results  Component Value Date   HDL 62 02/21/2019   Lab Results  Component Value Date   LDLCALC 128 (H) 02/21/2019   Lab Results  Component Value Date   TRIG 74 02/21/2019   Lab Results  Component Value Date   CHOLHDL 3.3 02/21/2019   Lab Results  Component Value Date   HGBA1C 5.2 02/21/2019      Assessment & Plan:   Problem List Items Addressed This Visit      Other   Rib pain on left side - Primary     Mild chest wall contusion.  Emergency room records reviewed to include x-ray report.  Exam today is unremarkable.  She is  improved. Continue with Tylenol as needed. You may return to work  without restrictions.   No orders of the defined types were placed in this encounter.   Follow-up: Return if symptoms worsen or fail to improve.   This visit occurred during the SARS-CoV-2 public health emergency.  Safety protocols were in place, including screening questions prior to the visit, additional usage of staff PPE, and extensive cleaning of exam room while observing appropriate contact time as indicated for disinfecting solutions.    Denice Paradise, NP

## 2019-12-26 ENCOUNTER — Encounter: Payer: Self-pay | Admitting: Nurse Practitioner

## 2020-01-15 ENCOUNTER — Other Ambulatory Visit: Payer: Self-pay | Admitting: Internal Medicine

## 2020-01-19 ENCOUNTER — Other Ambulatory Visit: Payer: Self-pay | Admitting: Internal Medicine

## 2020-01-25 ENCOUNTER — Other Ambulatory Visit: Payer: Self-pay | Admitting: Internal Medicine

## 2020-01-25 DIAGNOSIS — I10 Essential (primary) hypertension: Secondary | ICD-10-CM

## 2020-02-06 ENCOUNTER — Encounter: Payer: Self-pay | Admitting: Internal Medicine

## 2020-02-06 ENCOUNTER — Other Ambulatory Visit: Payer: Self-pay

## 2020-02-06 ENCOUNTER — Ambulatory Visit (INDEPENDENT_AMBULATORY_CARE_PROVIDER_SITE_OTHER): Payer: BC Managed Care – PPO | Admitting: Internal Medicine

## 2020-02-06 VITALS — BP 122/78 | HR 70 | Temp 98.0°F | Resp 14 | Ht 64.0 in | Wt 177.8 lb

## 2020-02-06 DIAGNOSIS — Z1231 Encounter for screening mammogram for malignant neoplasm of breast: Secondary | ICD-10-CM | POA: Diagnosis not present

## 2020-02-06 DIAGNOSIS — E7800 Pure hypercholesterolemia, unspecified: Secondary | ICD-10-CM | POA: Diagnosis not present

## 2020-02-06 DIAGNOSIS — K219 Gastro-esophageal reflux disease without esophagitis: Secondary | ICD-10-CM

## 2020-02-06 DIAGNOSIS — Z0000 Encounter for general adult medical examination without abnormal findings: Secondary | ICD-10-CM | POA: Diagnosis not present

## 2020-02-06 DIAGNOSIS — I10 Essential (primary) hypertension: Secondary | ICD-10-CM

## 2020-02-06 DIAGNOSIS — Z87891 Personal history of nicotine dependence: Secondary | ICD-10-CM

## 2020-02-06 MED ORDER — CITALOPRAM HYDROBROMIDE 20 MG PO TABS
20.0000 mg | ORAL_TABLET | Freq: Every day | ORAL | 0 refills | Status: DC
Start: 2020-02-06 — End: 2020-07-28

## 2020-02-06 MED ORDER — AMLODIPINE BESYLATE 5 MG PO TABS: 5.0000 mg | ORAL_TABLET | Freq: Every day | ORAL | 1 refills | Status: DC

## 2020-02-06 MED ORDER — VARENICLINE TARTRATE 1 MG PO TABS
1.0000 mg | ORAL_TABLET | Freq: Two times a day (BID) | ORAL | 3 refills | Status: DC
Start: 2020-02-06 — End: 2020-06-30

## 2020-02-06 MED ORDER — OMEPRAZOLE 40 MG PO CPDR: 40.0000 mg | DELAYED_RELEASE_CAPSULE | Freq: Every day | ORAL | 1 refills | Status: DC

## 2020-02-06 MED ORDER — CELECOXIB 200 MG PO CAPS: 200.0000 mg | ORAL_CAPSULE | Freq: Every day | ORAL | 1 refills | Status: DC

## 2020-02-06 NOTE — Patient Instructions (Signed)
Your mammogram has been ordered.  You can call to make your appointment at [facility]   I have refilled your medications.  See you I n  6 months   Health Maintenance for Postmenopausal Women Menopause is a normal process in which your ability to get pregnant comes to an end. This process happens slowly over many months or years, usually between the ages of 49 and 76. Menopause is complete when you have missed your menstrual periods for 12 months. It is important to talk with your health care provider about some of the most common conditions that affect women after menopause (postmenopausal women). These include heart disease, cancer, and bone loss (osteoporosis). Adopting a healthy lifestyle and getting preventive care can help to promote your health and wellness. The actions you take can also lower your chances of developing some of these common conditions. What should I know about menopause? During menopause, you may get a number of symptoms, such as:  Hot flashes. These can be moderate or severe.  Night sweats.  Decrease in sex drive.  Mood swings.  Headaches.  Tiredness.  Irritability.  Memory problems.  Insomnia. Choosing to treat or not to treat these symptoms is a decision that you make with your health care provider. Do I need hormone replacement therapy?  Hormone replacement therapy is effective in treating symptoms that are caused by menopause, such as hot flashes and night sweats.  Hormone replacement carries certain risks, especially as you become older. If you are thinking about using estrogen or estrogen with progestin, discuss the benefits and risks with your health care provider. What is my risk for heart disease and stroke? The risk of heart disease, heart attack, and stroke increases as you age. One of the causes may be a change in the body's hormones during menopause. This can affect how your body uses dietary fats, triglycerides, and cholesterol.  Heart attack and stroke are medical emergencies. There are many things that you can do to help prevent heart disease and stroke. Watch your blood pressure  High blood pressure causes heart disease and increases the risk of stroke. This is more likely to develop in people who have high blood pressure readings, are of African descent, or are overweight.  Have your blood pressure checked: ? Every 3-5 years if you are 47-30 years of age. ? Every year if you are 61 years old or older. Eat a healthy diet   Eat a diet that includes plenty of vegetables, fruits, low-fat dairy products, and lean protein.  Do not eat a lot of foods that are high in solid fats, added sugars, or sodium. Get regular exercise Get regular exercise. This is one of the most important things you can do for your health. Most adults should:  Try to exercise for at least 150 minutes each week. The exercise should increase your heart rate and make you sweat (moderate-intensity exercise).  Try to do strengthening exercises at least twice each week. Do these in addition to the moderate-intensity exercise.  Spend less time sitting. Even light physical activity can be beneficial. Other tips  Work with your health care provider to achieve or maintain a healthy weight.  Do not use any products that contain nicotine or tobacco, such as cigarettes, e-cigarettes, and chewing tobacco. If you need help quitting, ask your health care provider.  Know your numbers. Ask your health care provider to check your cholesterol and your blood sugar (glucose). Continue to have your blood tested  as directed by your health care provider. Do I need screening for cancer? Depending on your health history and family history, you may need to have cancer screening at different stages of your life. This may include screening for:  Breast cancer.  Cervical cancer.  Lung cancer.  Colorectal cancer. What is my risk for osteoporosis? After menopause,  you may be at increased risk for osteoporosis. Osteoporosis is a condition in which bone destruction happens more quickly than new bone creation. To help prevent osteoporosis or the bone fractures that can happen because of osteoporosis, you may take the following actions:  If you are 70-50 years old, get at least 1,000 mg of calcium and at least 600 mg of vitamin D per day.  If you are older than age 53 but younger than age 69, get at least 1,200 mg of calcium and at least 600 mg of vitamin D per day.  If you are older than age 1, get at least 1,200 mg of calcium and at least 800 mg of vitamin D per day. Smoking and drinking excessive alcohol increase the risk of osteoporosis. Eat foods that are rich in calcium and vitamin D, and do weight-bearing exercises several times each week as directed by your health care provider. How does menopause affect my mental health? Depression may occur at any age, but it is more common as you become older. Common symptoms of depression include:  Low or sad mood.  Changes in sleep patterns.  Changes in appetite or eating patterns.  Feeling an overall lack of motivation or enjoyment of activities that you previously enjoyed.  Frequent crying spells. Talk with your health care provider if you think that you are experiencing depression. General instructions See your health care provider for regular wellness exams and vaccines. This may include:  Scheduling regular health, dental, and eye exams.  Getting and maintaining your vaccines. These include: ? Influenza vaccine. Get this vaccine each year before the flu season begins. ? Pneumonia vaccine. ? Shingles vaccine. ? Tetanus, diphtheria, and pertussis (Tdap) booster vaccine. Your health care provider may also recommend other immunizations. Tell your health care provider if you have ever been abused or do not feel safe at home. Summary  Menopause is a normal process in which your ability to get  pregnant comes to an end.  This condition causes hot flashes, night sweats, decreased interest in sex, mood swings, headaches, or lack of sleep.  Treatment for this condition may include hormone replacement therapy.  Take actions to keep yourself healthy, including exercising regularly, eating a healthy diet, watching your weight, and checking your blood pressure and blood sugar levels.  Get screened for cancer and depression. Make sure that you are up to date with all your vaccines. This information is not intended to replace advice given to you by your health care provider. Make sure you discuss any questions you have with your health care provider. Document Revised: 05/29/2018 Document Reviewed: 05/29/2018 Elsevier Patient Education  2020 [employer].

## 2020-02-08 ENCOUNTER — Encounter: Payer: Self-pay | Admitting: Internal Medicine

## 2020-02-08 NOTE — Assessment & Plan Note (Signed)

## 2020-02-08 NOTE — Assessment & Plan Note (Signed)
Well controlled on current regimen. Renal function is due, no changes today. °

## 2020-02-08 NOTE — Progress Notes (Signed)
Patient ID: Melanie Fisher, female    DOB: 06-10-1966  Age: 54 y.o. MRN: [id number]  The patient is here for annual preventive  examination and management of other chronic and acute problems.  This visit occurred during the SARS-CoV-2 public health emergency.  Safety protocols were in place, including screening questions prior to the visit, additional usage of staff PPE, and extensive cleaning of exam room while observing appropriate contact time as indicated for disinfecting solutions.      The risk factors are reflected in the social history.  The roster of all physicians providing medical care to patient - is listed in the Snapshot section of the chart.  Activities of daily living:  The patient is 100% independent in all ADLs: dressing, toileting, feeding as well as independent mobility  Home safety : The patient has smoke detectors in the home. They wear seatbelts.  There are no firearms at home. There is no violence in the home.   There is no risks for hepatitis, STDs or HIV. There is no   history of blood transfusion. They have no travel history to infectious disease endemic areas of the world.  The patient has seen their dentist in the last six month. They have seen their eye doctor in the last year. She denies any hearing difficulty with regard to whispered voices and some television programs.  They have deferred audiologic testing in the last year.  They do not  have excessive sun exposure. Discussed the need for sun protection: hats, long sleeves and use of sunscreen if there is significant sun exposure.   Diet: the importance of a healthy diet is discussed. They do have a healthy diet.  The benefits of regular aerobic exercise were discussed. She walks 4 times per week ,  20 minutes.   Depression screen: there are no signs or vegative symptoms of depression- irritability, change in appetite, anhedonia, sadness/tearfullness.  Cognitive assessment: the patient manages all their  financial and personal affairs and is actively engaged. They could relate day,date,year and events; recalled 2/3 objects at 3 minutes; performed clock-face test normally.  The following portions of the patient's history were reviewed and updated as appropriate: allergies, current medications, past family history, past medical history,  past surgical history, past social history  and problem list.  Visual acuity was not assessed per patient preference since she has regular follow up with her ophthalmologist. Hearing and body mass index were assessed and reviewed.   During the course of the visit the patient was educated and counseled about appropriate screening and preventive services including : fall prevention , diabetes screening, nutrition counseling, colorectal cancer screening, and recommended immunizations.    CC: The primary encounter diagnosis was Pure hypercholesterolemia. Diagnoses of GERD without esophagitis, Encounter for screening mammogram for malignant neoplasm of breast, Routine general medical examination at a health care facility, History of tobacco abuse, and Essential hypertension were also pertinent to this visit.  History Melanie Fisher has a past medical history of Allergy, Anxiety, Asthma, Depression, GERD (gastroesophageal reflux disease), Heart murmur, Hyperlipidemia, Hypertension, Sleep apnea, and Tobacco abuse.   She has a past surgical history that includes Abdominal hysterectomy (2008); right toe bunionectomy; right wrist ganglion cyst removal; and Colonoscopy with propofol (N/A, 06/04/2018).   Her family history includes Alcohol abuse in her father; COPD in her mother; Cancer in her sister; Cancer (age of onset: 19) in her maternal uncle; Colon cancer in her maternal uncle; Diabetes in her father and sister; Heart disease in her  father; Hypertension in her father; Kidney disease (age of onset: 24) in her mother.She reports that she has quit smoking. Her smoking use included  cigarettes. She has a 10.50 pack-year smoking history. She has never used smokeless tobacco. She reports previous alcohol use. She reports that she does not use drugs.  Social Hx:  Since hr last visit she has become engaged and has given up smoking and drinking   Outpatient Medications Prior to Visit  Medication Sig Dispense Refill  . acyclovir (ZOVIRAX) 400 MG tablet TAKE 1 TABLET BY MOUTH ONCE DAILY FOR  PREVENTION  OF  OUTBREAK 90 tablet 0  . cyclobenzaprine (FLEXERIL) 5 MG tablet Take 1 tablet (5 mg total) by mouth 3 (three) times daily as needed for muscle spasms. 90 tablet 3  . isosorbide mononitrate (IMDUR) 30 MG 24 hr tablet Take 1 tablet by mouth once daily 90 tablet 0  . metoprolol succinate (TOPROL-XL) 50 MG 24 hr tablet TAKE 1 TABLET BY MOUTH ONCE DAILY. TAKE WITH OR IMMEDIATELY FOLLOWING A MEAL. 90 tablet 0  . amLODipine (NORVASC) 5 MG tablet Take 1 tablet (5 mg total) by mouth daily. 30 tablet 1  . celecoxib (CELEBREX) 200 MG capsule Take 1 capsule by mouth once daily 90 capsule 0  . citalopram (CELEXA) 20 MG tablet Take 1 tablet by mouth once daily 90 tablet 0  . omeprazole (PRILOSEC) 40 MG capsule Take 1 capsule (40 mg total) by mouth daily. 90 capsule 1  . varenicline (CHANTIX CONTINUING MONTH PAK) 1 MG tablet Take 1 tablet (1 mg total) by mouth 2 (two) times daily. 60 tablet 3  . fluticasone (FLONASE) 50 MCG/ACT nasal spray Place 2 sprays into both nostrils daily. (Patient not taking: Reported on 02/06/2020) 16 g 6  . HYDROcodone-acetaminophen (NORCO/VICODIN) 5-325 MG tablet Take 1 tablet by mouth every 6 (six) hours as needed for moderate pain. (Patient not taking: Reported on 02/06/2020) 10 tablet 0   No facility-administered medications prior to visit.    Review of Systems   Patient denies headache, fevers, malaise, unintentional weight loss, skin rash, eye pain, sinus congestion and sinus pain, sore throat, dysphagia,  hemoptysis , cough, dyspnea, wheezing, chest pain,  palpitations, orthopnea, edema, abdominal pain, nausea, melena, diarrhea, constipation, flank pain, dysuria, hematuria, urinary  Frequency, nocturia, numbness, tingling, seizures,  Focal weakness, Loss of consciousness,  Tremor, insomnia, depression, anxiety, and suicidal ideation.      Objective:  BP 122/78 (BP Location: Left Arm, Patient Position: Sitting, Cuff Size: Normal)   Pulse 70   Temp 98 F (36.7 C) (Oral)   Resp 14   Ht 5\' 4"  (1.626 m)   Wt 177 lb 12.8 oz (80.6 kg)   SpO2 98%   BMI 30.52 kg/m   Physical Exam   General appearance: alert, cooperative and appears stated age Head: Normocephalic, without obvious abnormality, atraumatic Eyes: conjunctivae/corneas clear. PERRL, EOM's intact. Fundi benign. Ears: normal TM's and external ear canals both ears Nose: Nares normal. Septum midline. Mucosa normal. No drainage or sinus tenderness. Throat: lips, mucosa, and tongue normal; teeth and gums normal Neck: no adenopathy, no carotid bruit, no JVD, supple, symmetrical, trachea midline and thyroid not enlarged, symmetric, no tenderness/mass/nodules Lungs: clear to auscultation bilaterally Breasts: normal appearance, no masses or tenderness Heart: regular rate and rhythm, S1, S2 normal, no murmur, click, rub or gallop Abdomen: soft, non-tender; bowel sounds normal; no masses,  no organomegaly Extremities: extremities normal, atraumatic, no cyanosis or edema Pulses: 2+ and symmetric Skin: Skin  color excessively tanned, texture, turgor normal. No rashes or lesions Neurologic: Alert and oriented X 3, normal strength and tone. Normal symmetric reflexes. Normal coordination and gait.   Assessment & Plan:   Problem List Items Addressed This Visit      Unprioritized   Hyperlipidemia - Primary   Relevant Medications   amLODipine (NORVASC) 5 MG tablet   Other Relevant Orders   Lipid panel   Comprehensive metabolic panel   TSH   Essential hypertension    Well controlled on  current regimen. Renal function is du e, no changes today.      Relevant Medications   amLODipine (NORVASC) 5 MG tablet   History of tobacco abuse    Recently quit with help of Chantix.  Recommend continued use since she is tolerating it       Routine general medical examination at a health care facility    age appropriate education and counseling updated, referrals for preventative services and immunizations addressed, dietary and smoking counseling addressed, most recent labs reviewed.  I have personally reviewed and have noted:  1) the patient's medical and social history 2) The pt's use of alcohol, tobacco, and illicit drugs 3) The patient's current medications and supplements 4) Functional ability including ADL's, fall risk, home safety risk, hearing and visual impairment 5) Diet and physical activities 6) Evidence for depression or mood disorder 7) The patient's height, weight, and BMI have been recorded in the chart  I have made referrals, and provided counseling and education based on review of the above       Other Visit Diagnoses    GERD without esophagitis       Relevant Medications   omeprazole (PRILOSEC) 40 MG capsule   Encounter for screening mammogram for malignant neoplasm of breast       Relevant Orders   MM 3D SCREEN BREAST BILATERAL      I have discontinued Mardene Celeste L. Huffines's fluticasone and HYDROcodone-acetaminophen. I have also changed her celecoxib and citalopram. Additionally, I am having her maintain her cyclobenzaprine, isosorbide mononitrate, acyclovir, metoprolol succinate, amLODipine, omeprazole, and varenicline.  Meds ordered this encounter  Medications  . amLODipine (NORVASC) 5 MG tablet    Sig: Take 1 tablet (5 mg total) by mouth daily.    Dispense:  90 tablet    Refill:  1  . celecoxib (CELEBREX) 200 MG capsule    Sig: Take 1 capsule (200 mg total) by mouth daily.    Dispense:  90 capsule    Refill:  1  . citalopram (CELEXA) 20 MG  tablet    Sig: Take 1 tablet (20 mg total) by mouth daily.    Dispense:  90 tablet    Refill:  0  . omeprazole (PRILOSEC) 40 MG capsule    Sig: Take 1 capsule (40 mg total) by mouth daily.    Dispense:  90 capsule    Refill:  1  . varenicline (CHANTIX CONTINUING MONTH PAK) 1 MG tablet    Sig: Take 1 tablet (1 mg total) by mouth 2 (two) times daily.    Dispense:  60 tablet    Refill:  3    Medications Discontinued During This Encounter  Medication Reason  . fluticasone (FLONASE) 50 MCG/ACT nasal spray   . HYDROcodone-acetaminophen (NORCO/VICODIN) 5-325 MG tablet   . amLODipine (NORVASC) 5 MG tablet Reorder  . omeprazole (PRILOSEC) 40 MG capsule Reorder  . varenicline (CHANTIX CONTINUING MONTH PAK) 1 MG tablet Reorder  . celecoxib (CELEBREX) 200  MG capsule Reorder  . citalopram (CELEXA) 20 MG tablet Reorder    Follow-up: Return in about 6 months (around 08/08/2020).   Crecencio Mc, MD

## 2020-02-08 NOTE — Assessment & Plan Note (Signed)
Recently quit with help of Chantix.  Recommend continued use since she is tolerating it

## 2020-02-10 ENCOUNTER — Other Ambulatory Visit: Payer: Self-pay

## 2020-02-10 ENCOUNTER — Other Ambulatory Visit (INDEPENDENT_AMBULATORY_CARE_PROVIDER_SITE_OTHER): Payer: BC Managed Care – PPO

## 2020-02-10 DIAGNOSIS — E7800 Pure hypercholesterolemia, unspecified: Secondary | ICD-10-CM

## 2020-02-10 LAB — COMPREHENSIVE METABOLIC PANEL
ALT: 12 U/L (ref 0–35)
AST: 14 U/L (ref 0–37)
Albumin: 4.3 g/dL (ref 3.5–5.2)
Alkaline Phosphatase: 65 U/L (ref 39–117)
BUN: 23 mg/dL (ref 6–23)
CO2: 27 mEq/L (ref 19–32)
Calcium: 10 mg/dL (ref 8.4–10.5)
Chloride: 104 mEq/L (ref 96–112)
Creatinine, Ser: 0.89 mg/dL (ref 0.40–1.20)
GFR: 65.73 mL/min (ref >60.00)
Glucose, Bld: 75 mg/dL (ref 70–99)
Potassium: 4.1 mEq/L (ref 3.5–5.1)
Sodium: 137 mEq/L (ref 135–145)
Total Bilirubin: 0.4 mg/dL (ref 0.2–1.2)
Total Protein: 7.4 g/dL (ref 6.0–8.3)

## 2020-02-10 LAB — TSH: TSH: 4.75 u[IU]/mL — ABNORMAL HIGH (ref 0.35–4.50)

## 2020-02-10 LAB — LIPID PANEL
Cholesterol: 205 mg/dL — ABNORMAL HIGH (ref 0–200)
HDL: 56 mg/dL (ref >39.00)
LDL Cholesterol: 136 mg/dL — ABNORMAL HIGH (ref 0–99)
NonHDL: 149.3
Total CHOL/HDL Ratio: 4
Triglycerides: 69 mg/dL (ref 0.0–149.0)
VLDL: 13.8 mg/dL (ref 0.0–40.0)

## 2020-02-17 ENCOUNTER — Ambulatory Visit
Admission: RE | Admit: 2020-02-17 | Discharge: 2020-02-17 | Disposition: A | Discharge status: Home / Self Care | Payer: BC Managed Care – PPO | Source: Ambulatory Visit | Attending: Emergency Medicine | Admitting: Emergency Medicine

## 2020-02-17 ENCOUNTER — Other Ambulatory Visit: Payer: Self-pay

## 2020-02-17 VITALS — BP 130/83 | HR 60 | Temp 98.3°F | Resp 16

## 2020-02-17 DIAGNOSIS — R5383 Other fatigue: Secondary | ICD-10-CM

## 2020-02-17 DIAGNOSIS — H1012 Acute atopic conjunctivitis, left eye: Secondary | ICD-10-CM | POA: Diagnosis not present

## 2020-02-17 NOTE — Discharge Instructions (Signed)
Your COVID test is pending.  You should self quarantine until the test result is back.    Take Tylenol as needed for fever or discomfort.  Rest and keep yourself hydrated.    Go to the emergency department if you develop acute worsening symptoms.     

## 2020-02-17 NOTE — ED Triage Notes (Signed)
Patient reports watery left eye since Saturday morning. Denies photosensitivity or visual disturbances. Patient is also requesting to be covid tested today.

## 2020-02-17 NOTE — ED Provider Notes (Signed)
[name]    CSN: [id number] Arrival date & time: 02/17/20  1062      History   Chief Complaint Chief Complaint  Patient presents with  . COVID Test  . Eye Problem    HPI Melanie Fisher is a 54 y.o. female.   Patient presents with watery left eye x2 days.  She denies eye pain or changes in her vision.  She also reports fatigue and body aches.  She states she had a mild nonproductive cough yesterday evening.  She denies fever, chills, sore throat, shortness of breath, abdominal pain, vomiting, diarrhea, rash, or other symptoms.  Treatment attempted at home with NyQuil.  The history is provided by the patient.    Past Medical History:  Diagnosis Date  . Allergy   . Anxiety   . Asthma   . Depression   . GERD (gastroesophageal reflux disease)   . Heart murmur   . Hyperlipidemia   . Hypertension   . Sleep apnea   . Tobacco abuse     Patient Active Problem List   Diagnosis Date Noted  . Herpes simplex infection 05/28/2019  . Acute midline thoracic back pain 04/01/2019  . Candida vaginitis 05/22/2018  . Widowed 05/21/2018  . Tubular adenoma of colon 05/21/2018  . B12 deficiency 05/21/2018  . COPD exacerbation ([place]) 12/15/2016  . Insomnia due to anxiety and fear 04/10/2015  . Polyneuropathy 02/10/2015  . Cervical disc disorder with radiculopathy of cervical region 12/25/2014  . Essential hypertension 08/19/2014  . Rib pain on left side 03/23/2014  . Degenerative disc disease, thoracic 03/23/2014  . Nonallopathic lesion of thoracic region 03/23/2014  . Nonallopathic lesion-rib cage 03/23/2014  . Degenerative disk disease 02/12/2014  . Personal history of colonic polyps 02/11/2014  . Other chest pain 11/18/2013  . Obesity 07/01/2013  . History of tobacco abuse 03/15/2013  . Rash of unknown cause 02/18/2013  . Routine general medical examination at a health care facility 09/04/2012  . COPD (chronic obstructive pulmonary disease) ([place]) 05/26/2012  .  Chronic shoulder pain 04/09/2012  . Generalized anxiety disorder 04/06/2012  . Tobacco abuse 04/06/2012  . Depression with anxiety   . Heart murmur   . Hyperlipidemia   . Allergy     Past Surgical History:  Procedure Laterality Date  . ABDOMINAL HYSTERECTOMY  2008   partial  . COLONOSCOPY WITH PROPOFOL N/A 06/04/2018   Procedure: COLONOSCOPY WITH PROPOFOL;  Surgeon: Jonathon Bellows, MD;  Location: [facility] ENDOSCOPY;  Service: Gastroenterology;  Laterality: N/A;  . right toe bunionectomy    . right wrist ganglion cyst removal      OB History   No obstetric history on file.      Home Medications    Prior to Admission medications   Medication Sig Start Date End Date Taking? Authorizing Provider  acyclovir (ZOVIRAX) 400 MG tablet TAKE 1 TABLET BY MOUTH ONCE DAILY FOR  PREVENTION  OF  OUTBREAK 01/19/20   Crecencio Mc, MD  amLODipine (NORVASC) 5 MG tablet Take 1 tablet (5 mg total) by mouth daily. 02/06/20   Crecencio Mc, MD  celecoxib (CELEBREX) 200 MG capsule Take 1 capsule (200 mg total) by mouth daily. 02/06/20   Crecencio Mc, MD  citalopram (CELEXA) 20 MG tablet Take 1 tablet (20 mg total) by mouth daily. 02/06/20   Crecencio Mc, MD  cyclobenzaprine (FLEXERIL) 5 MG tablet Take 1 tablet (5 mg total) by mouth 3 (three) times daily as needed for muscle  spasms. 04/01/19   Crecencio Mc, MD  isosorbide mononitrate (IMDUR) 30 MG 24 hr tablet Take 1 tablet by mouth once daily 01/15/20   Crecencio Mc, MD  metoprolol succinate (TOPROL-XL) 50 MG 24 hr tablet TAKE 1 TABLET BY MOUTH ONCE DAILY. TAKE WITH OR IMMEDIATELY FOLLOWING A MEAL. 01/26/20   Crecencio Mc, MD  omeprazole (PRILOSEC) 40 MG capsule Take 1 capsule (40 mg total) by mouth daily. 02/06/20   Crecencio Mc, MD  varenicline (CHANTIX CONTINUING MONTH PAK) 1 MG tablet Take 1 tablet (1 mg total) by mouth 2 (two) times daily. 02/06/20   Crecencio Mc, MD    Family History Family History  Problem Relation Age of Onset  .  Kidney disease Mother 10       ESKD, partial neprhectomy  . COPD Mother   . Heart disease Father        CABG at age 79  . Alcohol abuse Father        until late 48  . Diabetes Father   . Hypertension Father   . Diabetes Sister   . Cancer Sister        uterine ca  . Cancer Maternal Uncle 60       stage 4 colon CA  . Colon cancer Maternal Uncle   . Esophageal cancer Neg Hx   . Pancreatic cancer Neg Hx   . Rectal cancer Neg Hx   . Stomach cancer Neg Hx   . Breast cancer Neg Hx     Social History Social History   Tobacco Use  . Smoking status: Former Smoker    Packs/day: 0.50    Years: 21.00    Pack years: 10.50    Types: Cigarettes  . Smokeless tobacco: Never Used  Vaping Use  . Vaping Use: Never used  Substance Use Topics  . Alcohol use: Not Currently    Comment: quit drinking   . Drug use: No     Allergies   Patient has no known allergies.   Review of Systems Review of Systems  Constitutional: Positive for fatigue. Negative for chills and fever.  HENT: Negative for ear pain and sore throat.   Eyes: Negative for pain and visual disturbance.  Respiratory: Positive for cough. Negative for shortness of breath.   Cardiovascular: Negative for chest pain and palpitations.  Gastrointestinal: Negative for abdominal pain, diarrhea and vomiting.  Genitourinary: Negative for dysuria and hematuria.  Musculoskeletal: Negative for arthralgias and back pain.  Skin: Negative for color change and rash.  Neurological: Negative for seizures and syncope.  All other systems reviewed and are negative.    Physical Exam Triage Vital Signs ED Triage Vitals  Enc Vitals Group     BP      Pulse      Resp      Temp      Temp src      SpO2      Weight      Height      Head Circumference      Peak Flow      Pain Score      Pain Loc      Pain Edu?      Excl. in [place]?    No data found.  Updated Vital Signs BP 130/83   Pulse 60   Temp 98.3 F (36.8 C)   Resp 16    SpO2 97%   Visual Acuity Right Eye Distance:   Left Eye  Distance:   Bilateral Distance:    Right Eye Near:   Left Eye Near:    Bilateral Near:     Physical Exam Vitals and nursing note reviewed.  Constitutional:      General: She is not in acute distress.    Appearance: She is well-developed. She is not ill-appearing.  HENT:     Head: Normocephalic and atraumatic.     Right Ear: Tympanic membrane normal.     Left Ear: Tympanic membrane normal.     Nose: Nose normal.     Mouth/Throat:     Mouth: Mucous membranes are moist.     Pharynx: Oropharynx is clear.  Eyes:     General:        Right eye: No discharge.        Left eye: No discharge.     Extraocular Movements: Extraocular movements intact.     Conjunctiva/sclera: Conjunctivae normal.     Pupils: Pupils are equal, round, and reactive to light.  Cardiovascular:     Rate and Rhythm: Normal rate and regular rhythm.     Heart sounds: Normal heart sounds. No murmur heard.   Pulmonary:     Effort: Pulmonary effort is normal. No respiratory distress.     Breath sounds: Normal breath sounds. No wheezing or rhonchi.  Abdominal:     Palpations: Abdomen is soft.     Tenderness: There is no abdominal tenderness. There is no guarding or rebound.  Musculoskeletal:     Cervical back: Neck supple.  Skin:    General: Skin is warm and dry.     Findings: No rash.  Neurological:     General: No focal deficit present.     Mental Status: She is alert and oriented to person, place, and time.     Gait: Gait normal.  Psychiatric:        Mood and Affect: Mood normal.        Behavior: Behavior normal.      UC Treatments / Results  Labs (all labs ordered are listed, but only abnormal results are displayed) Labs Reviewed  NOVEL CORONAVIRUS, NAA    EKG   Radiology No results found.  Procedures Procedures (including critical care time)  Medications Ordered in UC Medications - No data to display  Initial Impression /  Assessment and Plan / UC Course  I have reviewed the triage vital signs and the nursing notes.  Pertinent labs & imaging results that were available during my care of the patient were reviewed by me and considered in my medical decision making (see chart for details).   Allergic conjunctivitis of the left eye.  Fatigue.  Discussed symptomatic treatment for allergic conjunctivitis.  PCR COVID pending.  Instructed patient to self quarantine until the test result is back.  Discussed symptomatic treatment with Tylenol, rest, hydration.  Instructed her to go to the ED if she has acute worsening symptoms.  Patient agrees to plan of care.  Final Clinical Impressions(s) / UC Diagnoses   Final diagnoses:  Allergic conjunctivitis of left eye  Fatigue, unspecified type     Discharge Instructions     Your COVID test is pending.  You should self quarantine until the test result is back.    Take Tylenol as needed for fever or discomfort.  Rest and keep yourself hydrated.    Go to the emergency department if you develop acute worsening symptoms.        ED Prescriptions    None  PDMP not reviewed this encounter.   Sharion Balloon, NP 02/17/20 1042

## 2020-02-19 LAB — NOVEL CORONAVIRUS, NAA: SARS-CoV-2, NAA: NOT DETECTED

## 2020-02-20 ENCOUNTER — Encounter: Payer: Self-pay | Admitting: Nurse Practitioner

## 2020-02-20 ENCOUNTER — Telehealth (INDEPENDENT_AMBULATORY_CARE_PROVIDER_SITE_OTHER): Payer: BC Managed Care – PPO | Admitting: Nurse Practitioner

## 2020-02-20 ENCOUNTER — Other Ambulatory Visit: Payer: Self-pay

## 2020-02-20 VITALS — BP 140/92 | HR 65 | Temp 98.7°F | Ht 64.0 in | Wt 179.0 lb

## 2020-02-20 DIAGNOSIS — J069 Acute upper respiratory infection, unspecified: Secondary | ICD-10-CM

## 2020-02-20 NOTE — Patient Instructions (Addendum)
You are improving from when your symptoms started on Saturday.  It seems like you have a mild virus.  It is good that your Covid test was negative.  Please continue to monitor for resolution.  The spot on your tongue needs to be evaluated by your dentist if it persists.   Avoid Tylenol during the day and see if you spike of fever.  That would be important to know.  Rest, hydrate and call back next week or seek an in-person evaluation if the symptoms worsen or if the condition fails to improve as anticipated.   Viral Respiratory Infection A viral respiratory infection is an illness that affects parts of the body that are used for breathing. These include the lungs, nose, and throat. It is caused by a germ called a virus. Some examples of this kind of infection are:  A cold.  The flu (influenza).  A respiratory syncytial virus (RSV) infection. A person who gets this illness may have the following symptoms:  A stuffy or runny nose.  Yellow or green fluid in the nose.  A cough.  Sneezing.  Tiredness (fatigue).  Achy muscles.  A sore throat.  Sweating or chills.  A fever.  A headache. Follow these instructions at home: Managing pain and congestion  Take over-the-counter and prescription medicines only as told by your doctor.  If you have a sore throat, gargle with salt water. Do this 3-4 times per day or as needed. To make a salt-water mixture, dissolve -1 tsp of salt in 1 cup of warm water. Make sure that all the salt dissolves.  Use nose drops made from salt water. This helps with stuffiness (congestion). It also helps soften the skin around your nose.  Drink enough fluid to keep your pee (urine) pale yellow. General instructions   Rest as much as possible.  Do not drink alcohol.  Do not use any products that have nicotine or tobacco, such as cigarettes and e-cigarettes. If you need help quitting, ask your doctor.  Keep all follow-up visits as told by your  doctor. This is important. How is this prevented?   Get a flu shot every year. Ask your doctor when you should get your flu shot.  Do not let other people get your germs. If you are sick: ? Stay home from work or school. ? Wash your hands with soap and water often. Wash your hands after you cough or sneeze. If soap and water are not available, use hand sanitizer.  Avoid contact with people who are sick during cold and flu season. This is in fall and winter. Get help if:  Your symptoms last for 10 days or longer.  Your symptoms get worse over time.  You have a fever.  You have very bad pain in your face or forehead.  Parts of your jaw or neck become very swollen. Get help right away if:  You feel pain or pressure in your chest.  You have shortness of breath.  You faint or feel like you will faint.  You keep throwing up (vomiting).  You feel confused. Summary  A viral respiratory infection is an illness that affects parts of the body that are used for breathing.  Examples of this illness include a cold, the flu, and respiratory syncytial virus (RSV) infection.  The infection can cause a runny nose, cough, sneezing, sore throat, and fever.  Follow what your doctor tells you about taking medicines, drinking lots of fluid, washing your hands, resting at  home, and avoiding people who are sick. This information is not intended to replace advice given to you by your health care provider. Make sure you discuss any questions you have with your health care provider. Document Revised: 06/13/2018 Document Reviewed: 07/16/2017 Elsevier Patient Education  2020 [employer].

## 2020-02-20 NOTE — Progress Notes (Signed)
Virtual Visit via a virtual note  This visit type was conducted due to national recommendations for restrictions regarding the COVID-19 pandemic (e.g. social distancing).  This format is felt to be most appropriate for this patient at this time.  All issues noted in this document were discussed and addressed.  No physical exam was performed (except for noted visual exam findings with Video Visits).   I connected with@ on 02/20/20 at  3:30 PM EDT by a video enabled telemedicine application or telephone and verified that I am speaking with the correct person using two identifiers. Location patient: home Location provider: work  Persons participating in the virtual visit: patient, provider  I discussed the limitations, risks, security and privacy concerns of performing an evaluation and management service by telephone and the availability of in person appointments. I also discussed with the patient that there may be a patient responsible charge related to this service. The patient expressed understanding and agreed to proceed.  Reason for visit: Patient had cough and eye infection seemed to start on Saturday, and has improved.  Covid test was negative.  She has a red dot on her tongue.  HPI: This 54 year old patient reports onset of watery eyes that  began with her right  eye and then moved to the left eye.  She had a clear drainage that got matted in the morning. She also had a mild cough, decreased energy, and decreased appetite.  She tested negative for Covid.  She was diagnosed with allergic conjunctivitis.  She has been putting warm compresses on the eyes and they have improved.  She only gets a little matting in the morning. She has no nasal congestion, sore throat, body aches, fevers or chills.  Her cough is very slight at night.  She has taken Tylenol Cold and flu.  She is feeling much better today.  She is eating and drinking normally but her appetite is still off.  She has a flat red spot on her  right lateral tongue that is not sore or not ulcerated.  ROS: See pertinent positives and negatives per HPI.  Past Medical History:  Diagnosis Date  . Allergy   . Anxiety   . Asthma   . Depression   . GERD (gastroesophageal reflux disease)   . Heart murmur   . Hyperlipidemia   . Hypertension   . Sleep apnea   . Tobacco abuse     Past Surgical History:  Procedure Laterality Date  . ABDOMINAL HYSTERECTOMY  2008   partial  . COLONOSCOPY WITH PROPOFOL N/A 06/04/2018   Procedure: COLONOSCOPY WITH PROPOFOL;  Surgeon: Jonathon Bellows, MD;  Location: [facility] ENDOSCOPY;  Service: Gastroenterology;  Laterality: N/A;  . right toe bunionectomy    . right wrist ganglion cyst removal      Family History  Problem Relation Age of Onset  . Kidney disease Mother 62       ESKD, partial neprhectomy  . COPD Mother   . Heart disease Father        CABG at age 24  . Alcohol abuse Father        until late 74  . Diabetes Father   . Hypertension Father   . Diabetes Sister   . Cancer Sister        uterine ca  . Cancer Maternal Uncle 60       stage 4 colon CA  . Colon cancer Maternal Uncle   . Esophageal cancer Neg Hx   . Pancreatic cancer  Neg Hx   . Rectal cancer Neg Hx   . Stomach cancer Neg Hx   . Breast cancer Neg Hx     SOCIAL HX: former smoker   Current Outpatient Medications:  .  acyclovir (ZOVIRAX) 400 MG tablet, TAKE 1 TABLET BY MOUTH ONCE DAILY FOR  PREVENTION  OF  OUTBREAK, Disp: 90 tablet, Rfl: 0 .  amLODipine (NORVASC) 5 MG tablet, Take 1 tablet (5 mg total) by mouth daily., Disp: 90 tablet, Rfl: 1 .  celecoxib (CELEBREX) 200 MG capsule, Take 1 capsule (200 mg total) by mouth daily., Disp: 90 capsule, Rfl: 1 .  citalopram (CELEXA) 20 MG tablet, Take 1 tablet (20 mg total) by mouth daily., Disp: 90 tablet, Rfl: 0 .  cyclobenzaprine (FLEXERIL) 5 MG tablet, Take 1 tablet (5 mg total) by mouth 3 (three) times daily as needed for muscle spasms., Disp: 90 tablet, Rfl: 3 .  isosorbide  mononitrate (IMDUR) 30 MG 24 hr tablet, Take 1 tablet by mouth once daily, Disp: 90 tablet, Rfl: 0 .  metoprolol succinate (TOPROL-XL) 50 MG 24 hr tablet, TAKE 1 TABLET BY MOUTH ONCE DAILY. TAKE WITH OR IMMEDIATELY FOLLOWING A MEAL., Disp: 90 tablet, Rfl: 0 .  omeprazole (PRILOSEC) 40 MG capsule, Take 1 capsule (40 mg total) by mouth daily., Disp: 90 capsule, Rfl: 1 .  varenicline (CHANTIX CONTINUING MONTH PAK) 1 MG tablet, Take 1 tablet (1 mg total) by mouth 2 (two) times daily., Disp: 60 tablet, Rfl: 3  EXAM:  VITALS per patient if applicable:  GENERAL: alert, oriented, appears well and in no acute distress  HEENT: atraumatic, conjunctiva clear, no obvious abnormalities on inspection of external nose and ears.  Cannot get a good look at the tongue to identify  her red spot   NECK: normal movements of the head and neck  LUNGS: on inspection no signs of respiratory distress, breathing rate appears normal, no obvious gross SOB, gasping or wheezing  CV: no obvious cyanosis  MS: moves all visible extremities without noticeable abnormality  PSYCH/NEURO: pleasant and cooperative, no obvious depression or anxiety, speech and thought processing grossly intact  ASSESSMENT AND PLAN:  Discussed the following assessment and plan:  Viral upper respiratory tract infection  No problem-specific Assessment & Plan notes found for this encounter. Patient advised:  You are improving from when your symptoms started on Saturday.  It seems like you have a mild virus.  It is good that your Covid test was negative.  Please continue to monitor for resolution.  The spot on your tongue needs to be evaluated by your dentist if it persists.   Avoid Tylenol during the day and see if you spike of fever.  That would be important to know.  Rest, hydrate and call back next week or seek an in-person evaluation if the symptoms worsen or if the condition fails to improve as anticipated.    I discussed the  assessment and treatment plan with the patient. The patient was provided an opportunity to ask questions and all were answered. The patient agreed with the plan and demonstrated an understanding of the instructions.   The patient was advised to call back or seek an in-person evaluation if the symptoms worsen or if the condition fails to improve as anticipated.  I provided 10 minutes of non-face-to-face time during this encounter. Denice Paradise, NP Adult Nurse Practitioner [place] [phone]

## 2020-02-22 ENCOUNTER — Encounter: Payer: Self-pay | Admitting: Nurse Practitioner

## 2020-03-24 ENCOUNTER — Encounter: Payer: Self-pay | Admitting: Nurse Practitioner

## 2020-04-15 ENCOUNTER — Other Ambulatory Visit: Payer: Self-pay

## 2020-04-15 ENCOUNTER — Encounter: Payer: Self-pay | Admitting: Intensive Care

## 2020-04-15 ENCOUNTER — Emergency Department: Payer: BC Managed Care – PPO

## 2020-04-15 ENCOUNTER — Telehealth: Payer: Self-pay

## 2020-04-15 ENCOUNTER — Emergency Department
Admission: EM | Admit: 2020-04-15 | Discharge: 2020-04-15 | Disposition: A | Discharge status: Home / Self Care | Payer: BC Managed Care – PPO | Attending: Emergency Medicine | Admitting: Emergency Medicine

## 2020-04-15 ENCOUNTER — Ambulatory Visit: Payer: BC Managed Care – PPO | Admitting: Nurse Practitioner

## 2020-04-15 DIAGNOSIS — Z87891 Personal history of nicotine dependence: Secondary | ICD-10-CM | POA: Insufficient documentation

## 2020-04-15 DIAGNOSIS — M79604 Pain in right leg: Secondary | ICD-10-CM

## 2020-04-15 DIAGNOSIS — J45909 Unspecified asthma, uncomplicated: Secondary | ICD-10-CM | POA: Diagnosis not present

## 2020-04-15 DIAGNOSIS — J441 Chronic obstructive pulmonary disease with (acute) exacerbation: Secondary | ICD-10-CM | POA: Insufficient documentation

## 2020-04-15 DIAGNOSIS — M79661 Pain in right lower leg: Secondary | ICD-10-CM | POA: Insufficient documentation

## 2020-04-15 DIAGNOSIS — I10 Essential (primary) hypertension: Secondary | ICD-10-CM | POA: Insufficient documentation

## 2020-04-15 DIAGNOSIS — Z79899 Other long term (current) drug therapy: Secondary | ICD-10-CM | POA: Insufficient documentation

## 2020-04-15 MED ORDER — CYCLOBENZAPRINE HCL 10 MG PO TABS
10.0000 mg | ORAL_TABLET | Freq: Once | ORAL | Status: AC
  Administered 2020-04-15: 10 mg via ORAL
  Filled 2020-04-15: qty 1

## 2020-04-15 MED ORDER — LIDOCAINE 5 % EX PTCH: 1.0000 | MEDICATED_PATCH | Freq: Two times a day (BID) | CUTANEOUS | 0 refills | Status: DC

## 2020-04-15 MED ORDER — ORPHENADRINE CITRATE ER 100 MG PO TB12: 100.0000 mg | ORAL_TABLET | Freq: Two times a day (BID) | ORAL | 0 refills | Status: DC

## 2020-04-15 MED ORDER — NAPROXEN 500 MG PO TABS
500.0000 mg | ORAL_TABLET | Freq: Once | ORAL | Status: DC
  Filled 2020-04-15: qty 1

## 2020-04-15 MED ORDER — LIDOCAINE 5 % EX PTCH
1.0000 | MEDICATED_PATCH | CUTANEOUS | Status: DC
  Administered 2020-04-15: 1 via TRANSDERMAL
  Filled 2020-04-15: qty 1

## 2020-04-15 NOTE — Discharge Instructions (Addendum)
X-ray review right knee and ultrasound of your right calf reveals no acute findings.  Follow discharge care instruction take medication as directed.  Follow-up with PCP.

## 2020-04-15 NOTE — ED Provider Notes (Signed)
[facility] Emergency Department Provider Note   ____________________________________________   First MD Initiated Contact with Patient 04/15/20 1201     (approximate)  I have reviewed the triage vital signs and the nursing notes.   HISTORY  Chief Complaint Leg Pain (right)    HPI Melanie Fisher is a 54 y.o. female patient complain of right lower leg pain.  Patient states pain complaints dividing the 2 areas of her lower right leg.  First area of concern is the popliteal area and a secondary complaint is the right calf.  Onset of complaint for 1 week.  Denies dyspnea or chest pain.  No history of DVTs.  Patient is not taking blood thinners.  Rates the pain as a 6/10.  Described pain as "achy".  No palliative measure for complaint.         Past Medical History:  Diagnosis Date  . Allergy   . Anxiety   . Asthma   . Depression   . GERD (gastroesophageal reflux disease)   . Heart murmur   . Hyperlipidemia   . Hypertension   . Sleep apnea   . Tobacco abuse     Patient Active Problem List   Diagnosis Date Noted  . Herpes simplex infection 05/28/2019  . Acute midline thoracic back pain 04/01/2019  . Candida vaginitis 05/22/2018  . Widowed 05/21/2018  . Tubular adenoma of colon 05/21/2018  . B12 deficiency 05/21/2018  . COPD exacerbation ([place]) 12/15/2016  . Insomnia due to anxiety and fear 04/10/2015  . Polyneuropathy 02/10/2015  . Cervical disc disorder with radiculopathy of cervical region 12/25/2014  . Essential hypertension 08/19/2014  . Rib pain on left side 03/23/2014  . Degenerative disc disease, thoracic 03/23/2014  . Nonallopathic lesion of thoracic region 03/23/2014  . Nonallopathic lesion-rib cage 03/23/2014  . Degenerative disk disease 02/12/2014  . Personal history of colonic polyps 02/11/2014  . Other chest pain 11/18/2013  . Obesity 07/01/2013  . Viral upper respiratory tract infection 03/15/2013  . History of tobacco  abuse 03/15/2013  . Rash of unknown cause 02/18/2013  . Routine general medical examination at a health care facility 09/04/2012  . COPD (chronic obstructive pulmonary disease) ([place]) 05/26/2012  . Chronic shoulder pain 04/09/2012  . Generalized anxiety disorder 04/06/2012  . Tobacco abuse 04/06/2012  . Depression with anxiety   . Heart murmur   . Hyperlipidemia   . Allergy     Past Surgical History:  Procedure Laterality Date  . ABDOMINAL HYSTERECTOMY  2008   partial  . COLONOSCOPY WITH PROPOFOL N/A 06/04/2018   Procedure: COLONOSCOPY WITH PROPOFOL;  Surgeon: Jonathon Bellows, MD;  Location: [facility] ENDOSCOPY;  Service: Gastroenterology;  Laterality: N/A;  . right toe bunionectomy    . right wrist ganglion cyst removal      Prior to Admission medications   Medication Sig Start Date End Date Taking? Authorizing Provider  acyclovir (ZOVIRAX) 400 MG tablet TAKE 1 TABLET BY MOUTH ONCE DAILY FOR  PREVENTION  OF  OUTBREAK 01/19/20   Crecencio Mc, MD  amLODipine (NORVASC) 5 MG tablet Take 1 tablet (5 mg total) by mouth daily. 02/06/20   Crecencio Mc, MD  celecoxib (CELEBREX) 200 MG capsule Take 1 capsule (200 mg total) by mouth daily. 02/06/20   Crecencio Mc, MD  citalopram (CELEXA) 20 MG tablet Take 1 tablet (20 mg total) by mouth daily. 02/06/20   Crecencio Mc, MD  cyclobenzaprine (FLEXERIL) 5 MG tablet Take 1 tablet (5  mg total) by mouth 3 (three) times daily as needed for muscle spasms. 04/01/19   Crecencio Mc, MD  isosorbide mononitrate (IMDUR) 30 MG 24 hr tablet Take 1 tablet by mouth once daily 01/15/20   Crecencio Mc, MD  lidocaine (LIDODERM) 5 % Place 1 patch onto the skin every 12 (twelve) hours. Remove & Discard patch within 12 hours or as directed by MD 04/15/20 04/15/21  Sable Feil, PA-C  metoprolol succinate (TOPROL-XL) 50 MG 24 hr tablet TAKE 1 TABLET BY MOUTH ONCE DAILY. TAKE WITH OR IMMEDIATELY FOLLOWING A MEAL. 01/26/20   Crecencio Mc, MD  omeprazole (PRILOSEC)  40 MG capsule Take 1 capsule (40 mg total) by mouth daily. 02/06/20   Crecencio Mc, MD  orphenadrine (NORFLEX) 100 MG tablet Take 1 tablet (100 mg total) by mouth 2 (two) times daily. 04/15/20   Sable Feil, PA-C  varenicline (CHANTIX CONTINUING MONTH PAK) 1 MG tablet Take 1 tablet (1 mg total) by mouth 2 (two) times daily. 02/06/20   Crecencio Mc, MD    Allergies Patient has no known allergies.  Family History  Problem Relation Age of Onset  . Kidney disease Mother 33       ESKD, partial neprhectomy  . COPD Mother   . Heart disease Father        CABG at age 24  . Alcohol abuse Father        until late 61  . Diabetes Father   . Hypertension Father   . Diabetes Sister   . Cancer Sister        uterine ca  . Cancer Maternal Uncle 60       stage 4 colon CA  . Colon cancer Maternal Uncle   . Esophageal cancer Neg Hx   . Pancreatic cancer Neg Hx   . Rectal cancer Neg Hx   . Stomach cancer Neg Hx   . Breast cancer Neg Hx     Social History Social History   Tobacco Use  . Smoking status: Former Smoker    Packs/day: 0.50    Years: 21.00    Pack years: 10.50    Types: Cigarettes  . Smokeless tobacco: Never Used  Vaping Use  . Vaping Use: Never used  Substance Use Topics  . Alcohol use: Not Currently    Comment: quit drinking   . Drug use: No    Review of Systems Constitutional: No fever/chills Eyes: No visual changes. ENT: No sore throat. Cardiovascular: Denies chest pain. Respiratory: Denies shortness of breath. Gastrointestinal: No abdominal pain.  No nausea, no vomiting.  No diarrhea.  No constipation. Genitourinary: Negative for dysuria. Musculoskeletal: Right lower leg pain. Skin: Negative for rash. Neurological: Negative for headaches, focal weakness or numbness. Psychiatric:  Anxiety and depression Endocrine:  Hyperlipidemia and hypertension  ____________________________________________   PHYSICAL EXAM:  VITAL SIGNS: ED Triage Vitals  Enc  Vitals Group     BP 04/15/20 1138 128/83     Pulse Rate 04/15/20 1138 72     Resp 04/15/20 1138 16     Temp 04/15/20 1138 98.7 F (37.1 C)     Temp Source 04/15/20 1138 Oral     SpO2 04/15/20 1138 98 %     Weight 04/15/20 1139 180 lb (81.6 kg)     Height 04/15/20 1139 5\' 3"  (1.6 m)     Head Circumference --      Peak Flow --      Pain Score  04/15/20 1139 6     Pain Loc --      Pain Edu? --      Excl. in [place]? --     Constitutional: Alert and oriented. Well appearing and in no acute distress. Hematological/Lymphatic/Immunilogical: No cervical lymphadenopathy. Cardiovascular: Normal rate, regular rhythm. Grossly normal heart sounds.  Good peripheral circulation. Respiratory: Normal respiratory effort.  No retractions. Lungs CTAB. Musculoskeletal: No obvious deformity to right lower extremity.  Moderate guarding palpation right popliteal area and calf. Neurologic:  Normal speech and language. No gross focal neurologic deficits are appreciated. No gait instability. Skin:  Skin is warm, dry and intact. No rash noted. Psychiatric: Mood and affect are normal. Speech and behavior are normal.  ____________________________________________   LABS (all labs ordered are listed, but only abnormal results are displayed)  Labs Reviewed - No data to display ____________________________________________  EKG   ____________________________________________  RADIOLOGY I, Sable Feil, personally viewed and evaluated these images (plain radiographs) as part of my medical decision making, as well as reviewing the written report by the radiologist.  ED MD interpretation: No acute findings on x-ray of the right knee.  Official radiology report(s): DG Knee 2 Views Right  Result Date: 04/15/2020 CLINICAL DATA:  Right knee pain. EXAM: RIGHT KNEE - 1-2 VIEW COMPARISON:  None. FINDINGS: No evidence of fracture, dislocation, or joint effusion. No evidence of arthropathy or other focal bone  abnormality. Soft tissues are unremarkable. IMPRESSION: Negative. Electronically Signed   By: Misty Stanley M.D.   On: 04/15/2020 12:33   US Venous Img Lower Unilateral Right  Result Date: 04/15/2020 CLINICAL DATA:  Right calf pain EXAM: RIGHT LOWER EXTREMITY VENOUS DOPPLER ULTRASOUND TECHNIQUE: Gray-scale sonography with compression, as well as color and duplex ultrasound, were performed to evaluate the deep venous system(s) from the level of the common femoral vein through the popliteal and proximal calf veins. COMPARISON:  None. FINDINGS: VENOUS Normal compressibility of the common femoral, superficial femoral, and popliteal veins, as well as the visualized calf veins. Visualized portions of profunda femoral vein and great saphenous vein unremarkable. No filling defects to suggest DVT on grayscale or color Doppler imaging. Doppler waveforms show normal direction of venous flow, normal respiratory plasticity and response to augmentation. Limited views of the contralateral common femoral vein are unremarkable. OTHER None. Limitations: none IMPRESSION: No evidence of right lower extremity DVT. Electronically Signed   By: Macy Mis M.D.   On: 04/15/2020 14:48    ____________________________________________   PROCEDURES  Procedure(s) performed (including Critical Care):  Procedures   ____________________________________________   INITIAL IMPRESSION / ASSESSMENT AND PLAN / ED COURSE  As part of my medical decision making, I reviewed the following data within the [facility]     Patient presents with right popliteal and calf pain for 1 week.  Differentials consist of Baker's cyst and DVT.  Discussed no acute findings on x-ray of the right knee and ultrasound of the  right lower extremity.  Patient complaint physical exam consistent muscle skeletal pain.  Patient given discharge care instruction advised take medication as directed.           ____________________________________________   FINAL CLINICAL IMPRESSION(S) / ED DIAGNOSES  Final diagnoses:  Musculoskeletal pain of lower extremity, right     ED Discharge Orders         Ordered    lidocaine (LIDODERM) 5 %  Every 12 hours        04/15/20 1456    orphenadrine (  NORFLEX) 100 MG tablet  2 times daily        04/15/20 1456          *Please note:  Melanie Fisher was evaluated in Emergency Department on 04/15/2020 for the symptoms described in the history of present illness. She was evaluated in the context of the global COVID-19 pandemic, which necessitated consideration that the patient might be at risk for infection with the SARS-CoV-2 virus that causes COVID-19. Institutional protocols and algorithms that pertain to the evaluation of patients at risk for COVID-19 are in a state of rapid change based on information released by regulatory bodies including the CDC and federal and state organizations. These policies and algorithms were followed during the patient's care in the ED.  Some ED evaluations and interventions may be delayed as a result of limited staffing during and the pandemic.*   Note:  This document was prepared using Dragon voice recognition software and may include unintentional dictation errors.    Sable Feil, PA-C 04/15/20 1501    Carrie Mew, MD 04/19/20 [phone]

## 2020-04-15 NOTE — ED Triage Notes (Signed)
Patient c/o right calf tightness/pain/swelling X1 week. No recent injury. No hx blood clots. Able to ambulate

## 2020-04-15 NOTE — Telephone Encounter (Signed)
Pt called and states that she has been having right leg pain for a week. She states that it is now cramping and painful to walk on. Red and swollen. Transferred to [facility] to triage

## 2020-04-15 NOTE — Telephone Encounter (Signed)
Patient needs to have a STAT DVT rule ouT Do please send her to urgent care    DO NOT PUT HER IN AT 4;45 WITH KIN

## 2020-04-15 NOTE — Telephone Encounter (Signed)
Spoken to patient she has been having alittle pain behind her Right knee for 7 days. She stated she feels her sock is tighter on her right foot and her ankle looks slightly swollen. She denies having SOB, Headache, heart palpitations, blurry vision, fever, and chills. She has been using Biofreeze to help with the pain. She has a hx of injury to this leg and has DDG that sometimes affects her legs. Made appointment with Melanie Fisher for today at 1645.

## 2020-04-15 NOTE — Telephone Encounter (Signed)
Patient was instructed to go to UC to rule out DVT. Patient stated she will go to [facility] UC. Appointment for today has been canceled.

## 2020-04-24 ENCOUNTER — Other Ambulatory Visit: Payer: Self-pay | Admitting: Internal Medicine

## 2020-04-24 ENCOUNTER — Other Ambulatory Visit: Payer: Self-pay | Admitting: Physician Assistant

## 2020-04-24 DIAGNOSIS — K219 Gastro-esophageal reflux disease without esophagitis: Secondary | ICD-10-CM

## 2020-04-24 DIAGNOSIS — M62830 Muscle spasm of back: Secondary | ICD-10-CM

## 2020-04-24 DIAGNOSIS — I10 Essential (primary) hypertension: Secondary | ICD-10-CM

## 2020-05-03 ENCOUNTER — Ambulatory Visit
Admission: EM | Admit: 2020-05-03 | Discharge: 2020-05-03 | Disposition: A | Discharge status: Home / Self Care | Payer: BC Managed Care – PPO | Attending: Emergency Medicine | Admitting: Emergency Medicine

## 2020-05-03 DIAGNOSIS — J029 Acute pharyngitis, unspecified: Secondary | ICD-10-CM

## 2020-05-03 DIAGNOSIS — Z1152 Encounter for screening for COVID-19: Secondary | ICD-10-CM | POA: Insufficient documentation

## 2020-05-03 DIAGNOSIS — J069 Acute upper respiratory infection, unspecified: Secondary | ICD-10-CM

## 2020-05-03 LAB — POCT RAPID STREP A (OFFICE): Rapid Strep A Screen: NEGATIVE

## 2020-05-03 MED ORDER — BENZONATATE 100 MG PO CAPS: 100.0000 mg | ORAL_CAPSULE | Freq: Three times a day (TID) | ORAL | 0 refills | Status: DC

## 2020-05-03 NOTE — Discharge Instructions (Signed)
Your rapid strep test is negative.  A throat culture is pending; we will call you if it is positive requiring treatment.    Your COVID test is pending.  You should self quarantine until the test result is back.    Take Tessalon Perles as needed for cough.  Take Tylenol as needed for fever or discomfort.  Rest and keep yourself hydrated.    Follow up with your primary care provider if your symptoms are not improving.

## 2020-05-03 NOTE — ED Provider Notes (Signed)
Melanie Fisher    CSN: [id number] Arrival date & time: 05/03/20  2993      History   Chief Complaint Chief Complaint  Patient presents with  . Cough    HPI Melanie Fisher is a 54 y.o. female.   Patient presents with 4-day history of runny nose, nasal congestion, sore throat, headache and cough productive of yellow phlegm.  She denies fever, chills, rash, shortness of breath, vomiting, diarrhea, or other symptoms.  Treatment attempted at home with Robitussin and Tylenol.  Her medical history includes hypertension, heart murmur, COPD, asthma, tobacco abuse, sleep apnea, depression, anxiety.  The history is provided by the patient and medical records.    Past Medical History:  Diagnosis Date  . Allergy   . Anxiety   . Asthma   . Depression   . GERD (gastroesophageal reflux disease)   . Heart murmur   . Hyperlipidemia   . Hypertension   . Sleep apnea   . Tobacco abuse     Patient Active Problem List   Diagnosis Date Noted  . Herpes simplex infection 05/28/2019  . Acute midline thoracic back pain 04/01/2019  . Candida vaginitis 05/22/2018  . Widowed 05/21/2018  . Tubular adenoma of colon 05/21/2018  . B12 deficiency 05/21/2018  . COPD exacerbation ([place]) 12/15/2016  . Insomnia due to anxiety and fear 04/10/2015  . Polyneuropathy 02/10/2015  . Cervical disc disorder with radiculopathy of cervical region 12/25/2014  . Essential hypertension 08/19/2014  . Rib pain on left side 03/23/2014  . Degenerative disc disease, thoracic 03/23/2014  . Nonallopathic lesion of thoracic region 03/23/2014  . Nonallopathic lesion-rib cage 03/23/2014  . Degenerative disk disease 02/12/2014  . Personal history of colonic polyps 02/11/2014  . Other chest pain 11/18/2013  . Obesity 07/01/2013  . Viral upper respiratory tract infection 03/15/2013  . History of tobacco abuse 03/15/2013  . Rash of unknown cause 02/18/2013  . Routine general medical examination at a health care  facility 09/04/2012  . COPD (chronic obstructive pulmonary disease) ([place]) 05/26/2012  . Chronic shoulder pain 04/09/2012  . Generalized anxiety disorder 04/06/2012  . Tobacco abuse 04/06/2012  . Depression with anxiety   . Heart murmur   . Hyperlipidemia   . Allergy     Past Surgical History:  Procedure Laterality Date  . ABDOMINAL HYSTERECTOMY  2008   partial  . COLONOSCOPY WITH PROPOFOL N/A 06/04/2018   Procedure: COLONOSCOPY WITH PROPOFOL;  Surgeon: Jonathon Bellows, MD;  Location: [facility] ENDOSCOPY;  Service: Gastroenterology;  Laterality: N/A;  . right toe bunionectomy    . right wrist ganglion cyst removal      OB History   No obstetric history on file.      Home Medications    Prior to Admission medications   Medication Sig Start Date End Date Taking? Authorizing Provider  acyclovir (ZOVIRAX) 400 MG tablet TAKE 1 TABLET BY MOUTH ONCE DAILY FOR  PREVENTION  OF  OUTBREAK 04/26/20   Crecencio Mc, MD  amLODipine (NORVASC) 5 MG tablet Take 1 tablet (5 mg total) by mouth daily. 02/06/20   Crecencio Mc, MD  benzonatate (TESSALON) 100 MG capsule Take 1 capsule (100 mg total) by mouth every 8 (eight) hours. 05/03/20   Sharion Balloon, NP  celecoxib (CELEBREX) 200 MG capsule Take 1 capsule by mouth once daily 04/26/20   Crecencio Mc, MD  citalopram (CELEXA) 20 MG tablet Take 1 tablet (20 mg total) by mouth daily. 02/06/20   Derrel Nip,  Aris Everts, MD  cyclobenzaprine (FLEXERIL) 5 MG tablet Take 1 tablet by mouth three times daily as needed for muscle spasm 04/26/20   Crecencio Mc, MD  isosorbide mononitrate (IMDUR) 30 MG 24 hr tablet Take 1 tablet by mouth once daily 01/15/20   Crecencio Mc, MD  lidocaine (LIDODERM) 5 % Place 1 patch onto the skin every 12 (twelve) hours. Remove & Discard patch within 12 hours or as directed by MD 04/15/20 04/15/21  Sable Feil, PA-C  metoprolol succinate (TOPROL-XL) 50 MG 24 hr tablet TAKE 1 TABLET BY MOUTH ONCE DAILY WITH  OR  IMMEDIATELY  FOLLOWING   A  MEAL 04/26/20   Crecencio Mc, MD  omeprazole (PRILOSEC) 40 MG capsule Take 1 capsule by mouth once daily 04/26/20   Crecencio Mc, MD  orphenadrine (NORFLEX) 100 MG tablet Take 1 tablet (100 mg total) by mouth 2 (two) times daily. 04/15/20   Sable Feil, PA-C  varenicline (CHANTIX CONTINUING MONTH PAK) 1 MG tablet Take 1 tablet (1 mg total) by mouth 2 (two) times daily. 02/06/20   Crecencio Mc, MD    Family History Family History  Problem Relation Age of Onset  . Kidney disease Mother 77       ESKD, partial neprhectomy  . COPD Mother   . Heart disease Father        CABG at age 80  . Alcohol abuse Father        until late 75  . Diabetes Father   . Hypertension Father   . Diabetes Sister   . Cancer Sister        uterine ca  . Cancer Maternal Uncle 60       stage 4 colon CA  . Colon cancer Maternal Uncle   . Esophageal cancer Neg Hx   . Pancreatic cancer Neg Hx   . Rectal cancer Neg Hx   . Stomach cancer Neg Hx   . Breast cancer Neg Hx     Social History Social History   Tobacco Use  . Smoking status: Former Smoker    Packs/day: 0.50    Years: 21.00    Pack years: 10.50    Types: Cigarettes  . Smokeless tobacco: Never Used  Vaping Use  . Vaping Use: Never used  Substance Use Topics  . Alcohol use: Not Currently    Comment: quit drinking   . Drug use: No     Allergies   Patient has no known allergies.   Review of Systems Review of Systems  Constitutional: Negative for chills and fever.  HENT: Positive for congestion, postnasal drip, rhinorrhea and sore throat. Negative for ear pain.   Eyes: Negative for pain and visual disturbance.  Respiratory: Positive for cough. Negative for shortness of breath.   Cardiovascular: Negative for chest pain and palpitations.  Gastrointestinal: Negative for abdominal pain and vomiting.  Genitourinary: Negative for dysuria and hematuria.  Musculoskeletal: Negative for arthralgias and back pain.  Skin: Negative for  color change and rash.  Neurological: Positive for headaches. Negative for seizures, syncope, weakness and numbness.  All other systems reviewed and are negative.    Physical Exam Triage Vital Signs ED Triage Vitals  Enc Vitals Group     BP      Pulse      Resp      Temp      Temp src      SpO2      Weight  Height      Head Circumference      Peak Flow      Pain Score      Pain Loc      Pain Edu?      Excl. in [place]?    No data found.  Updated Vital Signs BP (!) 153/91 Comment: did not take BP med this morning  Pulse 67   Temp 98.6 F (37 C) (Oral)   Resp 20   Ht 5\' 3"  (1.6 m)   Wt 190 lb (86.2 kg)   SpO2 98%   BMI 33.66 kg/m   Visual Acuity Right Eye Distance:   Left Eye Distance:   Bilateral Distance:    Right Eye Near:   Left Eye Near:    Bilateral Near:     Physical Exam Vitals and nursing note reviewed.  Constitutional:      General: She is not in acute distress.    Appearance: She is well-developed.  HENT:     Head: Normocephalic and atraumatic.     Right Ear: Tympanic membrane normal.     Left Ear: Tympanic membrane normal.     Nose: Rhinorrhea present.     Mouth/Throat:     Mouth: Mucous membranes are moist.     Pharynx: Oropharynx is clear.  Eyes:     Conjunctiva/sclera: Conjunctivae normal.  Cardiovascular:     Rate and Rhythm: Normal rate and regular rhythm.     Heart sounds: Normal heart sounds.  Pulmonary:     Effort: Pulmonary effort is normal. No respiratory distress.     Breath sounds: Normal breath sounds. No wheezing or rhonchi.  Abdominal:     Palpations: Abdomen is soft.     Tenderness: There is no abdominal tenderness. There is no guarding or rebound.  Musculoskeletal:     Cervical back: Neck supple.  Skin:    General: Skin is warm and dry.     Findings: No rash.  Neurological:     General: No focal deficit present.     Mental Status: She is alert and oriented to person, place, and time.     Gait: Gait normal.    Psychiatric:        Mood and Affect: Mood normal.        Behavior: Behavior normal.      UC Treatments / Results  Labs (all labs ordered are listed, but only abnormal results are displayed) Labs Reviewed  NOVEL CORONAVIRUS, NAA  CULTURE, GROUP A STREP [facility])  POCT RAPID STREP A (OFFICE)    EKG   Radiology No results found.  Procedures Procedures (including critical care time)  Medications Ordered in UC Medications - No data to display  Initial Impression / Assessment and Plan / UC Course  I have reviewed the triage vital signs and the nursing notes.  Pertinent labs & imaging results that were available during my care of the patient were reviewed by me and considered in my medical decision making (see chart for details).    Viral URI with cough.  Rapid strep negative; culture pending.  PCR COVID pending.  Instructed patient to self quarantine until the test result is back.  Discussed symptomatic treatment including Tessalon Perles, Tylenol, rest, hydration.  Instructed patient to follow up with PCP if her symptoms are not improving  Patient agrees to plan of care.   Final Clinical Impressions(s) / UC Diagnoses   Final diagnoses:  Encounter for screening for COVID-19  Viral URI with cough  Discharge Instructions     Your rapid strep test is negative.  A throat culture is pending; we will call you if it is positive requiring treatment.    Your COVID test is pending.  You should self quarantine until the test result is back.    Take Tessalon Perles as needed for cough.  Take Tylenol as needed for fever or discomfort.  Rest and keep yourself hydrated.    Follow up with your primary care provider if your symptoms are not improving.          ED Prescriptions    Medication Sig Dispense Auth. Provider   benzonatate (TESSALON) 100 MG capsule Take 1 capsule (100 mg total) by mouth every 8 (eight) hours. 21 capsule Sharion Balloon, NP     PDMP not reviewed this  encounter.   Sharion Balloon, NP 05/03/20 [phone]

## 2020-05-03 NOTE — ED Triage Notes (Signed)
Pt reports having cough, runny nose and sore throat x4 days. Cough is productive. Painful to swallow. Also reports having a headache.

## 2020-05-04 ENCOUNTER — Telehealth: Payer: Self-pay

## 2020-05-04 ENCOUNTER — Encounter: Payer: Self-pay | Admitting: Nurse Practitioner

## 2020-05-04 LAB — NOVEL CORONAVIRUS, NAA: SARS-CoV-2, NAA: NOT DETECTED

## 2020-05-04 LAB — SARS-COV-2, NAA 2 DAY TAT

## 2020-05-04 NOTE — Telephone Encounter (Signed)
Patient was seen at an UC yesterday.     [place] Night - Cl TELEPHONE ADVICE RECORD [facility] Patient Name: Melanie Fisher Gender: Female DOB: [birth year] Age: 54 Y 86 M 28 D Return Phone Number: [phone] (Primary) Address: City/State/Zip: [place] [address] Client [facility] [place] Night - [facility] Client Site [facility] Physician Deborra Medina - MD Contact Type Call Who Is Calling Patient / Member / Family / Caregiver Call Type Triage / Clinical Relationship To Patient Self Return Phone Number [phone] (Primary) Chief Complaint BREATHING - shortness of breath or sounds breathless Reason for Call Symptomatic / Request for Melanie Fisher states she is having trouble breathing, cough, sore throat. Translation No Nurse Assessment Nurse: Wynetta Emery, RN, Baker Janus Date/Time Eilene Ghazi Time): 05/03/2020 7:37:47 AM Confirm and document reason for call. If symptomatic, describe symptoms. ---Mardene Celeste has cough, sore throat nasal congestion onset Saturday and having trouble breathing Does the patient have any new or worsening symptoms? ---Yes Will a triage be completed? ---Yes Related visit to physician within the last 2 weeks? ---No Does the PT have any chronic conditions? (i.e. diabetes, asthma, this includes High risk factors for pregnancy, etc.) ---Yes List chronic conditions. ---HTN Depression Gerds Arthritis DJD Hyperlipidemia Is this a behavioral health or substance abuse call? ---No Guidelines Guideline Title Affirmed Question Affirmed Notes Nurse Date/Time (Eastern Time) Cough - Acute Productive [1] MILD difficulty breathing (e.g., minimal/ no SOB at rest, SOB with walking, pulse <100) AND [2] still present when not coughing Ivin Booty 05/03/2020 7:40:31 AM Disp. Time Eilene Ghazi Time) Disposition Final User 05/03/2020 7:35:50 AM Send to Urgent  Queue Idolina Primer 05/03/2020 7:42:46 AM See HCP within 4 Hours (or PCP triage) Yes Wynetta Emery, RN, Baker Janus PLEASE NOTE: All timestamps contained within this report are represented as [place] Standard Time. CONFIDENTIALTY NOTICE: This fax transmission is intended only for the addressee. It contains information that is legally privileged, confidential or otherwise protected from use or disclosure. If you are not the intended recipient, you are strictly prohibited from reviewing, disclosing, copying using or disseminating any of this information or taking any action in reliance on or regarding this information. If you have received this fax in error, please notify us immediately by telephone so that we can arrange for its return to [place]. Phone: [phone], Toll-Free: [phone], Fax: [phone] Page: 2 of 2 Call Id: [id number] [place] Disagree/Comply Comply Caller Understands Yes PreDisposition Call Doctor Care Advice Given Per Guideline SEE HCP (OR PCP TRIAGE) WITHIN 4 HOURS: BRING MEDICINES: * Please bring a list of your current medicines when you go to see the doctor. * It is also a good idea to bring the pill bottles too. This will help the doctor to make certain you are taking the right medicines and the right dose. * You become worse CARE ADVICE given per Cough - Acute Productive (Adult) guideline. Referrals REFERRED TO PCP OFFICE

## 2020-05-05 ENCOUNTER — Telehealth (INDEPENDENT_AMBULATORY_CARE_PROVIDER_SITE_OTHER): Payer: BC Managed Care – PPO | Admitting: Internal Medicine

## 2020-05-05 ENCOUNTER — Encounter: Payer: Self-pay | Admitting: Internal Medicine

## 2020-05-05 DIAGNOSIS — M546 Pain in thoracic spine: Secondary | ICD-10-CM

## 2020-05-05 LAB — CULTURE, GROUP A STREP (THRC)

## 2020-05-05 MED ORDER — AMOXICILLIN-POT CLAVULANATE 875-125 MG PO TABS: 1.0000 | ORAL_TABLET | Freq: Two times a day (BID) | ORAL | 0 refills | Status: DC

## 2020-05-05 NOTE — Progress Notes (Signed)
Virtual Visit via Wanda  This visit type was conducted due to national recommendations for restrictions regarding the COVID-19 pandemic (e.g. social distancing).  This format is felt to be most appropriate for this patient at this time.  All issues noted in this document were discussed and addressed.  No physical exam was performed (except for noted visual exam findings with Video Visits).   I connected with@ on 05/05/20 at  4:30 PM EST by a video enabled telemedicine application  and verified that I am speaking with the correct person using two identifiers. Location patient: home Location provider: work or home office Persons participating in the virtual visit: patient, provider  I discussed the limitations, risks, security and privacy concerns of performing an evaluation and management service by telephone and the availability of in person appointments. I also discussed with the patient that there may be a patient responsible charge related to this service. The patient expressed understanding and agreed to proceed.   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  Symptoms started with sore throat and productive cough, rhinitis,.  No body aches or fevers.   WAS COVID AND STREP NEGATIVE 48 hours ago.   Pharyngitis and cough have resolved  But she has persistent sinus congestion and has developed bilateral ear pressure  that has not responded to decongestants and nasal decongestants    ROS: See pertinent positives and negatives per HPI.  Past Medical History:  Diagnosis Date  . Allergy   . Anxiety   . Asthma   . Depression   . GERD (gastroesophageal reflux disease)   . Heart murmur   . Hyperlipidemia   . Hypertension   . Sleep apnea   . Tobacco abuse     Past Surgical History:  Procedure Laterality Date  . ABDOMINAL HYSTERECTOMY  2008   partial  . COLONOSCOPY WITH PROPOFOL N/A 06/04/2018    Procedure: COLONOSCOPY WITH PROPOFOL;  Surgeon: Jonathon Bellows, MD;  Location[facility] ENDOSCOPY;  Service: Gastroenterology;  Laterality: N/A;  . right toe bunionectomy    . right wrist ganglion cyst removal      Family History  Problem Relation Age of Onset  . Kidney disease Mother 39       ESKD, partial neprhectomy  . COPD Mother   . Heart disease Father        CABG at age 80  . Alcohol abuse Father        until late 53  . Diabetes Father   . Hypertension Father   . Diabetes Sister   . Cancer Sister        uterine ca  . Cancer Maternal Uncle 60       stage 4 colon CA  . Colon cancer Maternal Uncle   . Esophageal cancer Neg Hx   . Pancreatic cancer Neg Hx   . Rectal cancer Neg Hx   . Stomach cancer Neg Hx   . Breast cancer Neg Hx     SOCIAL HX:  reports that she has quit smoking. Her smoking use included cigarettes. She has a 10.50 pack-year smoking history. She has never used smokeless tobacco. She reports previous alcohol use. She reports that she does not use drugs.   Current Outpatient Medications:  .  acyclovir (ZOVIRAX) 400 MG tablet, TAKE 1 TABLET BY MOUTH ONCE DAILY FOR  PREVENTION  OF  OUTBREAK, Disp: 90  tablet, Rfl: 0 .  amLODipine (NORVASC) 5 MG tablet, Take 1 tablet (5 mg total) by mouth daily., Disp: 90 tablet, Rfl: 1 .  benzonatate (TESSALON) 100 MG capsule, Take 1 capsule (100 mg total) by mouth every 8 (eight) hours., Disp: 21 capsule, Rfl: 0 .  celecoxib (CELEBREX) 200 MG capsule, Take 1 capsule by mouth once daily, Disp: 90 capsule, Rfl: 0 .  citalopram (CELEXA) 20 MG tablet, Take 1 tablet (20 mg total) by mouth daily., Disp: 90 tablet, Rfl: 0 .  cyclobenzaprine (FLEXERIL) 5 MG tablet, Take 1 tablet by mouth three times daily as needed for muscle spasm, Disp: 90 tablet, Rfl: 0 .  isosorbide mononitrate (IMDUR) 30 MG 24 hr tablet, Take 1 tablet by mouth once daily, Disp: 90 tablet, Rfl: 0 .  metoprolol succinate (TOPROL-XL) 50 MG 24 hr tablet, TAKE 1 TABLET BY  MOUTH ONCE DAILY WITH  OR  IMMEDIATELY  FOLLOWING  A  MEAL, Disp: 90 tablet, Rfl: 0 .  omeprazole (PRILOSEC) 40 MG capsule, Take 1 capsule by mouth once daily, Disp: 90 capsule, Rfl: 0 .  orphenadrine (NORFLEX) 100 MG tablet, Take 1 tablet (100 mg total) by mouth 2 (two) times daily., Disp: 10 tablet, Rfl: 0 .  varenicline (CHANTIX CONTINUING MONTH PAK) 1 MG tablet, Take 1 tablet (1 mg total) by mouth 2 (two) times daily., Disp: 60 tablet, Rfl: 3 .  amoxicillin-clavulanate (AUGMENTIN) 875-125 MG tablet, Take 1 tablet by mouth 2 (two) times daily., Disp: 14 tablet, Rfl: 0  EXAM:  VITALS per patient if applicable:  GENERAL: alert, oriented, appears well and in no acute distress  HEENT: atraumatic, conjunttiva clear, no obvious abnormalities on inspection of external nose and ears  NECK: normal movements of the head and neck  LUNGS: on inspection no signs of respiratory distress, breathing rate appears normal, no obvious gross SOB, gasping or wheezing  CV: no obvious cyanosis  MS: moves all visible extremities without noticeable abnormality  PSYCH/NEURO: pleasant and cooperative, no obvious depression or anxiety, speech and thought processing grossly intact  ASSESSMENT AND PLAN:  Discussed the following assessment and plan:  Acute midline thoracic back pain  Acute midline thoracic back pain Adding augmentin due to persistent symptoms despite use of oral and nasal decongestants and new onset ear pain . COVID TESTING WAS NEGATIVE AT FACILITY     I discussed the assessment and treatment plan with the patient. The patient was provided an opportunity to ask questions and all were answered. The patient agreed with the plan and demonstrated an understanding of the instructions.   The patient was advised to call back or seek an in-person evaluation if the symptoms worsen or if the condition fails to improve as anticipated.  I provided 20 minutes of non-face-to-face time during this  encounter.   Crecencio Mc, MD

## 2020-05-06 NOTE — Telephone Encounter (Signed)
Patient was seen yesterday afternoon by Dr. Derrel Nip

## 2020-05-08 NOTE — Assessment & Plan Note (Addendum)
Adding augmentin due to persistent symptoms despite use of oral and nasal decongestants and new onset ear pain . COVID TESTING WAS NEGATIVE AT FACILITY

## 2020-05-17 ENCOUNTER — Other Ambulatory Visit: Payer: Self-pay

## 2020-05-17 ENCOUNTER — Ambulatory Visit
Admission: RE | Admit: 2020-05-17 | Discharge: 2020-05-17 | Disposition: A | Discharge status: Home / Self Care | Payer: BC Managed Care – PPO | Source: Ambulatory Visit | Attending: Internal Medicine | Admitting: Internal Medicine

## 2020-05-17 DIAGNOSIS — Z1231 Encounter for screening mammogram for malignant neoplasm of breast: Secondary | ICD-10-CM | POA: Insufficient documentation

## 2020-06-24 ENCOUNTER — Other Ambulatory Visit: Payer: Self-pay | Admitting: Internal Medicine

## 2020-06-30 ENCOUNTER — Encounter: Payer: Self-pay | Admitting: Internal Medicine

## 2020-06-30 ENCOUNTER — Other Ambulatory Visit (INDEPENDENT_AMBULATORY_CARE_PROVIDER_SITE_OTHER): Payer: BC Managed Care – PPO

## 2020-06-30 ENCOUNTER — Ambulatory Visit
Admission: RE | Admit: 2020-06-30 | Discharge: 2020-06-30 | Disposition: A | Discharge status: Home / Self Care | Payer: BC Managed Care – PPO | Attending: Internal Medicine | Admitting: Internal Medicine

## 2020-06-30 ENCOUNTER — Telehealth: Payer: Self-pay | Admitting: Internal Medicine

## 2020-06-30 ENCOUNTER — Ambulatory Visit
Admission: RE | Admit: 2020-06-30 | Discharge: 2020-06-30 | Disposition: A | Discharge status: Home / Self Care | Payer: BC Managed Care – PPO | Source: Ambulatory Visit | Attending: Internal Medicine | Admitting: Internal Medicine

## 2020-06-30 ENCOUNTER — Other Ambulatory Visit: Payer: Self-pay

## 2020-06-30 ENCOUNTER — Telehealth (INDEPENDENT_AMBULATORY_CARE_PROVIDER_SITE_OTHER): Payer: BC Managed Care – PPO | Admitting: Internal Medicine

## 2020-06-30 VITALS — BP 113/77 | HR 67 | Ht 63.0 in | Wt 190.0 lb

## 2020-06-30 DIAGNOSIS — J029 Acute pharyngitis, unspecified: Secondary | ICD-10-CM | POA: Diagnosis not present

## 2020-06-30 DIAGNOSIS — Z20822 Contact with and (suspected) exposure to covid-19: Secondary | ICD-10-CM

## 2020-06-30 DIAGNOSIS — R059 Cough, unspecified: Secondary | ICD-10-CM | POA: Insufficient documentation

## 2020-06-30 DIAGNOSIS — J40 Bronchitis, not specified as acute or chronic: Secondary | ICD-10-CM | POA: Insufficient documentation

## 2020-06-30 DIAGNOSIS — J4521 Mild intermittent asthma with (acute) exacerbation: Secondary | ICD-10-CM

## 2020-06-30 LAB — POCT RAPID STREP A (OFFICE): Rapid Strep A Screen: NEGATIVE

## 2020-06-30 MED ORDER — PREDNISONE 20 MG PO TABS: 40.0000 mg | ORAL_TABLET | Freq: Every day | ORAL | 0 refills | Status: DC

## 2020-06-30 MED ORDER — HYDROCOD POLST-CPM POLST ER 10-8 MG/5ML PO SUER: 5.0000 mL | Freq: Every evening | ORAL | 0 refills | Status: DC | PRN

## 2020-06-30 MED ORDER — LEVOFLOXACIN 750 MG PO TABS: 750.0000 mg | ORAL_TABLET | Freq: Every day | ORAL | 0 refills | Status: DC

## 2020-06-30 MED ORDER — BENZONATATE 200 MG PO CAPS: 200.0000 mg | ORAL_CAPSULE | Freq: Two times a day (BID) | ORAL | 0 refills | Status: DC | PRN

## 2020-06-30 MED ORDER — ALBUTEROL SULFATE HFA 108 (90 BASE) MCG/ACT IN AERS: 2.0000 | INHALATION_SPRAY | Freq: Four times a day (QID) | RESPIRATORY_TRACT | 11 refills | Status: DC | PRN

## 2020-06-30 NOTE — Telephone Encounter (Signed)
Note in my chart 

## 2020-06-30 NOTE — Telephone Encounter (Signed)
Pt saw Dr. Olivia Mackie today and she is needing a work note to be out until she gets her results back  Please fax letter to [phone]

## 2020-06-30 NOTE — Progress Notes (Signed)
Virtual Visit via Video Note  I connected with Melanie Fisher   on 06/30/20 at 11:30 AM EST by a video enabled telemedicine application and verified that I am speaking with the correct person using two identifiers.  Location patient: home, [place] Location provider:work or home office Persons participating in the virtual visit: patient, provider  I discussed the limitations of evaluation and management by telemedicine and the availability of in person appointments. The patient expressed understanding and agreed to proceed.   HPI: At home and 05/05/20 txed PCP URI augmentin bid x 7 days and tessalon perles she got better now worse with 06/14/20 sick and tested covid 06/24/20 results 06/28/20 negative as coworker was at work and covid + and she took mask down near this person to cough. Cough is worse tried mucinex DM Q4-6 hours and initially nyquil/dayquil before switching mucinex DM and cough is better. She felt back 3 days after shingrix and covid booster. Denies fever, sob, wheezing. She does have h/o asthma no albuterol inhaler. She was having lack of po intake, increased fatigue and sleep since not feeling well and sore throat. 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  Past Medical History:  Diagnosis Date  . Allergy   . Anxiety   . Asthma   . Depression   . GERD (gastroesophageal reflux disease)   . Heart murmur   . Hyperlipidemia   . Hypertension   . Sleep apnea   . Tobacco abuse     Past Surgical History:  Procedure Laterality Date  . ABDOMINAL HYSTERECTOMY  2008   partial  . COLONOSCOPY WITH PROPOFOL N/A 06/04/2018   Procedure: COLONOSCOPY WITH PROPOFOL;  Surgeon: Jonathon Bellows, MD;  Location: [facility] ENDOSCOPY;  Service: Gastroenterology;  Laterality: N/A;  . right toe bunionectomy    . right wrist ganglion cyst removal       Current Outpatient  Medications:  .  acyclovir (ZOVIRAX) 400 MG tablet, TAKE 1 TABLET BY MOUTH ONCE DAILY FOR  PREVENTION  OF  OUTBREAK, Disp: 90 tablet, Rfl: 0 .  albuterol (VENTOLIN HFA) 108 (90 Base) MCG/ACT inhaler, Inhale 2 puffs into the lungs every 6 (six) hours as needed for wheezing or shortness of breath., Disp: 18 g, Rfl: 11 .  amLODipine (NORVASC) 5 MG tablet, Take 1 tablet (5 mg total) by mouth daily., Disp: 90 tablet, Rfl: 1 .  benzonatate (TESSALON) 200 MG capsule, Take 1 capsule (200 mg total) by mouth 2 (two) times daily as needed for cough., Disp: 30 capsule, Rfl: 0 .  celecoxib (CELEBREX) 200 MG capsule, Take 1 capsule by mouth once daily, Disp: 90 capsule, Rfl: 0 .  chlorpheniramine-HYDROcodone (TUSSIONEX PENNKINETIC ER) 10-8 MG/5ML SUER, Take 5 mLs by mouth at bedtime as needed., Disp: 115 mL, Rfl: 0 .  citalopram (CELEXA) 20 MG tablet, Take 1 tablet (20 mg total) by mouth daily., Disp: 90 tablet, Rfl: 0 .  cyclobenzaprine (FLEXERIL) 5 MG tablet, Take 1 tablet by mouth three times daily as needed for muscle spasm, Disp: 90 tablet, Rfl: 0 .  isosorbide mononitrate (IMDUR) 30 MG 24 hr tablet, Take 1 tablet by mouth once daily, Disp: 90 tablet, Rfl: 0 .  levofloxacin (LEVAQUIN) 750 MG tablet, Take 1 tablet (750 mg total) by mouth daily. With food, Disp: 5 tablet, Rfl: 0 .  metoprolol succinate (TOPROL-XL) 50 MG 24  hr tablet, TAKE 1 TABLET BY MOUTH ONCE DAILY WITH  OR  IMMEDIATELY  FOLLOWING  A  MEAL, Disp: 90 tablet, Rfl: 0 .  omeprazole (PRILOSEC) 40 MG capsule, Take 1 capsule by mouth once daily, Disp: 90 capsule, Rfl: 0 .  orphenadrine (NORFLEX) 100 MG tablet, Take 1 tablet (100 mg total) by mouth 2 (two) times daily., Disp: 10 tablet, Rfl: 0 .  predniSONE (DELTASONE) 20 MG tablet, Take 2 tablets (40 mg total) by mouth daily with breakfast. X 7-10 days, Disp: 20 tablet, Rfl: 0  EXAM:  VITALS per patient if applicable:  GENERAL: alert, oriented, appears well and in no acute distress  HEENT:  atraumatic, conjunttiva clear, no obvious abnormalities on inspection of external nose and ears  NECK: normal movements of the head and neck  LUNGS: on inspection no signs of respiratory distress, breathing rate appears normal, no obvious gross SOB, gasping or wheezing +cough on exam   CV: no obvious cyanosis  MS: moves all visible extremities without noticeable abnormality  PSYCH/NEURO: pleasant and cooperative, no obvious depression or anxiety, speech and thought processing grossly intact  ASSESSMENT AND PLAN:  Discussed the following assessment and plan:  Exposure to COVID-19 virus - Plan: COVID-19, Flu A+B and RSV, DG Chest 2 View, chekl RSV, POCT rapid strep A  Cough could be asthma exacerbation vs bronchitis since having since 05/05/20 r/o covid - Plan:  levofloxacin (LEVAQUIN) 750 MG tablet qd x 5 days predniSONE (DELTASONE) 40 MG tablet x 7-10 days  benzonatate (TESSALON) 200 MG capsule tid prn, albuterol (VENTOLIN HFA) 108 (90 Base) MCG/ACT inhaler, chlorpheniramine-HYDROcodone (TUSSIONEX PENNKINETIC ER) 10-8 MG/5ML SUER, DG  Chest 2 View   -we discussed possible serious and likely etiologies, options for evaluation and workup, limitations of telemedicine visit vs in person visit, treatment, treatment risks and precautions.   I discussed the assessment and treatment plan with the patient. The patient was provided an opportunity to ask questions and all were answered. The patient agreed with the plan and demonstrated an understanding of the instructions.    Time spent 20 minutes  Delorise Jackson, MD

## 2020-06-30 NOTE — Progress Notes (Signed)
covidPatient presenting with cough, sweating, and sore throat. Sore throat has gotten better. Coughing up hard pieces, they are grey-ish with lumps in them.   Symptoms starting 28 th of December.  No one around the Patient is sick.. Patient took her booster on the 36 th along with her shingles shot.    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 Tested negative 1/6 for covid.

## 2020-07-01 NOTE — Telephone Encounter (Signed)
Sent to [employer]

## 2020-07-02 ENCOUNTER — Encounter: Payer: Self-pay | Admitting: Internal Medicine

## 2020-07-02 LAB — COVID-19, FLU A+B AND RSV
Influenza A, NAA: NOT DETECTED
Influenza B, NAA: NOT DETECTED
RSV, NAA: NOT DETECTED
SARS-CoV-2, NAA: NOT DETECTED

## 2020-07-28 ENCOUNTER — Other Ambulatory Visit: Payer: Self-pay | Admitting: Internal Medicine

## 2020-07-28 DIAGNOSIS — K219 Gastro-esophageal reflux disease without esophagitis: Secondary | ICD-10-CM

## 2020-07-28 DIAGNOSIS — M62830 Muscle spasm of back: Secondary | ICD-10-CM

## 2020-07-28 DIAGNOSIS — I10 Essential (primary) hypertension: Secondary | ICD-10-CM

## 2020-08-09 ENCOUNTER — Ambulatory Visit: Payer: BC Managed Care – PPO | Admitting: Internal Medicine

## 2020-08-09 DIAGNOSIS — Z0289 Encounter for other administrative examinations: Secondary | ICD-10-CM

## 2020-08-19 ENCOUNTER — Other Ambulatory Visit: Payer: Self-pay | Admitting: Internal Medicine

## 2020-08-19 DIAGNOSIS — Z720 Tobacco use: Secondary | ICD-10-CM

## 2020-08-19 MED ORDER — VARENICLINE TARTRATE 0.5 MG X 11 & 1 MG X 42 PO MISC: ORAL | 0 refills | Status: DC

## 2020-08-19 NOTE — Assessment & Plan Note (Addendum)
She resumed smoking over the winter and is requesting a repeat course of Chantix.  rx sent for the starting month pak

## 2020-10-04 ENCOUNTER — Emergency Department: Payer: BC Managed Care – PPO

## 2020-10-04 ENCOUNTER — Other Ambulatory Visit: Payer: Self-pay

## 2020-10-04 ENCOUNTER — Emergency Department
Admission: EM | Admit: 2020-10-04 | Discharge: 2020-10-04 | Disposition: A | Discharge status: Home / Self Care | Payer: BC Managed Care – PPO | Attending: Emergency Medicine | Admitting: Emergency Medicine

## 2020-10-04 DIAGNOSIS — R059 Cough, unspecified: Secondary | ICD-10-CM

## 2020-10-04 DIAGNOSIS — J45909 Unspecified asthma, uncomplicated: Secondary | ICD-10-CM | POA: Insufficient documentation

## 2020-10-04 DIAGNOSIS — J0140 Acute pansinusitis, unspecified: Secondary | ICD-10-CM | POA: Insufficient documentation

## 2020-10-04 DIAGNOSIS — Z79899 Other long term (current) drug therapy: Secondary | ICD-10-CM | POA: Insufficient documentation

## 2020-10-04 DIAGNOSIS — I10 Essential (primary) hypertension: Secondary | ICD-10-CM | POA: Insufficient documentation

## 2020-10-04 DIAGNOSIS — J441 Chronic obstructive pulmonary disease with (acute) exacerbation: Secondary | ICD-10-CM | POA: Insufficient documentation

## 2020-10-04 DIAGNOSIS — F17210 Nicotine dependence, cigarettes, uncomplicated: Secondary | ICD-10-CM | POA: Insufficient documentation

## 2020-10-04 MED ORDER — PREDNISONE 10 MG PO TABS: ORAL_TABLET | ORAL | 0 refills | Status: DC

## 2020-10-04 MED ORDER — DOXYCYCLINE HYCLATE 100 MG PO CAPS: 100.0000 mg | ORAL_CAPSULE | Freq: Two times a day (BID) | ORAL | 0 refills | Status: DC

## 2020-10-04 MED ORDER — GUAIFENESIN-CODEINE 100-10 MG/5ML PO SOLN: 5.0000 mL | Freq: Four times a day (QID) | ORAL | 0 refills | Status: DC | PRN

## 2020-10-04 NOTE — ED Provider Notes (Signed)
[facility] Emergency Department Provider Note   ____________________________________________   Event Date/Time   First MD Initiated Contact with Patient 10/04/20 [phone]     (approximate)  I have reviewed the triage vital signs and the nursing notes.   HISTORY  Chief Complaint URI   HPI Melanie Fisher is a 55 y.o. female presents to the ED with URI symptoms.  Patient states that she has had sinus and nasal congestion for the last 3 weeks.  She reports chest congestion and has been taking over-the-counter medication without relief.  She is denies any fever, chills, nausea or vomiting.  She has been taking her regular allergy medication.  She rates her pain as 0/10.     Past Medical History:  Diagnosis Date  . Allergy   . Anxiety   . Asthma   . Depression   . GERD (gastroesophageal reflux disease)   . Heart murmur   . Hyperlipidemia   . Hypertension   . Sleep apnea   . Tobacco abuse     Patient Active Problem List   Diagnosis Date Noted  . Herpes simplex infection 05/28/2019  . Acute midline thoracic back pain 04/01/2019  . Candida vaginitis 05/22/2018  . Widowed 05/21/2018  . Tubular adenoma of colon 05/21/2018  . B12 deficiency 05/21/2018  . Sinusitis, acute 03/07/2017  . COPD exacerbation ([place]) 12/15/2016  . Insomnia due to anxiety and fear 04/10/2015  . Polyneuropathy 02/10/2015  . Cervical disc disorder with radiculopathy of cervical region 12/25/2014  . Essential hypertension 08/19/2014  . Rib pain on left side 03/23/2014  . Degenerative disc disease, thoracic 03/23/2014  . Nonallopathic lesion of thoracic region 03/23/2014  . Nonallopathic lesion-rib cage 03/23/2014  . Personal history of colonic polyps 02/11/2014  . Obesity 07/01/2013  . History of tobacco abuse 03/15/2013  . Routine general medical examination at a health care facility 09/04/2012  . COPD (chronic obstructive pulmonary disease) ([place]) 05/26/2012  . Chronic  shoulder pain 04/09/2012  . Generalized anxiety disorder 04/06/2012  . Tobacco abuse 04/06/2012  . Depression with anxiety   . Heart murmur   . Hyperlipidemia   . Allergy     Past Surgical History:  Procedure Laterality Date  . ABDOMINAL HYSTERECTOMY  2008   partial  . COLONOSCOPY WITH PROPOFOL N/A 06/04/2018   Procedure: COLONOSCOPY WITH PROPOFOL;  Surgeon: Jonathon Bellows, MD;  Location: [facility] ENDOSCOPY;  Service: Gastroenterology;  Laterality: N/A;  . right toe bunionectomy    . right wrist ganglion cyst removal      Prior to Admission medications   Medication Sig Start Date End Date Taking? Authorizing Provider  doxycycline (VIBRAMYCIN) 100 MG capsule Take 1 capsule (100 mg total) by mouth 2 (two) times daily. 10/04/20  Yes Letitia Neri L, PA-C  guaiFENesin-codeine 100-10 MG/5ML syrup Take 5 mLs by mouth every 6 (six) hours as needed for cough. 10/04/20  Yes Johnn Hai, PA-C  predniSONE (DELTASONE) 10 MG tablet Take 6 tablets  today, on day 2 take 5 tablets, day 3 take 4 tablets, day 4 take 3 tablets, day 5 take  2 tablets and 1 tablet the last day 10/04/20  Yes Madalyn Rob, Jonika Critz L, PA-C  acyclovir (ZOVIRAX) 400 MG tablet TAKE 1 TABLET BY MOUTH ONCE DAILY FOR  PREVENTION  OF  OUTBREAK 07/28/20   Crecencio Mc, MD  albuterol (VENTOLIN HFA) 108 (90 Base) MCG/ACT inhaler Inhale 2 puffs into the lungs every 6 (six) hours as needed for wheezing or  shortness of breath. 06/30/20   McLean-Scocuzza, Nino Glow, MD  amLODipine (NORVASC) 5 MG tablet Take 1 tablet (5 mg total) by mouth daily. 02/06/20   Crecencio Mc, MD  celecoxib (CELEBREX) 200 MG capsule Take 1 capsule by mouth once daily 07/28/20   Crecencio Mc, MD  citalopram (CELEXA) 20 MG tablet Take 1 tablet by mouth once daily 07/28/20   Crecencio Mc, MD  isosorbide mononitrate (IMDUR) 30 MG 24 hr tablet Take 1 tablet by mouth once daily 06/25/20   Crecencio Mc, MD  metoprolol succinate (TOPROL-XL) 50 MG 24 hr tablet TAKE 1 TABLET BY  MOUTH ONCE DAILY WITH  OR  IMMEDIATELY  FOLLOWING  A  MEAL 07/28/20   Crecencio Mc, MD  omeprazole (PRILOSEC) 40 MG capsule Take 1 capsule by mouth once daily 07/28/20   Crecencio Mc, MD  varenicline (CHANTIX PAK) 0.5 MG X 11 & 1 MG X 42 tablet Take one 0.5 mg tablet by mouth once daily for 3 days, then increase to one 0.5 mg tablet twice daily for 4 days, then increase to one 1 mg tablet twice daily. 08/19/20   Crecencio Mc, MD    Allergies Patient has no known allergies.  Family History  Problem Relation Age of Onset  . Kidney disease Mother 3       ESKD, partial neprhectomy  . COPD Mother   . Heart disease Father        CABG at age 45  . Alcohol abuse Father        until late 58  . Diabetes Father   . Hypertension Father   . Diabetes Sister   . Cancer Sister        uterine ca  . Cancer Maternal Uncle 60       stage 4 colon CA  . Colon cancer Maternal Uncle   . Esophageal cancer Neg Hx   . Pancreatic cancer Neg Hx   . Rectal cancer Neg Hx   . Stomach cancer Neg Hx   . Breast cancer Neg Hx     Social History Social History   Tobacco Use  . Smoking status: Current Every Day Smoker    Packs/day: 0.50    Years: 21.00    Pack years: 10.50    Types: Cigarettes  . Smokeless tobacco: Never Used  Vaping Use  . Vaping Use: Never used  Substance Use Topics  . Alcohol use: Not Currently    Comment: quit drinking   . Drug use: No    Review of Systems Constitutional: No fever/chills Eyes: No visual changes. ENT: Positive for congestion. Cardiovascular: Denies chest pain. Respiratory: Denies shortness of breath.  Positive for cough. Gastrointestinal: No abdominal pain.  No nausea, no vomiting.  Genitourinary: Negative for dysuria.   Musculoskeletal: Negative for back pain. Skin: Negative for rash. Neurological: Negative for headaches, focal weakness or numbness. ____________________________________________   PHYSICAL EXAM:  VITAL SIGNS: ED Triage Vitals  Enc  Vitals Group     BP 10/04/20 0941 (!) 170/87     Pulse Rate 10/04/20 0941 71     Resp 10/04/20 0941 18     Temp 10/04/20 0941 98.6 F (37 C)     Temp Source 10/04/20 0941 Oral     SpO2 10/04/20 0941 98 %     Weight 10/04/20 0942 195 lb (88.5 kg)     Height 10/04/20 0942 5\' 3"  (1.6 m)     Head Circumference --  Peak Flow --      Pain Score 10/04/20 0942 0     Pain Loc --      Pain Edu? --      Excl. in [place]? --     Constitutional: Alert and oriented. Well appearing and in no acute distress. Eyes: Conjunctivae are normal. PERRL. EOMI. Head: Atraumatic. Nose: No congestion/rhinnorhea.  Tenderness left frontal and bilateral maxillary sinuses to percussion. Mouth/Throat: Mucous membranes are moist.  Oropharynx non-erythematous. Neck: No stridor.   Hematological/Lymphatic/Immunilogical: No cervical lymphadenopathy. Cardiovascular: Normal rate, regular rhythm. Grossly normal heart sounds.  Good peripheral circulation. Respiratory: Normal respiratory effort.  No retractions. Lungs CTAB. Gastrointestinal: Soft and nontender. No distention. No abdominal bruits. No CVA tenderness. Musculoskeletal: Moves upper and lower extremities no difficulty and patient is ambulatory without any assistance. Neurologic:  Normal speech and language. No gross focal neurologic deficits are appreciated. No gait instability. Skin:  Skin is warm, dry and intact. No rash noted. Psychiatric: Mood and affect are normal. Speech and behavior are normal.  ____________________________________________   LABS (all labs ordered are listed, but only abnormal results are displayed)  Labs Reviewed - No data to display ____________________________________________   RADIOLOGY I, Johnn Hai, personally viewed and evaluated these images (plain radiographs) as part of my medical decision making, as well as reviewing the written report by the radiologist.  Official radiology report(s): DG Chest 2 View  Result  Date: 10/04/2020 CLINICAL DATA:  Cough for 10 days EXAM: CHEST - 2 VIEW COMPARISON:  06/30/2020 FINDINGS: Normal heart size and mediastinal contours. No acute infiltrate or edema. No effusion or pneumothorax. No acute osseous findings. IMPRESSION: Negative chest. Electronically Signed   By: Monte Fantasia M.D.   On: 10/04/2020 10:33    ____________________________________________   PROCEDURES  Procedure(s) performed (including Critical Care):  Procedures   ____________________________________________   INITIAL IMPRESSION / ASSESSMENT AND PLAN / ED COURSE  As part of my medical decision making, I reviewed the following data within the electronic MEDICAL RECORD NUMBER Notes from prior ED visits and [place] Controlled Substance Database  55 year old female presents to the ED with complaint of congestion and sinus pressure for approximately 3 weeks.  Patient was unaware of any fever and has been taking over-the-counter medications without any relief.  Patient was tender on percussion of her sinus areas x3 and had a congested cough.  Chest x-ray was negative for any acute changes.  Patient is to continue taking her routine allergy medication.  She was discharged with prescription for prednisone tapering dose, doxycycline 100 mg twice daily for 10 days and Robitussin-AC as needed for cough.  She is encouraged to increase fluids.  She is to follow-up with her PCP or urgent care if any continued problems.  ____________________________________________   FINAL CLINICAL IMPRESSION(S) / ED DIAGNOSES  Final diagnoses:  Acute pansinusitis, recurrence not specified  Cough     ED Discharge Orders         Ordered    guaiFENesin-codeine 100-10 MG/5ML syrup  Every 6 hours PRN        10/04/20 1044    doxycycline (VIBRAMYCIN) 100 MG capsule  2 times daily        10/04/20 1044    predniSONE (DELTASONE) 10 MG tablet        10/04/20 1044          *Please note:  Melanie Fisher was evaluated in Emergency  Department on 10/04/2020 for the symptoms described in the history of present  illness. She was evaluated in the context of the global COVID-19 pandemic, which necessitated consideration that the patient might be at risk for infection with the SARS-CoV-2 virus that causes COVID-19. Institutional protocols and algorithms that pertain to the evaluation of patients at risk for COVID-19 are in a state of rapid change based on information released by regulatory bodies including the CDC and federal and state organizations. These policies and algorithms were followed during the patient's care in the ED.  Some ED evaluations and interventions may be delayed as a result of limited staffing during and the pandemic.*   Note:  This document was prepared using Dragon voice recognition software and may include unintentional dictation errors.    Johnn Hai, PA-C 10/04/20 1304    Lavonia Drafts, MD 10/04/20 1309

## 2020-10-04 NOTE — ED Notes (Signed)
See triage note  Presents with URI sxs'  States it started out with sharp pain to both ears and then she developed nasal congestion  Now has cough  No fever

## 2020-10-04 NOTE — ED Triage Notes (Signed)
Pt c/o cough with sinus and chest congestion for the past 3 weeks, states she has been taking OTC meds with no relief.

## 2020-10-04 NOTE — Discharge Instructions (Signed)
.  Follow-up with your primary care provider if any continued problems or concerns.  Begin taking medication as prescribed doxycycline is twice a day for the next 10 days, the prednisone is to be started today and is a tapered dose tapering down by 1 tablet each day until completely finished.  The guaifenesin with codeine is only as needed for coughing.  Be aware that the medication contains a narcotic in the form of codeine and could cause drowsiness.  Increase fluids.  Decrease or discontinue smoking.  Also continue your Allegra-D for allergy symptoms.

## 2020-10-08 ENCOUNTER — Other Ambulatory Visit: Payer: Self-pay | Admitting: Internal Medicine

## 2020-10-11 ENCOUNTER — Other Ambulatory Visit: Payer: Self-pay | Admitting: Internal Medicine

## 2020-10-11 ENCOUNTER — Other Ambulatory Visit: Payer: Self-pay

## 2020-10-12 ENCOUNTER — Other Ambulatory Visit: Payer: Self-pay | Admitting: Internal Medicine

## 2020-10-12 MED ORDER — ISOSORBIDE MONONITRATE ER 30 MG PO TB24: 30.0000 mg | ORAL_TABLET | Freq: Every day | ORAL | 0 refills | Status: DC

## 2020-10-18 ENCOUNTER — Other Ambulatory Visit: Payer: Self-pay

## 2020-10-18 MED ORDER — ISOSORBIDE MONONITRATE ER 30 MG PO TB24: 30.0000 mg | ORAL_TABLET | Freq: Every day | ORAL | 2 refills | Status: DC

## 2020-11-01 ENCOUNTER — Emergency Department
Admission: EM | Admit: 2020-11-01 | Discharge: 2020-11-01 | Disposition: A | Discharge status: Home / Self Care | Payer: 59 | Attending: Emergency Medicine | Admitting: Emergency Medicine

## 2020-11-01 ENCOUNTER — Other Ambulatory Visit: Payer: Self-pay

## 2020-11-01 ENCOUNTER — Emergency Department: Payer: 59

## 2020-11-01 DIAGNOSIS — G589 Mononeuropathy, unspecified: Secondary | ICD-10-CM

## 2020-11-01 DIAGNOSIS — Z79899 Other long term (current) drug therapy: Secondary | ICD-10-CM | POA: Insufficient documentation

## 2020-11-01 DIAGNOSIS — J45909 Unspecified asthma, uncomplicated: Secondary | ICD-10-CM | POA: Diagnosis not present

## 2020-11-01 DIAGNOSIS — J441 Chronic obstructive pulmonary disease with (acute) exacerbation: Secondary | ICD-10-CM | POA: Insufficient documentation

## 2020-11-01 DIAGNOSIS — F17210 Nicotine dependence, cigarettes, uncomplicated: Secondary | ICD-10-CM | POA: Diagnosis not present

## 2020-11-01 DIAGNOSIS — M5412 Radiculopathy, cervical region: Secondary | ICD-10-CM | POA: Insufficient documentation

## 2020-11-01 DIAGNOSIS — I10 Essential (primary) hypertension: Secondary | ICD-10-CM | POA: Diagnosis not present

## 2020-11-01 DIAGNOSIS — R69 Illness, unspecified: Secondary | ICD-10-CM | POA: Diagnosis not present

## 2020-11-01 DIAGNOSIS — R202 Paresthesia of skin: Secondary | ICD-10-CM | POA: Diagnosis present

## 2020-11-01 MED ORDER — METHYLPREDNISOLONE 4 MG PO TBPK: ORAL_TABLET | ORAL | 0 refills | Status: DC

## 2020-11-01 MED ORDER — TRAMADOL HCL 50 MG PO TABS: 50.0000 mg | ORAL_TABLET | Freq: Four times a day (QID) | ORAL | 0 refills | Status: DC | PRN

## 2020-11-01 NOTE — Discharge Instructions (Signed)
No acute findings on CT of the head and neck.  Read and follow discharge care instruction.  Take medication as directed.

## 2020-11-01 NOTE — ED Notes (Signed)
See triage note  States she developed right jaw pain which went into right arm 1-2 days ago  Now having neck pain and h/a  Denies any fever,trauma

## 2020-11-01 NOTE — ED Triage Notes (Signed)
Pt c/o burning tingling pain from the neck down into the right arm today. Denies injury

## 2020-11-01 NOTE — ED Provider Notes (Signed)
[facility] Emergency Department Provider Note   ____________________________________________   Event Date/Time   First MD Initiated Contact with Patient 11/01/20 1019     (approximate)  I have reviewed the triage vital signs and the nursing notes.   HISTORY  Chief Complaint Neck Pain   HPI Melanie Fisher is a 55 y.o. female patient complaining of "burning/tingling" pain radiating from the neck to the right upper extremity.  Patient also states this was preceded by headache and blurry vision.  Patient states headache and blurry vision resolved after arriving to ED.  Patient has a history of degenerative changes to the cervical spine with disc bulging.  Patient rates her pain as a 5/10.  No palliative measure prior to arrival.         Past Medical History:  Diagnosis Date  . Allergy   . Anxiety   . Asthma   . Depression   . GERD (gastroesophageal reflux disease)   . Heart murmur   . Hyperlipidemia   . Hypertension   . Sleep apnea   . Tobacco abuse     Patient Active Problem List   Diagnosis Date Noted  . Herpes simplex infection 05/28/2019  . Acute midline thoracic back pain 04/01/2019  . Candida vaginitis 05/22/2018  . Widowed 05/21/2018  . Tubular adenoma of colon 05/21/2018  . B12 deficiency 05/21/2018  . Sinusitis, acute 03/07/2017  . COPD exacerbation ([place]) 12/15/2016  . Insomnia due to anxiety and fear 04/10/2015  . Polyneuropathy 02/10/2015  . Cervical disc disorder with radiculopathy of cervical region 12/25/2014  . Essential hypertension 08/19/2014  . Rib pain on left side 03/23/2014  . Degenerative disc disease, thoracic 03/23/2014  . Nonallopathic lesion of thoracic region 03/23/2014  . Nonallopathic lesion-rib cage 03/23/2014  . Personal history of colonic polyps 02/11/2014  . Obesity 07/01/2013  . History of tobacco abuse 03/15/2013  . Routine general medical examination at a health care facility 09/04/2012  . COPD  (chronic obstructive pulmonary disease) ([place]) 05/26/2012  . Chronic shoulder pain 04/09/2012  . Generalized anxiety disorder 04/06/2012  . Tobacco abuse 04/06/2012  . Depression with anxiety   . Heart murmur   . Hyperlipidemia   . Allergy     Past Surgical History:  Procedure Laterality Date  . ABDOMINAL HYSTERECTOMY  2008   partial  . COLONOSCOPY WITH PROPOFOL N/A 06/04/2018   Procedure: COLONOSCOPY WITH PROPOFOL;  Surgeon: Jonathon Bellows, MD;  Location: [facility] ENDOSCOPY;  Service: Gastroenterology;  Laterality: N/A;  . right toe bunionectomy    . right wrist ganglion cyst removal      Prior to Admission medications   Medication Sig Start Date End Date Taking? Authorizing Provider  methylPREDNISolone (MEDROL DOSEPAK) 4 MG TBPK tablet Take Tapered dose as directed 11/01/20  Yes Sable Feil, PA-C  traMADol (ULTRAM) 50 MG tablet Take 1 tablet (50 mg total) by mouth every 6 (six) hours as needed for moderate pain. 11/01/20  Yes Sable Feil, PA-C  acyclovir (ZOVIRAX) 400 MG tablet TAKE 1 TABLET BY MOUTH ONCE DAILY FOR  PREVENTION  OF  OUTBREAK 07/28/20   Crecencio Mc, MD  albuterol (VENTOLIN HFA) 108 (90 Base) MCG/ACT inhaler Inhale 2 puffs into the lungs every 6 (six) hours as needed for wheezing or shortness of breath. 06/30/20   McLean-Scocuzza, Nino Glow, MD  amLODipine (NORVASC) 5 MG tablet Take 1 tablet (5 mg total) by mouth daily. 02/06/20   Crecencio Mc, MD  celecoxib (CELEBREX)  200 MG capsule Take 1 capsule by mouth once daily 07/28/20   Crecencio Mc, MD  citalopram (CELEXA) 20 MG tablet Take 1 tablet by mouth once daily 07/28/20   Crecencio Mc, MD  isosorbide mononitrate (IMDUR) 30 MG 24 hr tablet Take 1 tablet (30 mg total) by mouth daily. 10/18/20   Crecencio Mc, MD  metoprolol succinate (TOPROL-XL) 50 MG 24 hr tablet TAKE 1 TABLET BY MOUTH ONCE DAILY WITH  OR  IMMEDIATELY  FOLLOWING  A  MEAL 07/28/20   Crecencio Mc, MD  omeprazole (PRILOSEC) 40 MG capsule Take 1 capsule  by mouth once daily 07/28/20   Crecencio Mc, MD  varenicline (CHANTIX PAK) 0.5 MG X 11 & 1 MG X 42 tablet Take one 0.5 mg tablet by mouth once daily for 3 days, then increase to one 0.5 mg tablet twice daily for 4 days, then increase to one 1 mg tablet twice daily. 08/19/20   Crecencio Mc, MD    Allergies Patient has no known allergies.  Family History  Problem Relation Age of Onset  . Kidney disease Mother 10       ESKD, partial neprhectomy  . COPD Mother   . Heart disease Father        CABG at age 17  . Alcohol abuse Father        until late 21  . Diabetes Father   . Hypertension Father   . Diabetes Sister   . Cancer Sister        uterine ca  . Cancer Maternal Uncle 60       stage 4 colon CA  . Colon cancer Maternal Uncle   . Esophageal cancer Neg Hx   . Pancreatic cancer Neg Hx   . Rectal cancer Neg Hx   . Stomach cancer Neg Hx   . Breast cancer Neg Hx     Social History Social History   Tobacco Use  . Smoking status: Current Every Day Smoker    Packs/day: 0.50    Years: 21.00    Pack years: 10.50    Types: Cigarettes  . Smokeless tobacco: Never Used  Vaping Use  . Vaping Use: Never used  Substance Use Topics  . Alcohol use: Not Currently    Comment: quit drinking   . Drug use: No    Review of Systems Constitutional: No fever/chills Eyes: No visual changes. ENT: No sore throat. Cardiovascular: Denies chest pain. Respiratory: Denies shortness of breath. Gastrointestinal: No abdominal pain.  No nausea, no vomiting.  No diarrhea.  No constipation. Genitourinary: Negative for dysuria. Musculoskeletal: Neck and right arm pain. Skin: Negative for rash. Neurological: Positive for headaches, with numbness to the right upper extremity.[name] Psychiatric:  Anxiety and depression. Endocrine:  Hyperlipidemia and hypertension. ____________________________________________   PHYSICAL EXAM:  VITAL SIGNS: ED Triage Vitals [11/01/20 0937]  Enc Vitals Group     BP  (!) 144/94     Pulse Rate 65     Resp 16     Temp 97.7 F (36.5 C)     Temp Source Oral     SpO2 98 %     Weight 190 lb (86.2 kg)     Height 5\' 3"  (1.6 m)     Head Circumference      Peak Flow      Pain Score 5     Pain Loc      Pain Edu?      Excl. in [place]?  Constitutional: Alert and oriented. Well appearing and in no acute distress. Eyes: Conjunctivae are normal. PERRL. EOMI. Head: Atraumatic. Nose: No congestion/rhinnorhea. Mouth/Throat: Mucous membranes are moist.  Oropharynx non-erythematous. Neck: No stridor.   cervical spine tenderness to palpation C4-C6. Hematological/Lymphatic/Immunilogical: No cervical lymphadenopathy. Cardiovascular: Normal rate, regular rhythm. Grossly normal heart sounds.  Good peripheral circulation. Respiratory: Normal respiratory effort.  No retractions. Lungs CTAB. Musculoskeletal: No lower extremity tenderness nor edema.  No joint effusions. Neurologic:  Normal speech and language. No gross focal neurologic deficits are appreciated. No gait instability. Skin:  Skin is warm, dry and intact. No rash noted. Psychiatric: Mood and affect are normal. Speech and behavior are normal.  ____________________________________________   LABS (all labs ordered are listed, but only abnormal results are displayed)  Labs Reviewed - No data to display ____________________________________________  EKG   ____________________________________________  RADIOLOGY I, Sable Feil, personally viewed and evaluated these images (plain radiographs) as part of my medical decision making, as well as reviewing the written report by the radiologist.  ED MD interpretation:    Official radiology report(s): CT Head Wo Contrast  Result Date: 11/01/2020 CLINICAL DATA:  Right jaw pain, arm pain EXAM: CT HEAD WITHOUT CONTRAST TECHNIQUE: Contiguous axial images were obtained from the base of the skull through the vertex without intravenous contrast. COMPARISON:  None.  FINDINGS: Brain: No acute intracranial abnormality. Specifically, no hemorrhage, hydrocephalus, mass lesion, acute infarction, or significant intracranial injury. Vascular: No hyperdense vessel or unexpected calcification. Skull: No acute calvarial abnormality. Sinuses/Orbits: Visualized paranasal sinuses and mastoids clear. Orbital soft tissues unremarkable. Other: None IMPRESSION: No acute intracranial abnormality. Electronically Signed   By: Rolm Baptise M.D.   On: 11/01/2020 11:08   CT Cervical Spine Wo Contrast  Result Date: 11/01/2020 CLINICAL DATA:  Chronic neck pain, right arm pain EXAM: CT CERVICAL SPINE WITHOUT CONTRAST TECHNIQUE: Multidetector CT imaging of the cervical spine was performed without intravenous contrast. Multiplanar CT image reconstructions were also generated. COMPARISON:  None. FINDINGS: Alignment: Normal Skull base and vertebrae: No acute fracture. No primary bone lesion or focal pathologic process. Soft tissues and spinal canal: No prevertebral fluid or swelling. No visible canal hematoma. Disc levels: Disc spaces maintained. No neural foraminal narrowing. Upper chest: No acute findings Other: None IMPRESSION: No acute bony abnormality. Electronically Signed   By: Rolm Baptise M.D.   On: 11/01/2020 11:09    ____________________________________________   PROCEDURES  Procedure(s) performed (including Critical Care):  Procedures   ____________________________________________   INITIAL IMPRESSION / ASSESSMENT AND PLAN / ED COURSE  As part of my medical decision making, I reviewed the following data within the [place]         Patient complains of burning/stinging sensation from the neck to the right upper arm.  Complaint was preceded by headache and blurry vision which resolved after arriving to the ED.  Differential consist intracranial hemorrhaging or nerve compression cervical spine.  No provoking incident for complaint.  Discussed no acute  findings on CT of the head and neck.  Patient complaining physical exam consistent with radiculopathy.  Patient given discharge care instruction advised take medication as directed.  Patient vies follow-up PCP if no improvement in 3 to 5 days.  Return to ED if condition worsens.      ____________________________________________   FINAL CLINICAL IMPRESSION(S) / ED DIAGNOSES  Final diagnoses:  Pinched nerve in neck     ED Discharge Orders         Ordered  methylPREDNISolone (MEDROL DOSEPAK) 4 MG TBPK tablet        11/01/20 1134    traMADol (ULTRAM) 50 MG tablet  Every 6 hours PRN        11/01/20 1134          *Please note:  DELANIE TIRRELL was evaluated in Emergency Department on 11/01/2020 for the symptoms described in the history of present illness. She was evaluated in the context of the global COVID-19 pandemic, which necessitated consideration that the patient might be at risk for infection with the SARS-CoV-2 virus that causes COVID-19. Institutional protocols and algorithms that pertain to the evaluation of patients at risk for COVID-19 are in a state of rapid change based on information released by regulatory bodies including the CDC and federal and state organizations. These policies and algorithms were followed during the patient's care in the ED.  Some ED evaluations and interventions may be delayed as a result of limited staffing during and the pandemic.*   Note:  This document was prepared using Dragon voice recognition software and may include unintentional dictation errors.    Sable Feil, PA-C 11/01/20 1137    Arta Silence, MD 11/01/20 1226

## 2020-11-08 ENCOUNTER — Telehealth: Payer: Self-pay

## 2020-11-08 DIAGNOSIS — J40 Bronchitis, not specified as acute or chronic: Secondary | ICD-10-CM

## 2020-11-08 DIAGNOSIS — K219 Gastro-esophageal reflux disease without esophagitis: Secondary | ICD-10-CM

## 2020-11-08 DIAGNOSIS — I10 Essential (primary) hypertension: Secondary | ICD-10-CM

## 2020-11-08 DIAGNOSIS — J4521 Mild intermittent asthma with (acute) exacerbation: Secondary | ICD-10-CM

## 2020-11-08 NOTE — Telephone Encounter (Signed)
Pt needs to change her pharmacy to [facility] in [place] and she also needs refills on all of her medications. Please send

## 2020-11-09 MED ORDER — METOPROLOL SUCCINATE ER 50 MG PO TB24: ORAL_TABLET | ORAL | 0 refills | Status: DC

## 2020-11-09 MED ORDER — CELECOXIB 200 MG PO CAPS
200.0000 mg | ORAL_CAPSULE | Freq: Every day | ORAL | 0 refills | Status: DC
Start: 2020-11-09 — End: 2021-02-04

## 2020-11-09 MED ORDER — ACYCLOVIR 400 MG PO TABS: ORAL_TABLET | ORAL | 0 refills | Status: DC

## 2020-11-09 MED ORDER — OMEPRAZOLE 40 MG PO CPDR: 1.0000 | DELAYED_RELEASE_CAPSULE | Freq: Every day | ORAL | 0 refills | Status: DC

## 2020-11-09 MED ORDER — AMLODIPINE BESYLATE 5 MG PO TABS: 5.0000 mg | ORAL_TABLET | Freq: Every day | ORAL | 1 refills | Status: DC

## 2020-11-09 MED ORDER — ALBUTEROL SULFATE HFA 108 (90 BASE) MCG/ACT IN AERS
2.0000 | INHALATION_SPRAY | Freq: Four times a day (QID) | RESPIRATORY_TRACT | 11 refills | Status: DC | PRN
Start: 2020-11-09 — End: 2021-04-06

## 2020-11-09 MED ORDER — CITALOPRAM HYDROBROMIDE 20 MG PO TABS: 20.0000 mg | ORAL_TABLET | Freq: Every day | ORAL | 0 refills | Status: DC

## 2020-11-09 MED ORDER — ISOSORBIDE MONONITRATE ER 30 MG PO TB24: 30.0000 mg | ORAL_TABLET | Freq: Every day | ORAL | 2 refills | Status: DC

## 2020-12-22 ENCOUNTER — Encounter: Payer: Self-pay | Admitting: Internal Medicine

## 2020-12-22 ENCOUNTER — Ambulatory Visit: Payer: 59 | Admitting: Internal Medicine

## 2020-12-22 ENCOUNTER — Other Ambulatory Visit: Payer: Self-pay

## 2020-12-22 VITALS — BP 130/76 | HR 69 | Temp 96.1°F | Resp 14 | Ht 63.0 in | Wt 202.2 lb

## 2020-12-22 DIAGNOSIS — L02415 Cutaneous abscess of right lower limb: Secondary | ICD-10-CM | POA: Diagnosis not present

## 2020-12-22 DIAGNOSIS — L03115 Cellulitis of right lower limb: Secondary | ICD-10-CM

## 2020-12-22 DIAGNOSIS — R7303 Prediabetes: Secondary | ICD-10-CM | POA: Diagnosis not present

## 2020-12-22 DIAGNOSIS — E6609 Other obesity due to excess calories: Secondary | ICD-10-CM | POA: Diagnosis not present

## 2020-12-22 DIAGNOSIS — Z720 Tobacco use: Secondary | ICD-10-CM

## 2020-12-22 DIAGNOSIS — J439 Emphysema, unspecified: Secondary | ICD-10-CM

## 2020-12-22 DIAGNOSIS — M79671 Pain in right foot: Secondary | ICD-10-CM | POA: Diagnosis not present

## 2020-12-22 DIAGNOSIS — Z6834 Body mass index (BMI) 34.0-34.9, adult: Secondary | ICD-10-CM

## 2020-12-22 DIAGNOSIS — R635 Abnormal weight gain: Secondary | ICD-10-CM

## 2020-12-22 MED ORDER — TRAMADOL HCL 50 MG PO TABS: 50.0000 mg | ORAL_TABLET | Freq: Two times a day (BID) | ORAL | 2 refills | Status: DC | PRN

## 2020-12-22 MED ORDER — MELOXICAM 15 MG PO TABS: 15.0000 mg | ORAL_TABLET | Freq: Every day | ORAL | 0 refills | Status: DC

## 2020-12-22 MED ORDER — DOXYCYCLINE HYCLATE 100 MG PO TABS: 100.0000 mg | ORAL_TABLET | Freq: Two times a day (BID) | ORAL | 0 refills | Status: DC

## 2020-12-22 NOTE — Patient Instructions (Addendum)
I will refill the tramadol for nighttime use (2 daily/max dose)  I will send in a substitute for the celebrex  (meloxicam)    You can add up to 2000 mg of acetominophen (tylenol) every day safely  In divided doses (500 mg every 6 hours  Or 1000 mg every 12 hours.) this can be taken with  the meloxicam and tramadol    I am treating you for cellulitis from your insect bites with doxycycline  Daily use of a probiotic advised for 3 weeks.

## 2020-12-22 NOTE — Progress Notes (Addendum)
Subjective:  Patient ID: Melanie Fisher, female    DOB: [birth year]  Age: 55 y.o. MRN: [id number]  CC: The primary encounter diagnosis was Intractable right heel pain. Diagnoses of Weight gain, Pulmonary emphysema, unspecified emphysema type ([place]), Class 1 obesity due to excess calories without serious comorbidity with body mass index (BMI) of 34.0 to 34.9 in adult, Prediabetes, Tobacco abuse, and Cellulitis and abscess of right lower extremity were also pertinent to this visit.  HPI Melanie Fisher presents for evaluation and treatment  of right foot pain   This visit occurred during the SARS-CoV-2 public health emergency.  Safety protocols were in place, including screening questions prior to the visit, additional usage of staff PPE, and extensive cleaning of exam room while observing appropriate contact time as indicated for disinfecting solutions.    Foot pain:  she reports pain in the right heel that is aggravated by weight bearing.   It started in March.  No histoty of trauma,  bruising . Tried the PF sleeves,  stopped working. The pain becomes more severe  as the day progresses when she is on her feet frequently    Several pustules  around her  right ankle noticed today,  was stung by some insect several times last Thursday while mowing the grass. Paid no attention to it.   Tobacco abuse quit march 2022   after mother and friend died   Request for tramadol refill:  reviewed recent pain issues  (ER May 16 neck pain,  started with with left frontal headache,  right sided neck pain that radiated to arm  CT head and c spine no acute changes   took tramadol   Quit working in the school system  now at [employer] and  loves her job   Outpatient Medications Prior to Visit  Medication Sig Dispense Refill   acyclovir (ZOVIRAX) 400 MG tablet TAKE 1 TABLET BY MOUTH ONCE DAILY FOR  PREVENTION  OF  OUTBREAK 90 tablet 0   albuterol (VENTOLIN HFA) 108 (90 Base) MCG/ACT inhaler Inhale 2 puffs  into the lungs every 6 (six) hours as needed for wheezing or shortness of breath. 18 g 11   amLODipine (NORVASC) 5 MG tablet Take 1 tablet (5 mg total) by mouth daily. 90 tablet 1   celecoxib (CELEBREX) 200 MG capsule Take 1 capsule (200 mg total) by mouth daily. 90 capsule 0   citalopram (CELEXA) 20 MG tablet Take 1 tablet (20 mg total) by mouth daily. 90 tablet 0   isosorbide mononitrate (IMDUR) 30 MG 24 hr tablet Take 1 tablet (30 mg total) by mouth daily. 30 tablet 2   metoprolol succinate (TOPROL-XL) 50 MG 24 hr tablet TAKE 1 TABLET BY MOUTH ONCE DAILY WITH  OR  IMMEDIATELY  FOLLOWING  A  MEAL 90 tablet 0   omeprazole (PRILOSEC) 40 MG capsule Take 1 capsule (40 mg total) by mouth daily. 90 capsule 0   traMADol (ULTRAM) 50 MG tablet Take 1 tablet (50 mg total) by mouth every 6 (six) hours as needed for moderate pain. 12 tablet 0   methylPREDNISolone (MEDROL DOSEPAK) 4 MG TBPK tablet Take Tapered dose as directed (Patient not taking: Reported on 12/22/2020) 21 tablet 0   varenicline (CHANTIX PAK) 0.5 MG X 11 & 1 MG X 42 tablet Take one 0.5 mg tablet by mouth once daily for 3 days, then increase to one 0.5 mg tablet twice daily for 4 days, then increase to one 1 mg tablet twice  daily. 53 tablet 0   No facility-administered medications prior to visit.    Review of Systems;  Patient denies headache, fevers, malaise, unintentional weight loss, skin rash, eye pain, sinus congestion and sinus pain, sore throat, dysphagia,  hemoptysis , cough, dyspnea, wheezing, chest pain, palpitations, orthopnea, edema, abdominal pain, nausea, melena, diarrhea, constipation, flank pain, dysuria, hematuria, urinary  Frequency, nocturia, numbness, tingling, seizures,  Focal weakness, Loss of consciousness,  Tremor, insomnia, depression, anxiety, and suicidal ideation.      Objective:  BP 130/76 (BP Location: Left Arm, Patient Position: Sitting, Cuff Size: Normal)   Pulse 69   Temp (!) 96.1 F (35.6 C) (Temporal)    Resp 14   Ht 5\' 3"  (1.6 m)   Wt 202 lb 3.2 oz (91.7 kg)   SpO2 99%   BMI 35.82 kg/m   BP Readings from Last 3 Encounters:  12/22/20 130/76  11/01/20 (!) 144/94  10/04/20 (!) 170/87    Wt Readings from Last 3 Encounters:  12/22/20 202 lb 3.2 oz (91.7 kg)  11/01/20 190 lb (86.2 kg)  10/04/20 195 lb (88.5 kg)    General appearance: alert, cooperative and appears stated age Ears: normal TM's and external ear canals both ears Throat: lips, mucosa, and tongue normal; teeth and gums normal Neck: no adenopathy, no carotid bruit, supple, symmetrical, trachea midline and thyroid not enlarged, symmetric, no tenderness/mass/nodules Back: symmetric, no curvature. ROM normal. No CVA tenderness. Lungs: clear to auscultation bilaterally Heart: regular rate and rhythm, S1, S2 normal, no murmur, click, rub or gallop Abdomen: soft, non-tender; bowel sounds normal; no masses,  no organomegaly Pulses: 2+ and symmetric Skin: several pustules in an annular pattern around her right ankle. Skin color, texture, turgor normal. No rashes or lesions Lymph nodes: Cervical, supraclavicular, and axillary nodes normal.  Lab Results  Component Value Date   HGBA1C 6.0 12/22/2020   HGBA1C 5.2 02/21/2019   HGBA1C 5.8 05/20/2018    Lab Results  Component Value Date   CREATININE 0.86 12/22/2020   CREATININE 0.89 02/10/2020   CREATININE 0.94 02/21/2019    Lab Results  Component Value Date   WBC 4.8 04/01/2018   HGB 14.3 04/01/2018   HCT 41.6 04/01/2018   PLT 196 04/01/2018   GLUCOSE 81 12/22/2020   CHOL 237 (H) 12/22/2020   TRIG 148.0 12/22/2020   HDL 56.20 12/22/2020   LDLDIRECT 131.0 05/23/2016   LDLCALC 151 (H) 12/22/2020   ALT 8 12/22/2020   AST 11 12/22/2020   NA 139 12/22/2020   K 4.5 12/22/2020   CL 104 12/22/2020   CREATININE 0.86 12/22/2020   BUN 25 (H) 12/22/2020   CO2 27 12/22/2020   TSH 4.31 12/22/2020   HGBA1C 6.0 12/22/2020   MICROALBUR 0.4 02/21/2019    CT Head Wo  Contrast  Result Date: 11/01/2020 CLINICAL DATA:  Right jaw pain, arm pain EXAM: CT HEAD WITHOUT CONTRAST TECHNIQUE: Contiguous axial images were obtained from the base of the skull through the vertex without intravenous contrast. COMPARISON:  None. FINDINGS: Brain: No acute intracranial abnormality. Specifically, no hemorrhage, hydrocephalus, mass lesion, acute infarction, or significant intracranial injury. Vascular: No hyperdense vessel or unexpected calcification. Skull: No acute calvarial abnormality. Sinuses/Orbits: Visualized paranasal sinuses and mastoids clear. Orbital soft tissues unremarkable. Other: None IMPRESSION: No acute intracranial abnormality. Electronically Signed   By: Rolm Baptise M.D.   On: 11/01/2020 11:08   CT Cervical Spine Wo Contrast  Result Date: 11/01/2020 CLINICAL DATA:  Chronic neck pain, right arm pain  EXAM: CT CERVICAL SPINE WITHOUT CONTRAST TECHNIQUE: Multidetector CT imaging of the cervical spine was performed without intravenous contrast. Multiplanar CT image reconstructions were also generated. COMPARISON:  None. FINDINGS: Alignment: Normal Skull base and vertebrae: No acute fracture. No primary bone lesion or focal pathologic process. Soft tissues and spinal canal: No prevertebral fluid or swelling. No visible canal hematoma. Disc levels: Disc spaces maintained. No neural foraminal narrowing. Upper chest: No acute findings Other: None IMPRESSION: No acute bony abnormality. Electronically Signed   By: Rolm Baptise M.D.   On: 11/01/2020 11:09    Assessment & Plan:   Problem List Items Addressed This Visit       Unprioritized   Cellulitis and abscess of right lower extremity    The insect bites on her right ankle have developed in pustules,  Will treat with doxycycline        COPD (chronic obstructive pulmonary disease) ([place])    Secondary to tobacco abuse.  She has quit smoking .  No symptoms at rest or with work .        Intractable right heel pain -  Primary    Suggestive of bone spur.  Continue use of gel pads. meloxicam and tramadol for pain management.   Orthopedic referral made.        Relevant Orders   Ambulatory referral to Orthopedic Surgery   Obesity    With prediabetes suggested by recent a1c.  ozempic trial encouraged;  No contraindications exist.   Lab Results  Component Value Date   HGBA1C 6.0 12/22/2020          Prediabetes    She is at risk for Type 2 DM given her A1c , obesity and lack of sedentary lifestyle.  Trial of Ozempic offered and accepted.        Tobacco abuse    congratulated her for quitting.  Reviewed lifestyle modifications which would help her resolve        Weight gain   Relevant Orders   TSH (Completed)   Hemoglobin A1c (Completed)   Comprehensive metabolic panel (Completed)   Lipid panel (Completed)    I have discontinued Mardene Celeste L. Lanney Gins Schroth"'s varenicline and methylPREDNISolone. I have also changed her traMADol. Additionally, I am having her start on meloxicam and doxycycline. Lastly, I am having her maintain her omeprazole, metoprolol succinate, citalopram, isosorbide mononitrate, celecoxib, amLODipine, albuterol, and acyclovir.  Meds ordered this encounter  Medications   meloxicam (MOBIC) 15 MG tablet    Sig: Take 1 tablet (15 mg total) by mouth daily.    Dispense:  30 tablet    Refill:  0   doxycycline (VIBRA-TABS) 100 MG tablet    Sig: Take 1 tablet (100 mg total) by mouth 2 (two) times daily.    Dispense:  14 tablet    Refill:  0   traMADol (ULTRAM) 50 MG tablet    Sig: Take 1 tablet (50 mg total) by mouth every 12 (twelve) hours as needed for moderate pain.    Dispense:  60 tablet    Refill:  2   I provided  30 minutes of  face-to-face time during this encounter reviewing patient's current complaints of heel pain weight gain, reviewing past surgeries, labs and imaging studies, providing counseling on the above mentioned problems , and coordination  of care .    Medications Discontinued During This Encounter  Medication Reason   varenicline (CHANTIX PAK) 0.5 MG X 11 & 1 MG X 42 tablet  methylPREDNISolone (MEDROL DOSEPAK) 4 MG TBPK tablet    traMADol (ULTRAM) 50 MG tablet Reorder    Follow-up: Return in about 6 months (around 06/24/2021).   Crecencio Mc, MD

## 2020-12-23 ENCOUNTER — Telehealth: Payer: Self-pay

## 2020-12-23 LAB — COMPREHENSIVE METABOLIC PANEL
ALT: 8 U/L (ref 0–35)
AST: 11 U/L (ref 0–37)
Albumin: 4.8 g/dL (ref 3.5–5.2)
Alkaline Phosphatase: 58 U/L (ref 39–117)
BUN: 25 mg/dL — ABNORMAL HIGH (ref 6–23)
CO2: 27 mEq/L (ref 19–32)
Calcium: 10.2 mg/dL (ref 8.4–10.5)
Chloride: 104 mEq/L (ref 96–112)
Creatinine, Ser: 0.86 mg/dL (ref 0.40–1.20)
GFR: 75.54 mL/min (ref >60.00)
Glucose, Bld: 81 mg/dL (ref 70–99)
Potassium: 4.5 mEq/L (ref 3.5–5.1)
Sodium: 139 mEq/L (ref 135–145)
Total Bilirubin: 0.3 mg/dL (ref 0.2–1.2)
Total Protein: 7.7 g/dL (ref 6.0–8.3)

## 2020-12-23 LAB — LIPID PANEL
Cholesterol: 237 mg/dL — ABNORMAL HIGH (ref 0–200)
HDL: 56.2 mg/dL (ref >39.00)
LDL Cholesterol: 151 mg/dL — ABNORMAL HIGH (ref 0–99)
NonHDL: 181.04
Total CHOL/HDL Ratio: 4
Triglycerides: 148 mg/dL (ref 0.0–149.0)
VLDL: 29.6 mg/dL (ref 0.0–40.0)

## 2020-12-23 LAB — TSH: TSH: 4.31 u[IU]/mL (ref 0.35–5.50)

## 2020-12-23 LAB — HEMOGLOBIN A1C: Hgb A1c MFr Bld: 6 % (ref 4.6–6.5)

## 2020-12-23 NOTE — Telephone Encounter (Signed)
PA for Tramadol has been submitted on covermymeds and approved.

## 2020-12-25 DIAGNOSIS — M79671 Pain in right foot: Secondary | ICD-10-CM | POA: Insufficient documentation

## 2020-12-25 DIAGNOSIS — R635 Abnormal weight gain: Secondary | ICD-10-CM | POA: Insufficient documentation

## 2020-12-25 DIAGNOSIS — L02415 Cutaneous abscess of right lower limb: Secondary | ICD-10-CM | POA: Insufficient documentation

## 2020-12-25 DIAGNOSIS — R7303 Prediabetes: Secondary | ICD-10-CM | POA: Insufficient documentation

## 2020-12-25 NOTE — Assessment & Plan Note (Signed)
With prediabetes suggested by recent a1c.  ozempic trial encouraged;  No contraindications exist.   Lab Results  Component Value Date   HGBA1C 6.0 12/22/2020

## 2020-12-25 NOTE — Assessment & Plan Note (Signed)
The insect bites on her right ankle have developed in pustules,  Will treat with doxycycline

## 2020-12-25 NOTE — Assessment & Plan Note (Addendum)
Suggestive of bone spur.  Continue use of gel pads. meloxicam and tramadol for pain management.   Orthopedic referral made.

## 2020-12-25 NOTE — Assessment & Plan Note (Signed)
She is at risk for Type 2 DM given her A1c , obesity and lack of sedentary lifestyle.  Trial of Ozempic offered and accepted.

## 2020-12-25 NOTE — Assessment & Plan Note (Signed)
Secondary to tobacco abuse.  She has quit smoking .  No symptoms at rest or with work .

## 2020-12-25 NOTE — Assessment & Plan Note (Signed)
congratulated her for quitting.  Reviewed lifestyle modifications which would help her resolve

## 2020-12-27 ENCOUNTER — Emergency Department
Admission: EM | Admit: 2020-12-27 | Discharge: 2020-12-27 | Disposition: A | Discharge status: Home / Self Care | Payer: 59 | Attending: Emergency Medicine | Admitting: Emergency Medicine

## 2020-12-27 ENCOUNTER — Telehealth: Payer: Self-pay

## 2020-12-27 ENCOUNTER — Other Ambulatory Visit: Payer: Self-pay

## 2020-12-27 DIAGNOSIS — J45909 Unspecified asthma, uncomplicated: Secondary | ICD-10-CM | POA: Insufficient documentation

## 2020-12-27 DIAGNOSIS — J449 Chronic obstructive pulmonary disease, unspecified: Secondary | ICD-10-CM | POA: Insufficient documentation

## 2020-12-27 DIAGNOSIS — K922 Gastrointestinal hemorrhage, unspecified: Secondary | ICD-10-CM

## 2020-12-27 DIAGNOSIS — Z79899 Other long term (current) drug therapy: Secondary | ICD-10-CM | POA: Insufficient documentation

## 2020-12-27 DIAGNOSIS — I10 Essential (primary) hypertension: Secondary | ICD-10-CM | POA: Insufficient documentation

## 2020-12-27 DIAGNOSIS — K625 Hemorrhage of anus and rectum: Secondary | ICD-10-CM | POA: Diagnosis present

## 2020-12-27 LAB — COMPREHENSIVE METABOLIC PANEL
ALT: 10 U/L (ref 0–44)
AST: 19 U/L (ref 15–41)
Albumin: 4 g/dL (ref 3.5–5.0)
Alkaline Phosphatase: 58 U/L (ref 38–126)
Anion gap: 7 (ref 5–15)
BUN: 21 mg/dL — ABNORMAL HIGH (ref 6–20)
CO2: 25 mmol/L (ref 22–32)
Calcium: 9.2 mg/dL (ref 8.9–10.3)
Chloride: 107 mmol/L (ref 98–111)
Creatinine, Ser: 0.69 mg/dL (ref 0.44–1.00)
GFR, Estimated: >60 mL/min (ref >60)
Glucose, Bld: 88 mg/dL (ref 70–99)
Potassium: 3.9 mmol/L (ref 3.5–5.1)
Sodium: 139 mmol/L (ref 135–145)
Total Bilirubin: 0.8 mg/dL (ref 0.3–1.2)
Total Protein: 7.3 g/dL (ref 6.5–8.1)

## 2020-12-27 LAB — CBC
HCT: 35.7 % — ABNORMAL LOW (ref 36.0–46.0)
Hemoglobin: 12.2 g/dL (ref 12.0–15.0)
MCH: 30.8 pg (ref 26.0–34.0)
MCHC: 34.2 g/dL (ref 30.0–36.0)
MCV: 90.2 fL (ref 80.0–100.0)
Platelets: 185 10*3/uL (ref 150–400)
RBC: 3.96 MIL/uL (ref 3.87–5.11)
RDW: 12.2 % (ref 11.5–15.5)
WBC: 4.4 10*3/uL (ref 4.0–10.5)
nRBC: 0 % (ref 0.0–0.2)

## 2020-12-27 NOTE — Telephone Encounter (Signed)
Patient is being seen in ED.

## 2020-12-27 NOTE — ED Notes (Addendum)
Pt to ED c/o bright red rectal bleeding that she noticed since last 2 days. Recently changed to meloxicam from other NSAID for chronic arthritis. Pt does see gastroenterologist, does take prilosec. Denies dizziness, CP, SOB. Dr Tamala Julian at bedside.

## 2020-12-27 NOTE — ED Provider Notes (Signed)
[facility] Emergency Department Provider Note  ____________________________________________   Event Date/Time   First MD Initiated Contact with Patient 12/27/20 [phone]     (approximate)  I have reviewed the triage vital signs and the nursing notes.   HISTORY  Chief Complaint Rectal Bleeding   HPI Melanie Fisher is a 55 y.o. female with a past medical history of anxiety, asthma, depression, HTN, HDL, OSA, tobacco abuse and GERD on omeprazole as well as reported recent colonoscopy showing a polyp followed by unremarkable colonoscopy about 2 years ago who presents for assessment of approximately 2 days of some intermittent episodes of bright red blood mixed with her brown poop.  Patient states she has a history of hemorrhoids as well although has never had bleeding in her underwear between bowel movements with this.  She has not had any abdominal pain, back pain or urinary symptoms, nausea, vomiting, chest pain or other clear acute associated sick symptoms.  She does note she recently switched to meloxicam last week by her PCP from another NSAID which she takes for spinal stenosis issues and is wondering this could be causing her bleeding.  She has not had any other blood thinners or issues of GI bleeding in the past.  No other acute concerns at this time.         Past Medical History:  Diagnosis Date   Allergy    Anxiety    Asthma    Depression    GERD (gastroesophageal reflux disease)    Heart murmur    Hyperlipidemia    Hypertension    Sleep apnea    Tobacco abuse     Patient Active Problem List   Diagnosis Date Noted   Intractable right heel pain 12/25/2020   Weight gain 12/25/2020   Prediabetes 12/25/2020   Cellulitis and abscess of right lower extremity 12/25/2020   Herpes simplex infection 05/28/2019   Widowed 05/21/2018   Tubular adenoma of colon 05/21/2018   B12 deficiency 05/21/2018   Sinusitis, acute 03/07/2017   COPD exacerbation  ([place]) 12/15/2016   Insomnia due to anxiety and fear 04/10/2015   Polyneuropathy 02/10/2015   Cervical disc disorder with radiculopathy of cervical region 12/25/2014   Essential hypertension 08/19/2014   Rib pain on left side 03/23/2014   Degenerative disc disease, thoracic 03/23/2014   Nonallopathic lesion of thoracic region 03/23/2014   Nonallopathic lesion-rib cage 03/23/2014   Personal history of colonic polyps 02/11/2014   Obesity 07/01/2013   History of tobacco abuse 03/15/2013   Routine general medical examination at a health care facility 09/04/2012   COPD (chronic obstructive pulmonary disease) ([place]) 05/26/2012   Chronic shoulder pain 04/09/2012   Generalized anxiety disorder 04/06/2012   Tobacco abuse 04/06/2012   Depression with anxiety    Heart murmur    Hyperlipidemia    Allergy     Past Surgical History:  Procedure Laterality Date   ABDOMINAL HYSTERECTOMY  2008   partial   COLONOSCOPY WITH PROPOFOL N/A 06/04/2018   Procedure: COLONOSCOPY WITH PROPOFOL;  Surgeon: Jonathon Bellows, MD;  Location[facility] ENDOSCOPY;  Service: Gastroenterology;  Laterality: N/A;   right toe bunionectomy     right wrist ganglion cyst removal      Prior to Admission medications   Medication Sig Start Date End Date Taking? Authorizing Provider  acyclovir (ZOVIRAX) 400 MG tablet TAKE 1 TABLET BY MOUTH ONCE DAILY FOR  PREVENTION  OF  OUTBREAK 11/09/20   Crecencio Mc, MD  albuterol (VENTOLIN  HFA) 108 (90 Base) MCG/ACT inhaler Inhale 2 puffs into the lungs every 6 (six) hours as needed for wheezing or shortness of breath. 11/09/20   Crecencio Mc, MD  amLODipine (NORVASC) 5 MG tablet Take 1 tablet (5 mg total) by mouth daily. 11/09/20   Crecencio Mc, MD  celecoxib (CELEBREX) 200 MG capsule Take 1 capsule (200 mg total) by mouth daily. 11/09/20   Crecencio Mc, MD  citalopram (CELEXA) 20 MG tablet Take 1 tablet (20 mg total) by mouth daily. 11/09/20   Crecencio Mc, MD  doxycycline  (VIBRA-TABS) 100 MG tablet Take 1 tablet (100 mg total) by mouth 2 (two) times daily. 12/22/20   Crecencio Mc, MD  isosorbide mononitrate (IMDUR) 30 MG 24 hr tablet Take 1 tablet (30 mg total) by mouth daily. 11/09/20   Crecencio Mc, MD  meloxicam (MOBIC) 15 MG tablet Take 1 tablet (15 mg total) by mouth daily. 12/22/20   Crecencio Mc, MD  metoprolol succinate (TOPROL-XL) 50 MG 24 hr tablet TAKE 1 TABLET BY MOUTH ONCE DAILY WITH  OR  IMMEDIATELY  FOLLOWING  A  MEAL 11/09/20   Crecencio Mc, MD  omeprazole (PRILOSEC) 40 MG capsule Take 1 capsule (40 mg total) by mouth daily. 11/09/20   Crecencio Mc, MD  traMADol (ULTRAM) 50 MG tablet Take 1 tablet (50 mg total) by mouth every 12 (twelve) hours as needed for moderate pain. 12/22/20   Crecencio Mc, MD    Allergies Patient has no known allergies.  Family History  Problem Relation Age of Onset   Kidney disease Mother 63       ESKD, partial neprhectomy   COPD Mother    Heart disease Father        CABG at age 64   Alcohol abuse Father        until late 26   Diabetes Father    Hypertension Father    Diabetes Sister    Cancer Sister        uterine ca   Cancer Maternal Uncle 65       stage 4 colon CA   Colon cancer Maternal Uncle    Esophageal cancer Neg Hx    Pancreatic cancer Neg Hx    Rectal cancer Neg Hx    Stomach cancer Neg Hx    Breast cancer Neg Hx     Social History Social History   Tobacco Use   Smoking status: Former    Packs/day: 0.50    Years: 21.00    Pack years: 10.50    Types: Cigarettes    Quit date: 08/2020    Years since quitting: 0.3   Smokeless tobacco: Never  Vaping Use   Vaping Use: Never used  Substance Use Topics   Alcohol use: Not Currently    Comment: quit drinking    Drug use: No    Review of Systems  Review of Systems  Constitutional:  Negative for chills and fever.  HENT:  Negative for sore throat.   Eyes:  Negative for pain.  Respiratory:  Negative for cough and stridor.    Cardiovascular:  Negative for chest pain.  Gastrointestinal:  Positive for blood in stool. Negative for vomiting.  Genitourinary:  Negative for dysuria.  Musculoskeletal:  Negative for myalgias.  Skin:  Negative for rash.  Neurological:  Negative for seizures, loss of consciousness and headaches.  Psychiatric/Behavioral:  Negative for suicidal ideas.   All other systems reviewed and  are negative.    ____________________________________________   PHYSICAL EXAM:  VITAL SIGNS: ED Triage Vitals  Enc Vitals Group     BP 12/27/20 0707 (!) 177/88     Pulse Rate 12/27/20 0707 (!) 59     Resp 12/27/20 0707 15     Temp 12/27/20 0707 98.1 F (36.7 C)     Temp Source 12/27/20 0707 Oral     SpO2 12/27/20 0707 100 %     Weight 12/27/20 0708 202 lb (91.6 kg)     Height 12/27/20 0708 5\' 3"  (1.6 m)     Head Circumference --      Peak Flow --      Pain Score 12/27/20 0707 0     Pain Loc --      Pain Edu? --      Excl. in [place]? --    Vitals:   12/27/20 0707 12/27/20 0800  BP: (!) 177/88 (!) 148/89  Pulse: (!) 59 (!) 50  Resp: 15 16  Temp: 98.1 F (36.7 C)   SpO2: 100% 98%   Physical Exam Vitals and nursing note reviewed.  Constitutional:      General: She is not in acute distress.    Appearance: She is well-developed. She is obese.  HENT:     Head: Normocephalic and atraumatic.     Right Ear: External ear normal.     Left Ear: External ear normal.     Nose: Nose normal.  Eyes:     Conjunctiva/sclera: Conjunctivae normal.  Cardiovascular:     Rate and Rhythm: Normal rate and regular rhythm.     Heart sounds: No murmur heard. Pulmonary:     Effort: Pulmonary effort is normal. No respiratory distress.     Breath sounds: Normal breath sounds.  Abdominal:     Palpations: Abdomen is soft.     Tenderness: There is no abdominal tenderness.  Musculoskeletal:     Cervical back: Neck supple.  Skin:    General: Skin is warm and dry.     Capillary Refill: Capillary refill takes  less than 2 seconds.  Neurological:     Mental Status: She is alert and oriented to person, place, and time.    Rectal exam remarkable for Hemoccult positive stool with some mucousy bright red streaks mixed in.  External hemorrhoids are visible.  No significant internal hemorrhoids palpated or active bleeding noted. ____________________________________________   LABS (all labs ordered are listed, but only abnormal results are displayed)  Labs Reviewed  COMPREHENSIVE METABOLIC PANEL - Abnormal; Notable for the following components:      Result Value   BUN 21 (*)    All other components within normal limits  CBC - Abnormal; Notable for the following components:   HCT 35.7 (*)    All other components within normal limits  TYPE AND SCREEN   ____________________________________________  EKG  ____________________________________________  RADIOLOGY  ED MD interpretation:   Official radiology report(s): No results found.  ____________________________________________   PROCEDURES  Procedure(s) performed (including Critical Care):  Procedures   ____________________________________________   INITIAL IMPRESSION / ASSESSMENT AND PLAN / ED COURSE      Patient presents with above to history exam after noticing some bright red blood mixed in her stools and intermittently noted in her underwear over the last 2 days.  This is in the setting of recently switching NSAIDs patient not being otherwise anticoagulated or have a history of GI bleeding.  On arrival she is hypertensive with BP  of 177 108 with otherwise stable vital signs on room air.  She has no clear associated symptoms including abdominal pain, vomiting, diarrhea, constipation or urinary symptoms.  Differential includes possible bleeding internal hemorrhoid, polyp, and less like a higher GI bleed given appearance of some bright red blood mixed with stool on exam.  No fever or leukocytosis or pain to suggest diverticulitis  at this time.  Additional differential includes latency and AVM.  However given stable vitals hemoglobin I was12.2 with no active bleeding noted on exam I believe patient requires emergent colonoscopy or CT imaging at this time and is stable for close outpatient GI follow-up.  CMP unremarkable for any significant electrolyte or metabolic derangements.  No history of cirrhosis or other risk factors for significant bleeding.  Advised her to discontinue NSAIDs and continue taking omeprazole 40 mg/day which she is already doing.  She does not drink alcohol does not have any other clear risk factors for gastritis.  In addition she denies any symptoms of acute anemia including chest pain, shortness of breath, lightheadedness, dizziness or extreme fatigue.  Discussed these symptoms with the patient and if she develops any of these to return immediately to the emergency room or she develops any persistent bleeding.  She is amenable with plan.  Discharged stable condition.  Strict return cautions advised and discussed.         ____________________________________________   FINAL CLINICAL IMPRESSION(S) / ED DIAGNOSES  Final diagnoses:  Acute GI bleeding    Medications - No data to display   ED Discharge Orders     None        Note:  This document was prepared using Dragon voice recognition software and may include unintentional dictation errors.    Lucrezia Starch, MD 12/27/20 [phone]

## 2020-12-27 NOTE — ED Triage Notes (Signed)
Pt c/o bright red blood from her rectum when she wipes for the past couple of days, states her PCP changed her to meloxicam last thursday

## 2020-12-30 DIAGNOSIS — M722 Plantar fascial fibromatosis: Secondary | ICD-10-CM | POA: Diagnosis not present

## 2020-12-30 NOTE — Telephone Encounter (Signed)
PA has been apprved through 06/21/2021.

## 2021-01-18 ENCOUNTER — Other Ambulatory Visit: Payer: Self-pay | Admitting: Internal Medicine

## 2021-01-24 ENCOUNTER — Telehealth: Payer: Self-pay | Admitting: Internal Medicine

## 2021-01-24 NOTE — Telephone Encounter (Signed)
Awaiting Access Nurse Triage note.

## 2021-01-24 NOTE — Telephone Encounter (Signed)
Patient called and has a little blood in Urine and thinks she has a UT. No appointments available. Patient transferred to Access Nurse.

## 2021-01-24 NOTE — Telephone Encounter (Signed)
Spoke with pt to let her know that there is no available appts in the office this week and advised her to go to UC. Pt stated that she can not afford to go to UC because it will cost her $100. Pt stated that she has taken some AZO today and it has helped with the pain. Pt is wanting to know if she can come by tomorrow and give [place] a urine sample. Pt is aware that you are out of the office until 1pm tomorrow. Pt stated that she will be fine until then.

## 2021-01-25 ENCOUNTER — Other Ambulatory Visit (INDEPENDENT_AMBULATORY_CARE_PROVIDER_SITE_OTHER): Payer: 59

## 2021-01-25 ENCOUNTER — Other Ambulatory Visit: Payer: Self-pay

## 2021-01-25 DIAGNOSIS — R300 Dysuria: Secondary | ICD-10-CM | POA: Diagnosis not present

## 2021-01-25 NOTE — Telephone Encounter (Signed)
Spoke with pt and scheduled her for both a lab and telephone appt per Dr. Derrel Nip.

## 2021-01-26 LAB — URINALYSIS, ROUTINE W REFLEX MICROSCOPIC

## 2021-01-27 LAB — URINE CULTURE
MICRO NUMBER:: 12219609
SPECIMEN QUALITY:: ADEQUATE

## 2021-01-28 ENCOUNTER — Encounter: Payer: Self-pay | Admitting: Internal Medicine

## 2021-01-28 ENCOUNTER — Telehealth (INDEPENDENT_AMBULATORY_CARE_PROVIDER_SITE_OTHER): Payer: 59 | Admitting: Internal Medicine

## 2021-01-28 DIAGNOSIS — B9620 Unspecified Escherichia coli [E. coli] as the cause of diseases classified elsewhere: Secondary | ICD-10-CM

## 2021-01-28 DIAGNOSIS — N390 Urinary tract infection, site not specified: Secondary | ICD-10-CM | POA: Insufficient documentation

## 2021-01-28 MED ORDER — HYOSCYAMINE SULFATE 0.125 MG PO TABS: 0.1250 mg | ORAL_TABLET | ORAL | 0 refills | Status: DC | PRN

## 2021-01-28 MED ORDER — CIPROFLOXACIN HCL 250 MG PO TABS: 250.0000 mg | ORAL_TABLET | Freq: Two times a day (BID) | ORAL | 0 refills | Status: AC

## 2021-01-28 NOTE — Assessment & Plan Note (Signed)
Sensitive to ciprofloxacin.  rx for 5 days sent to pharmacy along with antispasmodic

## 2021-01-28 NOTE — Progress Notes (Signed)
Virtual Visit via Lee Note  This visit type was conducted due to national recommendations for restrictions regarding the COVID-19 pandemic (e.g. social distancing).  This format is felt to be most appropriate for this patient at this time.  All issues noted in this document were discussed and addressed.  No physical exam was performed (except for noted visual exam findings with Video Visits).   I connected withNAME@ on 01/28/21 at  1:15 PM EDT by a video enabled telemedicine application and verifiedthat I am speaking with the correct person using two identifiers. Location patient: home Location provider: work or home office Persons participating in the virtual visit: patient, provider  I discussed the limitations, risks, security and privacy concerns of performing an evaluation and management service by telephone and the availability of in person appointments. I also discussed with the patient that there may be a patient responsible charge related to this service. The patient expressed understanding and agreed to proceed.  Reason for visit: possible UTI  HPI:  55 yr old female presents with dysuria since Monday.  She describes Pain at end of void.  Urine has been Blood tinged and has had some  left sided inguinal pain worse in supine position .  Has been taking OTC Azo.. sexually active with new husband.  No pool or other swimming since July 5 .Denies fevers,  nausea back pain     ROS: See pertinent positives and negatives per HPI. [name]  Past Medical History:  Diagnosis Date   Allergy    Anxiety    Asthma    Depression    GERD (gastroesophageal reflux disease)    Heart murmur    Hyperlipidemia    Hypertension    Sleep apnea    Tobacco abuse     Past Surgical History:  Procedure Laterality Date   ABDOMINAL HYSTERECTOMY  2008   partial   COLONOSCOPY WITH PROPOFOL N/A 06/04/2018   Procedure: COLONOSCOPY WITH PROPOFOL;  Surgeon: Jonathon Bellows, MD;  Location: [facility] ENDOSCOPY;   Service: Gastroenterology;  Laterality: N/A;   right toe bunionectomy     right wrist ganglion cyst removal      Family History  Problem Relation Age of Onset   Kidney disease Mother 11       ESKD, partial neprhectomy   COPD Mother    Heart disease Father        CABG at age 80   Alcohol abuse Father        until late 25   Diabetes Father    Hypertension Father    Diabetes Sister    Cancer Sister        uterine ca   Cancer Maternal Uncle 60       stage 4 colon CA   Colon cancer Maternal Uncle    Esophageal cancer Neg Hx    Pancreatic cancer Neg Hx    Rectal cancer Neg Hx    Stomach cancer Neg Hx    Breast cancer Neg Hx     SOCIAL HX:  reports that she quit smoking about 5 months ago. Her smoking use included cigarettes. She has a 10.50 pack-year smoking history. She has never used smokeless tobacco. She reports that she does not currently use alcohol. She reports that she does not use drugs.    Current Outpatient Medications:    acyclovir (ZOVIRAX) 400 MG tablet, TAKE 1 TABLET BY MOUTH ONCE DAILY FOR  PREVENTION  OF  OUTBREAK, Disp: 90 tablet, Rfl: 0   albuterol (  VENTOLIN HFA) 108 (90 Base) MCG/ACT inhaler, Inhale 2 puffs into the lungs every 6 (six) hours as needed for wheezing or shortness of breath., Disp: 18 g, Rfl: 11   amLODipine (NORVASC) 5 MG tablet, Take 1 tablet (5 mg total) by mouth daily., Disp: 90 tablet, Rfl: 1   celecoxib (CELEBREX) 200 MG capsule, Take 1 capsule (200 mg total) by mouth daily., Disp: 90 capsule, Rfl: 0   ciprofloxacin (CIPRO) 250 MG tablet, Take 1 tablet (250 mg total) by mouth 2 (two) times daily for 3 days., Disp: 10 tablet, Rfl: 0   citalopram (CELEXA) 20 MG tablet, Take 1 tablet (20 mg total) by mouth daily., Disp: 90 tablet, Rfl: 0   hyoscyamine (LEVSIN) 0.125 MG tablet, Take 1 tablet (0.125 mg total) by mouth every 4 (four) hours as needed., Disp: 30 tablet, Rfl: 0   isosorbide mononitrate (IMDUR) 30 MG 24 hr tablet, Take 1 tablet (30 mg  total) by mouth daily., Disp: 30 tablet, Rfl: 2   metoprolol succinate (TOPROL-XL) 50 MG 24 hr tablet, TAKE 1 TABLET BY MOUTH ONCE DAILY WITH  OR  IMMEDIATELY  FOLLOWING  A  MEAL, Disp: 90 tablet, Rfl: 0   omeprazole (PRILOSEC) 40 MG capsule, Take 1 capsule (40 mg total) by mouth daily., Disp: 90 capsule, Rfl: 0   traMADol (ULTRAM) 50 MG tablet, Take 1 tablet (50 mg total) by mouth every 12 (twelve) hours as needed for moderate pain., Disp: 60 tablet, Rfl: 2  EXAM:  VITALS per patient if applicable:  GENERAL: alert, oriented, appears well and in no acute distress  HEENT: atraumatic, conjunttiva clear, no obvious abnormalities on inspection of external nose and ears  NECK: normal movements of the head and neck  LUNGS: on inspection no signs of respiratory distress, breathing rate appears normal, no obvious gross SOB, gasping or wheezing  CV: no obvious cyanosis  MS: moves all visible extremities without noticeable abnormality  PSYCH/NEURO: pleasant and cooperative, no obvious depression or anxiety, speech and thought processing grossly intact  ASSESSMENT AND PLAN:  Discussed the following assessment and plan:  E. coli UTI  E. coli UTI Sensitive to ciprofloxacin.  rx for 5 days sent to pharmacy along with antispasmodic      I discussed the assessment and treatment plan with the patient. The patient was provided an opportunity to ask questions and all were answered. The patient agreed with the plan and demonstrated an understanding of the instructions.   The patient was advised to call back or seek an in-person evaluation if the symptoms worsen or if the condition fails to improve as anticipated.   I spent 30 minutes dedicated to the care of this patient on the date of this encounter to include pre-visit review of his medical history,  Face-to-face time with the patient , and post visit ordering of testing and therapeutics.    Crecencio Mc, MD

## 2021-02-04 ENCOUNTER — Other Ambulatory Visit: Payer: Self-pay | Admitting: Internal Medicine

## 2021-02-04 DIAGNOSIS — I10 Essential (primary) hypertension: Secondary | ICD-10-CM

## 2021-02-04 DIAGNOSIS — K219 Gastro-esophageal reflux disease without esophagitis: Secondary | ICD-10-CM

## 2021-02-05 ENCOUNTER — Other Ambulatory Visit: Payer: Self-pay | Admitting: Internal Medicine

## 2021-02-07 ENCOUNTER — Other Ambulatory Visit: Payer: Self-pay | Admitting: Internal Medicine

## 2021-02-14 ENCOUNTER — Other Ambulatory Visit: Payer: Self-pay | Admitting: Internal Medicine

## 2021-02-17 ENCOUNTER — Other Ambulatory Visit: Payer: Self-pay | Admitting: Internal Medicine

## 2021-02-26 ENCOUNTER — Other Ambulatory Visit: Payer: Self-pay | Admitting: Internal Medicine

## 2021-02-27 ENCOUNTER — Other Ambulatory Visit: Payer: Self-pay

## 2021-02-27 DIAGNOSIS — K219 Gastro-esophageal reflux disease without esophagitis: Secondary | ICD-10-CM

## 2021-02-28 MED ORDER — OMEPRAZOLE 40 MG PO CPDR: 40.0000 mg | DELAYED_RELEASE_CAPSULE | Freq: Every day | ORAL | 0 refills | Status: DC

## 2021-03-01 ENCOUNTER — Telehealth: Payer: Self-pay

## 2021-03-01 ENCOUNTER — Ambulatory Visit: Payer: 59 | Admitting: Gastroenterology

## 2021-03-01 NOTE — Telephone Encounter (Signed)
Prior Authorization for Omeprazole has been submitted to covermymeds. Awaiting Determination  Key: [id number]

## 2021-03-03 NOTE — Telephone Encounter (Signed)
PA has been approved through 03/01/2022.  LM for pt to call back to let her know that the medication has been approved.

## 2021-03-28 ENCOUNTER — Telehealth: Payer: Self-pay | Admitting: Internal Medicine

## 2021-03-28 ENCOUNTER — Emergency Department
Admission: EM | Admit: 2021-03-28 | Discharge: 2021-03-28 | Disposition: A | Discharge status: Home / Self Care | Payer: 59 | Attending: Student in an Organized Health Care Education/Training Program | Admitting: Student in an Organized Health Care Education/Training Program

## 2021-03-28 ENCOUNTER — Other Ambulatory Visit: Payer: Self-pay

## 2021-03-28 DIAGNOSIS — J45909 Unspecified asthma, uncomplicated: Secondary | ICD-10-CM | POA: Insufficient documentation

## 2021-03-28 DIAGNOSIS — R42 Dizziness and giddiness: Secondary | ICD-10-CM

## 2021-03-28 DIAGNOSIS — I10 Essential (primary) hypertension: Secondary | ICD-10-CM | POA: Diagnosis not present

## 2021-03-28 DIAGNOSIS — R002 Palpitations: Secondary | ICD-10-CM | POA: Diagnosis not present

## 2021-03-28 DIAGNOSIS — J449 Chronic obstructive pulmonary disease, unspecified: Secondary | ICD-10-CM | POA: Insufficient documentation

## 2021-03-28 DIAGNOSIS — Z87891 Personal history of nicotine dependence: Secondary | ICD-10-CM | POA: Diagnosis not present

## 2021-03-28 DIAGNOSIS — Z79899 Other long term (current) drug therapy: Secondary | ICD-10-CM | POA: Insufficient documentation

## 2021-03-28 LAB — CBC
HCT: 35 % — ABNORMAL LOW (ref 36.0–46.0)
Hemoglobin: 11.5 g/dL — ABNORMAL LOW (ref 12.0–15.0)
MCH: 30.1 pg (ref 26.0–34.0)
MCHC: 32.9 g/dL (ref 30.0–36.0)
MCV: 91.6 fL (ref 80.0–100.0)
Platelets: 195 10*3/uL (ref 150–400)
RBC: 3.82 MIL/uL — ABNORMAL LOW (ref 3.87–5.11)
RDW: 12.4 % (ref 11.5–15.5)
WBC: 4.8 10*3/uL (ref 4.0–10.5)
nRBC: 0 % (ref 0.0–0.2)

## 2021-03-28 LAB — BASIC METABOLIC PANEL
Anion gap: 8 (ref 5–15)
BUN: 22 mg/dL — ABNORMAL HIGH (ref 6–20)
CO2: 27 mmol/L (ref 22–32)
Calcium: 9.6 mg/dL (ref 8.9–10.3)
Chloride: 106 mmol/L (ref 98–111)
Creatinine, Ser: 0.97 mg/dL (ref 0.44–1.00)
GFR, Estimated: >60 mL/min (ref >60)
Glucose, Bld: 91 mg/dL (ref 70–99)
Potassium: 3.9 mmol/L (ref 3.5–5.1)
Sodium: 141 mmol/L (ref 135–145)

## 2021-03-28 LAB — URINALYSIS, COMPLETE (UACMP) WITH MICROSCOPIC
Bacteria, UA: NONE SEEN
Bilirubin Urine: NEGATIVE
Glucose, UA: NEGATIVE mg/dL
Hgb urine dipstick: NEGATIVE
Ketones, ur: NEGATIVE mg/dL
Leukocytes,Ua: NEGATIVE
Nitrite: NEGATIVE
Protein, ur: NEGATIVE mg/dL
Specific Gravity, Urine: 1.023 (ref 1.005–1.030)
pH: 6 (ref 5.0–8.0)

## 2021-03-28 LAB — TROPONIN I (HIGH SENSITIVITY)
Troponin I (High Sensitivity): 2 ng/L (ref <18)
Troponin I (High Sensitivity): <2 ng/L (ref <18)

## 2021-03-28 MED ORDER — AMLODIPINE BESYLATE 10 MG PO TABS: 10.0000 mg | ORAL_TABLET | Freq: Every day | ORAL | 0 refills | Status: DC

## 2021-03-28 MED ORDER — MECLIZINE HCL 25 MG PO TABS: 25.0000 mg | ORAL_TABLET | Freq: Three times a day (TID) | ORAL | 0 refills | Status: DC | PRN

## 2021-03-28 NOTE — Discharge Instructions (Addendum)
Start taking 10 mg of amlodipine daily.  We have given you a 1 month prescription for this, or you can just take 2 of your current 5 mg pills daily to make 10 mg.  You may take the meclizine as needed and see if it helps with the dizziness.  Follow-up with your doctor next week as scheduled.  In the meantime, return to the emergency department for new, worsening, or persistent severe dizziness, lightheadedness, severe headache, chest pain, difficulty breathing, worsening palpitations, weakness or numbness, vision changes, or any other new or worsening symptoms that concern you.

## 2021-03-28 NOTE — Telephone Encounter (Signed)
Patient informed, Due to the high volume of calls and your symptoms we have to forward your call to our Triage Nurse to expedient your call. Please hold for the transfer.  Patient transferred to Access Nurse. Due to feeling light headed and dizzy for the past week and thinking it may be related to her blood pressure.No openings in office or virtual.

## 2021-03-28 NOTE — ED Provider Notes (Signed)
[facility] Emergency Department Provider Note ____________________________________________   Event Date/Time   First MD Initiated Contact with Patient 03/28/21 1848     (approximate)  I have reviewed the triage vital signs and the nursing notes.   HISTORY  Chief Complaint Dizziness    HPI Melanie Fisher is a 55 y.o. female with PMH as noted below including hypertension and asthma who presents with intermittent episodes of dizziness over approximately last week.  She states that they feel somewhat like lightheadedness but also as if things are spinning around her.  They occur intermittently throughout the day and usually last a few minutes.  They are not related to movement or turning her head, but sometimes do happen when she stands up from sitting.  She reports associated palpitations, described as a feeling of her heart racing.  She denies any nausea or vomiting and has no diarrhea, chest pain, fever, or shortness of breath.  She has not had any medication changes recently.  She states that she does drink some caffeine but has not increased it recently and denies drug or alcohol use.  She had a few elevated blood pressure readings over the last few weeks, once at home and once at her dentist, with systolics in the 384Y and 180s.  She has not had any recent changes in her blood pressure medications.    Past Medical History:  Diagnosis Date   Allergy    Anxiety    Asthma    Depression    GERD (gastroesophageal reflux disease)    Heart murmur    Hyperlipidemia    Hypertension    Sleep apnea    Tobacco abuse     Patient Active Problem List   Diagnosis Date Noted   E. coli UTI 01/28/2021   Intractable right heel pain 12/25/2020   Weight gain 12/25/2020   Prediabetes 12/25/2020   Herpes simplex infection 05/28/2019   Widowed 05/21/2018   Tubular adenoma of colon 05/21/2018   B12 deficiency 05/21/2018   Sinusitis, acute 03/07/2017   Insomnia due  to anxiety and fear 04/10/2015   Polyneuropathy 02/10/2015   Cervical disc disorder with radiculopathy of cervical region 12/25/2014   Essential hypertension 08/19/2014   Rib pain on left side 03/23/2014   Degenerative disc disease, thoracic 03/23/2014   Nonallopathic lesion of thoracic region 03/23/2014   Nonallopathic lesion-rib cage 03/23/2014   Personal history of colonic polyps 02/11/2014   Obesity 07/01/2013   History of tobacco abuse 03/15/2013   Routine general medical examination at a health care facility 09/04/2012   COPD (chronic obstructive pulmonary disease) ([place]) 05/26/2012   Chronic shoulder pain 04/09/2012   Generalized anxiety disorder 04/06/2012   Tobacco abuse 04/06/2012   Depression with anxiety    Heart murmur    Hyperlipidemia    Allergy     Past Surgical History:  Procedure Laterality Date   ABDOMINAL HYSTERECTOMY  2008   partial   COLONOSCOPY WITH PROPOFOL N/A 06/04/2018   Procedure: COLONOSCOPY WITH PROPOFOL;  Surgeon: Jonathon Bellows, MD;  Location: [facility] ENDOSCOPY;  Service: Gastroenterology;  Laterality: N/A;   right toe bunionectomy     right wrist ganglion cyst removal      Prior to Admission medications   Medication Sig Start Date End Date Taking? Authorizing Provider  amLODipine (NORVASC) 10 MG tablet Take 1 tablet (10 mg total) by mouth daily. 03/28/21 04/27/21 Yes Arta Silence, MD  meclizine (ANTIVERT) 25 MG tablet Take 1 tablet (25 mg total)  by mouth 3 (three) times daily as needed for dizziness. 03/28/21  Yes Arta Silence, MD  acyclovir (ZOVIRAX) 400 MG tablet TAKE 1 TABLET BY MOUTH EVERY DAY FOR PREVENTION OR OUTBREAK 02/16/21   Crecencio Mc, MD  albuterol (VENTOLIN HFA) 108 (90 Base) MCG/ACT inhaler Inhale 2 puffs into the lungs every 6 (six) hours as needed for wheezing or shortness of breath. 11/09/20   Crecencio Mc, MD  celecoxib (CELEBREX) 200 MG capsule TAKE 1 CAPSULE(200 MG) BY MOUTH DAILY 02/04/21   Crecencio Mc, MD   citalopram (CELEXA) 20 MG tablet TAKE 1 TABLET(20 MG) BY MOUTH DAILY 02/04/21   Crecencio Mc, MD  hyoscyamine (LEVSIN) 0.125 MG tablet TAKE 1 TABLET(0.125 MG) BY MOUTH EVERY 4 HOURS AS NEEDED 02/07/21   Crecencio Mc, MD  hyoscyamine (LEVSIN) 0.125 MG tablet TAKE 1 TABLET(0.125 MG) BY MOUTH EVERY 4 HOURS AS NEEDED 02/28/21   Crecencio Mc, MD  isosorbide mononitrate (IMDUR) 30 MG 24 hr tablet Take 1 tablet (30 mg total) by mouth daily. 11/09/20   Crecencio Mc, MD  metoprolol succinate (TOPROL-XL) 50 MG 24 hr tablet TAKE 1 TABLET BY MOUTH EVERY DAY WITH OR IMMEDIATELY FOLLOWING A MEAL 02/04/21   Crecencio Mc, MD  omeprazole (PRILOSEC) 40 MG capsule Take 1 capsule (40 mg total) by mouth daily. 02/28/21   Crecencio Mc, MD  traMADol (ULTRAM) 50 MG tablet Take 1 tablet (50 mg total) by mouth every 12 (twelve) hours as needed for moderate pain. 12/22/20   Crecencio Mc, MD    Allergies Patient has no known allergies.  Family History  Problem Relation Age of Onset   Kidney disease Mother 20       ESKD, partial neprhectomy   COPD Mother    Heart disease Father        CABG at age 42   Alcohol abuse Father        until late 74   Diabetes Father    Hypertension Father    Diabetes Sister    Cancer Sister        uterine ca   Cancer Maternal Uncle 3       stage 4 colon CA   Colon cancer Maternal Uncle    Esophageal cancer Neg Hx    Pancreatic cancer Neg Hx    Rectal cancer Neg Hx    Stomach cancer Neg Hx    Breast cancer Neg Hx     Social History Social History   Tobacco Use   Smoking status: Former    Packs/day: 0.50    Years: 21.00    Pack years: 10.50    Types: Cigarettes    Quit date: 08/2020    Years since quitting: 0.6   Smokeless tobacco: Never  Vaping Use   Vaping Use: Never used  Substance Use Topics   Alcohol use: Not Currently    Comment: quit drinking    Drug use: No    Review of Systems  Constitutional: No fever/chills. Eyes: No visual  changes. ENT: No sore throat. Cardiovascular: Denies chest pain. Respiratory: Denies shortness of breath. Gastrointestinal: No vomiting or diarrhea.  Genitourinary: Negative for dysuria.  Musculoskeletal: Negative for back pain. Skin: Negative for rash. Neurological: Negative for headaches, focal weakness or numbness.   ____________________________________________   PHYSICAL EXAM:  VITAL SIGNS: ED Triage Vitals [03/28/21 1303]  Enc Vitals Group     BP (!) 180/96     Pulse Rate 76  Resp 18     Temp 98.3 F (36.8 C)     Temp Source Oral     SpO2 99 %     Weight 190 lb (86.2 kg)     Height 5\' 3"  (1.6 m)     Head Circumference      Peak Flow      Pain Score 0     Pain Loc      Pain Edu?      Excl. in [place]?     Constitutional: Alert and oriented. Well appearing and in no acute distress. Eyes: Conjunctivae are normal.  EOMI.  PERRLA.  No nystagmus. Head: Atraumatic. Nose: No congestion/rhinnorhea. Mouth/Throat: Mucous membranes are moist.   Neck: Normal range of motion.  Cardiovascular: Normal rate, regular rhythm. Grossly normal heart sounds.  Good peripheral circulation. Respiratory: Normal respiratory effort.  No retractions. Lungs CTAB. Gastrointestinal: Soft and nontender. No distention.  Genitourinary: No flank tenderness. Musculoskeletal: No lower extremity edema.  No calf or popliteal swelling or tenderness.  Extremities warm and well perfused.  Neurologic:  Normal speech and language.  Motor and sensory intact in all extremities.  No pronator drift.  Normal coordination.  Normal gait. Skin:  Skin is warm and dry. No rash noted. Psychiatric: Mood and affect are normal. Speech and behavior are normal.  ____________________________________________   LABS (all labs ordered are listed, but only abnormal results are displayed)  Labs Reviewed  BASIC METABOLIC PANEL - Abnormal; Notable for the following components:      Result Value   BUN 22 (*)    All other  components within normal limits  CBC - Abnormal; Notable for the following components:   RBC 3.82 (*)    Hemoglobin 11.5 (*)    HCT 35.0 (*)    All other components within normal limits  URINALYSIS, COMPLETE (UACMP) WITH MICROSCOPIC - Abnormal; Notable for the following components:   Color, Urine YELLOW (*)    APPearance CLEAR (*)    All other components within normal limits  CBG MONITORING, ED  POC URINE PREG, ED  TROPONIN I (HIGH SENSITIVITY)  TROPONIN I (HIGH SENSITIVITY)   ____________________________________________  EKG  ED ECG REPORT I, Arta Silence, the attending physician, personally viewed and interpreted this ECG.  Date: 03/28/2021 EKG Time: 1306 Rate: 72 Rhythm: normal sinus rhythm QRS Axis: normal Intervals: normal ST/T Wave abnormalities: normal Narrative Interpretation: no evidence of acute ischemia  ____________________________________________  RADIOLOGY    ____________________________________________   PROCEDURES  Procedure(s) performed: No  Procedures  Critical Care performed: No ____________________________________________   INITIAL IMPRESSION / ASSESSMENT AND PLAN / ED COURSE  Pertinent labs & imaging results that were available during my care of the patient were reviewed by me and considered in my medical decision making (see chart for details).   55 year old female with PMH as noted above including hypertension and asthma presents with intermittent episodes of dizziness over the last week along with elevated blood pressure readings.  I reviewed the past medical records in [place].  The patient was most recently seen in the ED on 7/11 with blood in her stool and was stable at that time.  She was previously seen in May with neck pain.  On exam currently, she is well-appearing.  Her vital signs are normal except for hypertension.  The physical exam is otherwise unremarkable.  Neurologic exam is normal.  The patient is able to ambulate  without difficulty and without any dizziness.  Lab work-up obtained from triage is  unremarkable.  Troponins are negative x2.  EKG is nonischemic and shows no evidence of arrhythmia.  Differential includes possible peripheral vertigo, dehydration, or symptoms related to the patient's blood pressure.  However, she has no evidence of endorgan dysfunction or hypertensive emergency.  There is no evidence of central vertigo or other CNS etiology given the intermittent nature of the symptoms and the normal neurologic exam.   At this time, the patient is stable for discharge home.  Since she has now had several reliable elevated blood pressure readings despite being compliant with her medications I will increase her dose of amlodipine.  I will also give her a trial of meclizine in case there is a vertical component to the dizziness.  I counseled her extensively on the work-up.  Return precautions given, and she expresses understanding.  She also has follow-up already arranged with her PMD for next week.  ____________________________________________   FINAL CLINICAL IMPRESSION(S) / ED DIAGNOSES  Final diagnoses:  Dizziness  Hypertension, unspecified type      NEW MEDICATIONS STARTED DURING THIS VISIT:  New Prescriptions   AMLODIPINE (NORVASC) 10 MG TABLET    Take 1 tablet (10 mg total) by mouth daily.   MECLIZINE (ANTIVERT) 25 MG TABLET    Take 1 tablet (25 mg total) by mouth 3 (three) times daily as needed for dizziness.     Note:  This document was prepared using Dragon voice recognition software and may include unintentional dictation errors.    Arta Silence, MD 03/28/21 2013

## 2021-03-28 NOTE — ED Provider Notes (Signed)
Emergency Medicine Provider Triage Evaluation Note  Melanie Fisher, a 55 y.o. female  was evaluated in triage.  Pt complains of with several months of intermittent episodes of heart racing and near-syncope. She notes nearly daily symptoms, without syncope, N/V, vision change, or weakness.   Review of Systems  Positive: Tachycardia, dizziness, near-syncope Negative: NV, weakness  Physical Exam  BP (!) 180/96 (BP Location: Left Arm)   Pulse 76   Temp 98.3 F (36.8 C) (Oral)   Resp 18   Ht 5\' 3"  (1.6 m)   Wt 86.2 kg   SpO2 99%   BMI 33.66 kg/m  Gen:   Awake, no distress  anxious Resp:  Normal effort CTA MSK:   Moves extremities without difficulty  Other:  CVS: RRR  Medical Decision Making  Medically screening exam initiated at 1:13 PM.  Appropriate orders placed.  Melanie Fisher was informed that the remainder of the evaluation will be completed by another provider, this initial triage assessment does not replace that evaluation, and the importance of remaining in the ED until their evaluation is complete.  Patient with ED evaluation of near-syncope with associated tachycardia and dizziness.    Melvenia Needles, PA-C 03/28/21 1317    Merlyn Lot, MD 03/28/21 [phone]

## 2021-03-28 NOTE — Telephone Encounter (Signed)
Patient was sent to ED by Access Nurse. She is currently at ED being evaluated.

## 2021-03-28 NOTE — ED Triage Notes (Signed)
Pt here with dizziness and states that her heart is racing. Pt states this started a week ago but her symptoms have gotten much worse. Pt denies N/V/D. Pt denies pain. Pt in NAD in triage.

## 2021-04-06 ENCOUNTER — Other Ambulatory Visit: Payer: Self-pay

## 2021-04-06 ENCOUNTER — Encounter: Payer: Self-pay | Admitting: Internal Medicine

## 2021-04-06 ENCOUNTER — Ambulatory Visit: Payer: 59 | Admitting: Internal Medicine

## 2021-04-06 VITALS — BP 142/84 | HR 69 | Temp 96.1°F | Ht 63.0 in | Wt 199.2 lb

## 2021-04-06 DIAGNOSIS — E162 Hypoglycemia, unspecified: Secondary | ICD-10-CM

## 2021-04-06 DIAGNOSIS — K219 Gastro-esophageal reflux disease without esophagitis: Secondary | ICD-10-CM | POA: Diagnosis not present

## 2021-04-06 DIAGNOSIS — J4521 Mild intermittent asthma with (acute) exacerbation: Secondary | ICD-10-CM | POA: Diagnosis not present

## 2021-04-06 DIAGNOSIS — R231 Pallor: Secondary | ICD-10-CM | POA: Diagnosis not present

## 2021-04-06 DIAGNOSIS — G4733 Obstructive sleep apnea (adult) (pediatric): Secondary | ICD-10-CM | POA: Diagnosis not present

## 2021-04-06 DIAGNOSIS — Z23 Encounter for immunization: Secondary | ICD-10-CM | POA: Diagnosis not present

## 2021-04-06 DIAGNOSIS — Z87891 Personal history of nicotine dependence: Secondary | ICD-10-CM

## 2021-04-06 DIAGNOSIS — I10 Essential (primary) hypertension: Secondary | ICD-10-CM

## 2021-04-06 DIAGNOSIS — R232 Flushing: Secondary | ICD-10-CM

## 2021-04-06 DIAGNOSIS — J40 Bronchitis, not specified as acute or chronic: Secondary | ICD-10-CM

## 2021-04-06 DIAGNOSIS — R7303 Prediabetes: Secondary | ICD-10-CM | POA: Diagnosis not present

## 2021-04-06 MED ORDER — LOSARTAN POTASSIUM 50 MG PO TABS: 50.0000 mg | ORAL_TABLET | Freq: Every day | ORAL | 0 refills | Status: DC

## 2021-04-06 MED ORDER — OMEPRAZOLE 40 MG PO CPDR: 40.0000 mg | DELAYED_RELEASE_CAPSULE | Freq: Every day | ORAL | 1 refills | Status: DC

## 2021-04-06 MED ORDER — ALBUTEROL SULFATE HFA 108 (90 BASE) MCG/ACT IN AERS: 2.0000 | INHALATION_SPRAY | Freq: Four times a day (QID) | RESPIRATORY_TRACT | 11 refills | Status: DC | PRN

## 2021-04-06 MED ORDER — ACYCLOVIR 400 MG PO TABS: ORAL_TABLET | ORAL | 1 refills | Status: DC

## 2021-04-06 MED ORDER — CITALOPRAM HYDROBROMIDE 20 MG PO TABS: ORAL_TABLET | ORAL | 1 refills | Status: DC

## 2021-04-06 NOTE — Assessment & Plan Note (Addendum)
Apparently diagnosed over ten years ago,  But untreated due to mask intolerance Discussed the long term history of OSA , the risks of long term damage to heart and the signs and symptoms attributable to OSA. She has accepted neurology referral to sleep specialist for sleep study and management . Referral to [facility] in process Prior sleep study is not in chart and patient does not remember where it was done or who ordered it.

## 2021-04-06 NOTE — Assessment & Plan Note (Signed)
Has not smoked  In 2 years

## 2021-04-06 NOTE — Assessment & Plan Note (Addendum)
She contnues to have Poor control on current regimen, despite increase in amlodipine to 10 mg by ER physiican.  Untreated OSA may also be contributed given elevated morning readings and frequent headache a/w/a history of OSA diagnosed by sleep study remotely.  Stopping Imdur.   adding losartan 50 mg daily.  Change toprol to evening with amlodipine.  Return in one week for bmet. Referral to neurology for sleep study and treatment

## 2021-04-06 NOTE — Assessment & Plan Note (Addendum)
Occurring post prandially,  With symptoms of post prandial diarrhea and tremulousness.  Glucometer given. Advised to document CBGS during symptomatic episodes ,  24 hr urine collection for 5HIAA to rule out carcinoid

## 2021-04-06 NOTE — Progress Notes (Signed)
Subjective:  Patient ID: Melanie Fisher, female    DOB: Nov 30, 1965  Age: 55 y.o. MRN: [id number]  CC: The primary encounter diagnosis was Need for immunization against influenza. Diagnoses of Bronchitis, Mild intermittent asthma with exacerbation, GERD without esophagitis, Pallor and flushing, Prediabetes, Hypoglycemia, OSA (obstructive sleep apnea), History of tobacco abuse, Essential hypertension, and Flushing reaction were also pertinent to this visit.  HPI Melanie Fisher presents for  Chief Complaint  Patient presents with   Follow-up    Follow up from ED visit on 03/28/2021. Pt went to ED for dizziness and elevated blood pressure.    This visit occurred during the SARS-CoV-2 public health emergency.  Safety protocols were in place, including screening questions prior to the visit, additional usage of staff PPE, and extensive cleaning of exam room while observing appropriate contact time as indicated for disinfecting solutions.   TREATED IN ER ON OCT 10 for one week history of intermittent vertigo and palpitations (a feeling o heart racing).  BP was elevated to systolic of 664.  Neuro exam was normal  troponins were negative x 2.  Lytes, normal  EKG non ischemic.  Amlodipine dose was increased .   HTN:   Currently taking amlodipine 10 mg qhs, metoprolol and Imdur during the day  .  Readings are elevated to 403 systolic in the morning .  Getting headaches during the day ,  sometimes in the morning.  She does take NSAIDS  (celebrex once daily) , has a remote tobacco history ,  and currently has untreated OSA diagnosed nearly ten years ago,  jntreated due to to mask intolerance despite multiple efforts to adjust.  Has never seen a sleep specialist . Discussed referral to [facility]  Obeisty and Prediabetes :  she states that she is  eating low carb  at least 75% of the time.    No sweet tea,  or sodas.  Eats protein breakfast  has been Having hypoglycemic symptoms frequently,   occurring an hour after eating lunch around 1 pm .  Symptoms persist until she eats something .  She is not fasting; eating 3 meals daily  minimum.  Walking daily but not losing weight.  Works at [employer]. Has not checked sugars during hypoglycemic episodes.  Has been having flushing symptoms rarely (4 times per month) which are always accompanied by   loose stools which occur more frequently than the flushing episodes.  The loose stools have been occurring after eating , on average 2-3 times per week      Outpatient Medications Prior to Visit  Medication Sig Dispense Refill   amLODipine (NORVASC) 10 MG tablet Take 1 tablet (10 mg total) by mouth daily. 30 tablet 0   celecoxib (CELEBREX) 200 MG capsule TAKE 1 CAPSULE(200 MG) BY MOUTH DAILY 90 capsule 0   hyoscyamine (LEVSIN) 0.125 MG tablet TAKE 1 TABLET(0.125 MG) BY MOUTH EVERY 4 HOURS AS NEEDED 30 tablet 0   hyoscyamine (LEVSIN) 0.125 MG tablet TAKE 1 TABLET(0.125 MG) BY MOUTH EVERY 4 HOURS AS NEEDED 30 tablet 0   meclizine (ANTIVERT) 25 MG tablet Take 1 tablet (25 mg total) by mouth 3 (three) times daily as needed for dizziness. 15 tablet 0   metoprolol succinate (TOPROL-XL) 50 MG 24 hr tablet TAKE 1 TABLET BY MOUTH EVERY DAY WITH OR IMMEDIATELY FOLLOWING A MEAL 90 tablet 0   traMADol (ULTRAM) 50 MG tablet Take 1 tablet (50 mg total) by mouth every 12 (twelve) hours as needed  for moderate pain. 60 tablet 2   acyclovir (ZOVIRAX) 400 MG tablet TAKE 1 TABLET BY MOUTH EVERY DAY FOR PREVENTION OR OUTBREAK 90 tablet 0   albuterol (VENTOLIN HFA) 108 (90 Base) MCG/ACT inhaler Inhale 2 puffs into the lungs every 6 (six) hours as needed for wheezing or shortness of breath. 18 g 11   citalopram (CELEXA) 20 MG tablet TAKE 1 TABLET(20 MG) BY MOUTH DAILY 90 tablet 0   isosorbide mononitrate (IMDUR) 30 MG 24 hr tablet Take 1 tablet (30 mg total) by mouth daily. 30 tablet 2   omeprazole (PRILOSEC) 40 MG capsule Take 1 capsule (40 mg total) by mouth daily.  90 capsule 0   No facility-administered medications prior to visit.    Review of Systems;  Patient denies headache, fevers, malaise, unintentional weight loss, skin rash, eye pain, sinus congestion and sinus pain, sore throat, dysphagia,  hemoptysis , cough, dyspnea, wheezing, chest pain, palpitations, orthopnea, edema, abdominal pain, nausea, melena, diarrhea, constipation, flank pain, dysuria, hematuria, urinary  Frequency, nocturia, numbness, tingling, seizures,  Focal weakness, Loss of consciousness,  Tremor, insomnia, depression, anxiety, and suicidal ideation.      Objective:  BP (!) 142/84 (BP Location: Left Arm, Patient Position: Sitting, Cuff Size: Normal)   Pulse 69   Temp (!) 96.1 F (35.6 C) (Temporal)   Ht 5\' 3"  (1.6 m)   Wt 199 lb 3.2 oz (90.4 kg)   SpO2 98%   BMI 35.29 kg/m   BP Readings from Last 3 Encounters:  04/06/21 (!) 142/84  03/28/21 (!) 161/92  12/27/20 (!) 148/89    Wt Readings from Last 3 Encounters:  04/06/21 199 lb 3.2 oz (90.4 kg)  03/28/21 190 lb (86.2 kg)  01/28/21 202 lb (91.6 kg)    General appearance: alert, cooperative and appears stated age Ears: normal TM's and external ear canals both ears Throat: lips, mucosa, and tongue normal; teeth and gums normal Neck: no adenopathy, no carotid bruit, supple, symmetrical, trachea midline and thyroid not enlarged, symmetric, no tenderness/mass/nodules Back: symmetric, no curvature. ROM normal. No CVA tenderness. Lungs: clear to auscultation bilaterally Heart: regular rate and rhythm, S1, S2 normal, no murmur, click, rub or gallop Abdomen: soft, non-tender; bowel sounds normal; no masses,  no organomegaly Pulses: 2+ and symmetric Skin: Skin color, texture, turgor normal. No rashes or lesions Lymph nodes: Cervical, supraclavicular, and axillary nodes normal.  Lab Results  Component Value Date   HGBA1C 5.8 04/06/2021   HGBA1C 6.0 12/22/2020   HGBA1C 5.2 02/21/2019    Lab Results  Component  Value Date   CREATININE 1.09 04/06/2021   CREATININE 0.97 03/28/2021   CREATININE 0.69 12/27/2020    Lab Results  Component Value Date   WBC 4.8 03/28/2021   HGB 11.5 (L) 03/28/2021   HCT 35.0 (L) 03/28/2021   PLT 195 03/28/2021   GLUCOSE 83 04/06/2021   CHOL 237 (H) 12/22/2020   TRIG 148.0 12/22/2020   HDL 56.20 12/22/2020   LDLDIRECT 131.0 05/23/2016   LDLCALC 151 (H) 12/22/2020   ALT 9 04/06/2021   AST 13 04/06/2021   NA 138 04/06/2021   K 3.9 04/06/2021   CL 105 04/06/2021   CREATININE 1.09 04/06/2021   BUN 36 (H) 04/06/2021   CO2 25 04/06/2021   TSH 4.31 12/22/2020   HGBA1C 5.8 04/06/2021   MICROALBUR 0.4 02/21/2019    No results found.  Assessment & Plan:   Problem List Items Addressed This Visit     History of tobacco  abuse    Has not smoked  In 2 years      Essential hypertension    She contnues to have Poor control on current regimen, despite increase in amlodipine to 10 mg by ER physiican.  Untreated OSA may also be contributed given elevated morning readings and frequent headache a/w/a history of OSA diagnosed by sleep study remotely.  Stopping Imdur.   adding losartan 50 mg daily.  Change toprol to evening with amlodipine.  Return in one week for bmet. Referral to neurology for sleep study and treatment       Relevant Medications   losartan (COZAAR) 50 MG tablet   Other Relevant Orders   Basic metabolic panel   Prediabetes   Relevant Orders   Hemoglobin A1c (Completed)   Comprehensive metabolic panel (Completed)   OSA (obstructive sleep apnea)    Apparently diagnosed over ten years ago,  But untreated due to mask intolerance Discussed the long term history of OSA , the risks of long term damage to heart and the signs and symptoms attributable to OSA. She has accepted neurology referral to sleep specialist for sleep study and management . Referral to [facility] in process Prior sleep study is not in chart and patient does not remember where it  was done or who ordered it.       Relevant Orders   Ambulatory referral to Neurology   Pallor and flushing    Occurring post prandially,  With symptoms of post prandial diarrhea and tremulousness.  Glucometer given. Advised to document CBGS during symptomatic episodes ,  24 hr urine collection for 5HIAA to rule out carcinoid      Relevant Medications   losartan (COZAAR) 50 MG tablet   Other Relevant Orders   5 HIAA, quantitative, urine, 24 hour   Other Visit Diagnoses     Need for immunization against influenza    -  Primary   Relevant Orders   Flu Vaccine QUAD 44mo+IM (Fluarix, Fluzone & Alfiuria Quad PF) (Completed)   Bronchitis       Relevant Medications   albuterol (VENTOLIN HFA) 108 (90 Base) MCG/ACT inhaler   Mild intermittent asthma with exacerbation       Relevant Medications   albuterol (VENTOLIN HFA) 108 (90 Base) MCG/ACT inhaler   GERD without esophagitis       Relevant Medications   omeprazole (PRILOSEC) 40 MG capsule   Hypoglycemia       Relevant Orders   Insulin, random (Completed)   Flushing reaction       Relevant Medications   losartan (COZAAR) 50 MG tablet     A total of 40 minutes was spent with patient more than half of which was spent in counseling patient on the above mentioned issues , reviewing and explaining recent labs and imaging studies done, and coordination of care.   Meds ordered this encounter  Medications   acyclovir (ZOVIRAX) 400 MG tablet    Sig: TAKE 1 TABLET BY MOUTH EVERY DAY FOR PREVENTION OR OUTBREAK    Dispense:  90 tablet    Refill:  1   albuterol (VENTOLIN HFA) 108 (90 Base) MCG/ACT inhaler    Sig: Inhale 2 puffs into the lungs every 6 (six) hours as needed for wheezing or shortness of breath.    Dispense:  18 g    Refill:  11   citalopram (CELEXA) 20 MG tablet    Sig: TAKE 1 TABLET(20 MG) BY MOUTH DAILY    Dispense:  90  tablet    Refill:  1   omeprazole (PRILOSEC) 40 MG capsule    Sig: Take 1 capsule (40 mg total) by  mouth daily.    Dispense:  90 capsule    Refill:  1   losartan (COZAAR) 50 MG tablet    Sig: Take 1 tablet (50 mg total) by mouth daily.    Dispense:  90 tablet    Refill:  0    Medications Discontinued During This Encounter  Medication Reason   albuterol (VENTOLIN HFA) 108 (90 Base) MCG/ACT inhaler Reorder   citalopram (CELEXA) 20 MG tablet Reorder   acyclovir (ZOVIRAX) 400 MG tablet Reorder   omeprazole (PRILOSEC) 40 MG capsule Reorder   isosorbide mononitrate (IMDUR) 30 MG 24 hr tablet     Follow-up: Return in about 4 weeks (around 05/04/2021).   Crecencio Mc, MD

## 2021-04-06 NOTE — Patient Instructions (Addendum)
Stop the isosorbide mononitrate.  START LOSARTAN   Take the metoprolol at night with the amlodipine  Take the new medication,  losartan  in the morning   Send me readings in one week    Referral to [facility] neurolog[facility] for sleep study   Your low blood sugars and flushing symptoms need to be worked up   This will require checking a 24 hour collection of urine for a specific hormone   We also need you to check blood sugar whenever you feel it might be low and record it on the spReadsheet,  along with a brief description of what you ate BEFORE IT HAPPENED

## 2021-04-07 LAB — COMPREHENSIVE METABOLIC PANEL
ALT: 9 U/L (ref 0–35)
AST: 13 U/L (ref 0–37)
Albumin: 4.7 g/dL (ref 3.5–5.2)
Alkaline Phosphatase: 59 U/L (ref 39–117)
BUN: 36 mg/dL — ABNORMAL HIGH (ref 6–23)
CO2: 25 mEq/L (ref 19–32)
Calcium: 9.6 mg/dL (ref 8.4–10.5)
Chloride: 105 mEq/L (ref 96–112)
Creatinine, Ser: 1.09 mg/dL (ref 0.40–1.20)
GFR: 56.73 mL/min — ABNORMAL LOW (ref >60.00)
Glucose, Bld: 83 mg/dL (ref 70–99)
Potassium: 3.9 mEq/L (ref 3.5–5.1)
Sodium: 138 mEq/L (ref 135–145)
Total Bilirubin: 0.4 mg/dL (ref 0.2–1.2)
Total Protein: 7.7 g/dL (ref 6.0–8.3)

## 2021-04-07 LAB — HEMOGLOBIN A1C: Hgb A1c MFr Bld: 5.8 % (ref 4.6–6.5)

## 2021-04-07 LAB — INSULIN, RANDOM: Insulin: 3.3 u[IU]/mL

## 2021-04-09 NOTE — Assessment & Plan Note (Signed)
Accompanied by post prandial diarrhea,  Elevated blood pressure. Not clear if symptoms are due to menopause or something more serious.  Ordering 24 hour urine collection for 5 hydroxy indoleacetic acid (5 HIAA) to rule out carcinoid tumor .   Last colonoscopy was 2019  With 5 yr follow up planned

## 2021-04-11 DIAGNOSIS — R231 Pallor: Secondary | ICD-10-CM | POA: Diagnosis not present

## 2021-04-11 DIAGNOSIS — R232 Flushing: Secondary | ICD-10-CM | POA: Diagnosis not present

## 2021-04-11 NOTE — Addendum Note (Signed)
Addended by: Leeanne Rio on: 04/11/2021 04:55 PM   Modules accepted: Orders

## 2021-04-18 LAB — 5 HIAA, QUANTITATIVE, URINE, 24 HOUR
5 HIAA, 24 Hour Urine: 1.8 mg/24 h (ref <=6.0)
Total Volume: 550 mL

## 2021-04-20 ENCOUNTER — Other Ambulatory Visit: Payer: Self-pay | Admitting: Internal Medicine

## 2021-05-06 ENCOUNTER — Other Ambulatory Visit: Payer: Self-pay

## 2021-05-06 ENCOUNTER — Ambulatory Visit: Payer: 59 | Admitting: Internal Medicine

## 2021-05-06 ENCOUNTER — Encounter: Payer: Self-pay | Admitting: Internal Medicine

## 2021-05-06 DIAGNOSIS — I10 Essential (primary) hypertension: Secondary | ICD-10-CM

## 2021-05-06 DIAGNOSIS — J0100 Acute maxillary sinusitis, unspecified: Secondary | ICD-10-CM | POA: Diagnosis not present

## 2021-05-06 MED ORDER — PREDNISONE 10 MG PO TABS: ORAL_TABLET | ORAL | 0 refills | Status: DC

## 2021-05-06 MED ORDER — LOSARTAN POTASSIUM 100 MG PO TABS: 100.0000 mg | ORAL_TABLET | Freq: Every day | ORAL | 0 refills | Status: DC

## 2021-05-06 MED ORDER — AMOXICILLIN-POT CLAVULANATE 875-125 MG PO TABS: 1.0000 | ORAL_TABLET | Freq: Two times a day (BID) | ORAL | 0 refills | Status: DC

## 2021-05-06 MED ORDER — CHERATUSSIN AC 100-10 MG/5ML PO SOLN: 5.0000 mL | Freq: Three times a day (TID) | ORAL | 0 refills | Status: DC | PRN

## 2021-05-06 NOTE — Progress Notes (Signed)
Subjective:  Patient ID: Melanie Fisher, female    DOB: May 11, 1966  Age: 55 y.o. MRN: [id number]  CC: Diagnoses of Acute non-recurrent maxillary sinusitis and Essential hypertension were pertinent to this visit.  HPI Melanie Fisher presents for  Chief Complaint  Patient presents with   Follow-up    Follow up on hypertension    This visit occurred during the SARS-CoV-2 public health emergency.  Safety protocols were in place, including screening questions prior to the visit, additional usage of staff PPE, and extensive cleaning of exam room while observing appropriate contact time as indicated for disinfecting solutions.    Patient has had a cough since Tuesday after sleeping with the windows open . Denies fevers,  body aches and sore throat.  [facility] TEST ON Monday .  TAKING ROBITUSSIN . Some wheezing, using albuterol inhaler,  and sinus drainage has been blood streaked.   Patient is taking her medications as prescribed and notes no adverse effects.  Home BP readings have been done 5 times per week and have ranged from 409 systolic to 735 systolic,  taking losartan 50 mg and metoprolol 50 mg AT NIGHT,   and amlodipine 10 mg daily IN THE AM .  HEADACHES AND PALPITATIONS HAVE RESOLVED.  Most of the elevated readings are in the am and the lowest readings are in the afternoon   She is avoiding added salt in her diet and walking regularly about 3 times per week for exercise  .   Outpatient Medications Prior to Visit  Medication Sig Dispense Refill   acyclovir (ZOVIRAX) 400 MG tablet TAKE 1 TABLET BY MOUTH EVERY DAY FOR PREVENTION OR OUTBREAK 90 tablet 1   albuterol (VENTOLIN HFA) 108 (90 Base) MCG/ACT inhaler Inhale 2 puffs into the lungs every 6 (six) hours as needed for wheezing or shortness of breath. 18 g 11   celecoxib (CELEBREX) 200 MG capsule TAKE 1 CAPSULE(200 MG) BY MOUTH DAILY 90 capsule 0   citalopram (CELEXA) 20 MG tablet TAKE 1 TABLET(20 MG) BY MOUTH DAILY 90  tablet 1   losartan (COZAAR) 50 MG tablet Take 1 tablet (50 mg total) by mouth daily. 90 tablet 0   meclizine (ANTIVERT) 25 MG tablet Take 1 tablet (25 mg total) by mouth 3 (three) times daily as needed for dizziness. 15 tablet 0   metoprolol succinate (TOPROL-XL) 50 MG 24 hr tablet TAKE 1 TABLET BY MOUTH EVERY DAY WITH OR IMMEDIATELY FOLLOWING A MEAL 90 tablet 0   omeprazole (PRILOSEC) 40 MG capsule Take 1 capsule (40 mg total) by mouth daily. 90 capsule 1   traMADol (ULTRAM) 50 MG tablet Take 1 tablet (50 mg total) by mouth every 12 (twelve) hours as needed for moderate pain. 60 tablet 2   amLODipine (NORVASC) 10 MG tablet Take 1 tablet (10 mg total) by mouth daily. 30 tablet 0   hyoscyamine (LEVSIN) 0.125 MG tablet TAKE 1 TABLET(0.125 MG) BY MOUTH EVERY 4 HOURS AS NEEDED (Patient not taking: Reported on 05/06/2021) 30 tablet 0   hyoscyamine (LEVSIN) 0.125 MG tablet TAKE 1 TABLET(0.125 MG) BY MOUTH EVERY 4 HOURS AS NEEDED (Patient not taking: Reported on 05/06/2021) 30 tablet 0   No facility-administered medications prior to visit.    Review of Systems;  Patient denies headache, fevers, malaise, unintentional weight loss, skin rash, eye pain, sinus congestion and sinus pain, sore throat, dysphagia,  hemoptysis , cough, dyspnea, wheezing, chest pain, palpitations, orthopnea, edema, abdominal pain, nausea, melena,  diarrhea, constipation, flank pain, dysuria, hematuria, urinary  Frequency, nocturia, numbness, tingling, seizures,  Focal weakness, Loss of consciousness,  Tremor, insomnia, depression, anxiety, and suicidal ideation.      Objective:  BP 130/74 (BP Location: Left Arm, Patient Position: Sitting, Cuff Size: Normal)   Pulse 68   Temp (!) 96.3 F (35.7 C) (Temporal)   Ht 5\' 3"  (1.6 m)   Wt 202 lb 9.6 oz (91.9 kg)   SpO2 98%   BMI 35.89 kg/m   BP Readings from Last 3 Encounters:  05/06/21 130/74  04/06/21 (!) 142/84  03/28/21 (!) 161/92    Wt Readings from Last 3 Encounters:   05/06/21 202 lb 9.6 oz (91.9 kg)  04/06/21 199 lb 3.2 oz (90.4 kg)  03/28/21 190 lb (86.2 kg)    General appearance: alert, cooperative and appears stated age Ears: normal TM's and external ear canals both ears Throat: lips, mucosa, and tongue normal; teeth and gums normal Neck: no adenopathy, no carotid bruit, supple, symmetrical, trachea midline and thyroid not enlarged, symmetric, no tenderness/mass/nodules Back: symmetric, no curvature. ROM normal. No CVA tenderness. Lungs: clear to auscultation bilaterally Heart: regular rate and rhythm, S1, S2 normal, no murmur, click, rub or gallop Abdomen: soft, non-tender; bowel sounds normal; no masses,  no organomegaly Pulses: 2+ and symmetric Skin: Skin color, texture, turgor normal. No rashes or lesions Lymph nodes: Cervical, supraclavicular, and axillary nodes normal.  Lab Results  Component Value Date   HGBA1C 5.8 04/06/2021   HGBA1C 6.0 12/22/2020   HGBA1C 5.2 02/21/2019    Lab Results  Component Value Date   CREATININE 1.09 04/06/2021   CREATININE 0.97 03/28/2021   CREATININE 0.69 12/27/2020    Lab Results  Component Value Date   WBC 4.8 03/28/2021   HGB 11.5 (L) 03/28/2021   HCT 35.0 (L) 03/28/2021   PLT 195 03/28/2021   GLUCOSE 83 04/06/2021   CHOL 237 (H) 12/22/2020   TRIG 148.0 12/22/2020   HDL 56.20 12/22/2020   LDLDIRECT 131.0 05/23/2016   LDLCALC 151 (H) 12/22/2020   ALT 9 04/06/2021   AST 13 04/06/2021   NA 138 04/06/2021   K 3.9 04/06/2021   CL 105 04/06/2021   CREATININE 1.09 04/06/2021   BUN 36 (H) 04/06/2021   CO2 25 04/06/2021   TSH 4.31 12/22/2020   HGBA1C 5.8 04/06/2021   MICROALBUR 0.4 02/21/2019    No results found.  Assessment & Plan:   Problem List Items Addressed This Visit     Essential hypertension    Not at goal yet.  Advised to increase losartan dose to 100 mg daily ,  Continue metoprolol xl 50 mg and amlodipine 10 mg       Acute sinusitis    Augmentin, prednisone and  cheratussin sent.  covid negative by home test. No flu symptoms       Relevant Medications   guaiFENesin-codeine (CHERATUSSIN AC) 100-10 MG/5ML syrup   amoxicillin-clavulanate (AUGMENTIN) 875-125 MG tablet   predniSONE (DELTASONE) 10 MG tablet    I have discontinued Mardene Celeste L. Lanney Gins Taves"'s hyoscyamine and hyoscyamine. I am also having her start on Cheratussin AC, amoxicillin-clavulanate, and predniSONE. Additionally, I am having her maintain her traMADol, metoprolol succinate, celecoxib, amLODipine, meclizine, acyclovir, albuterol, citalopram, omeprazole, and losartan.  Meds ordered this encounter  Medications   guaiFENesin-codeine (CHERATUSSIN AC) 100-10 MG/5ML syrup    Sig: Take 5 mLs by mouth 3 (three) times daily as needed for cough.    Dispense:  120 mL  Refill:  0   amoxicillin-clavulanate (AUGMENTIN) 875-125 MG tablet    Sig: Take 1 tablet by mouth 2 (two) times daily.    Dispense:  14 tablet    Refill:  0   predniSONE (DELTASONE) 10 MG tablet    Sig: 6 tablets on Day 1 , then reduce by 1 tablet daily until gone    Dispense:  21 tablet    Refill:  0    Medications Discontinued During This Encounter  Medication Reason   hyoscyamine (LEVSIN) 0.125 MG tablet    hyoscyamine (LEVSIN) 0.125 MG tablet     Follow-up: No follow-ups on file.   Crecencio Mc, MD

## 2021-05-06 NOTE — Patient Instructions (Signed)
INCREASE THE LOSARTAN TO 100 MG IN THE EVENING FOR BLOOD PRESSURE GOAL OF 120/70   CONTINUE  METOPROLOL  50 MG IN THE EVENING  CONTINUE AMLODIPINE 10 MG IN THE MORNING     FOR THE SINUS INFECTION:   AUGMENTIN TWICE DAILY FOR 7 DAYS  PLUS A PROBIOTIC  CHERATUSSIN FOR THE COUGH    PREDNISONE TAPER FOR THE WHEEZING

## 2021-05-06 NOTE — Assessment & Plan Note (Signed)
Augmentin, prednisone and cheratussin sent.  covid negative by home test. No flu symptoms

## 2021-05-06 NOTE — Assessment & Plan Note (Signed)
Not at goal yet.  Advised to increase losartan dose to 100 mg daily ,  Continue metoprolol xl 50 mg and amlodipine 10 mg

## 2021-05-09 ENCOUNTER — Telehealth: Payer: Self-pay | Admitting: Internal Medicine

## 2021-05-09 NOTE — Telephone Encounter (Signed)
Pt called in stating that her pharmacy doesn't have medication (guaiFENesin-codeine (CHERATUSSIN AC) 100-10 MG/5ML syrup) in stock. Pt would like another prescription to be sent over to [facility] on [address] [place], [place]. Pt would like callback

## 2021-05-10 ENCOUNTER — Other Ambulatory Visit: Payer: Self-pay | Admitting: Internal Medicine

## 2021-05-10 MED ORDER — CHERATUSSIN AC 100-10 MG/5ML PO SOLN: 5.0000 mL | Freq: Three times a day (TID) | ORAL | 0 refills | Status: DC | PRN

## 2021-05-10 NOTE — Telephone Encounter (Signed)
Cheratussin ac sent to [facility] per request

## 2021-05-12 ENCOUNTER — Other Ambulatory Visit: Payer: Self-pay | Admitting: Internal Medicine

## 2021-05-12 DIAGNOSIS — I10 Essential (primary) hypertension: Secondary | ICD-10-CM

## 2021-05-18 ENCOUNTER — Other Ambulatory Visit: Payer: Self-pay | Admitting: Internal Medicine

## 2021-05-28 ENCOUNTER — Other Ambulatory Visit: Payer: Self-pay | Admitting: Internal Medicine

## 2021-05-28 DIAGNOSIS — K219 Gastro-esophageal reflux disease without esophagitis: Secondary | ICD-10-CM

## 2021-06-27 ENCOUNTER — Other Ambulatory Visit: Payer: Self-pay

## 2021-06-27 ENCOUNTER — Encounter: Payer: Self-pay | Admitting: Internal Medicine

## 2021-06-27 ENCOUNTER — Ambulatory Visit: Payer: 59 | Admitting: Internal Medicine

## 2021-06-27 VITALS — BP 112/70 | HR 74 | Temp 98.5°F | Ht 63.0 in | Wt 200.8 lb

## 2021-06-27 DIAGNOSIS — R7303 Prediabetes: Secondary | ICD-10-CM | POA: Diagnosis not present

## 2021-06-27 DIAGNOSIS — Z1231 Encounter for screening mammogram for malignant neoplasm of breast: Secondary | ICD-10-CM | POA: Diagnosis not present

## 2021-06-27 DIAGNOSIS — F418 Other specified anxiety disorders: Secondary | ICD-10-CM

## 2021-06-27 DIAGNOSIS — R232 Flushing: Secondary | ICD-10-CM

## 2021-06-27 DIAGNOSIS — I10 Essential (primary) hypertension: Secondary | ICD-10-CM | POA: Diagnosis not present

## 2021-06-27 DIAGNOSIS — G4733 Obstructive sleep apnea (adult) (pediatric): Secondary | ICD-10-CM | POA: Diagnosis not present

## 2021-06-27 DIAGNOSIS — J439 Emphysema, unspecified: Secondary | ICD-10-CM | POA: Diagnosis not present

## 2021-06-27 DIAGNOSIS — R231 Pallor: Secondary | ICD-10-CM

## 2021-06-27 DIAGNOSIS — R69 Illness, unspecified: Secondary | ICD-10-CM | POA: Diagnosis not present

## 2021-06-27 NOTE — Assessment & Plan Note (Signed)
SYMPTOMS controlled with citalopram.  No changes today

## 2021-06-27 NOTE — Patient Instructions (Addendum)
Change the metoprolol dosing to nighttime and ctinue amldipine at night as well  Continue losartan in the morning   The blood pressure may be elevated in the morning due to untreated sleep apnea  Referring you to [facility] for management  of OSA and COPD  Return in April for fasting labs PRIOR to CPE WITH PAP SMEAR

## 2021-06-27 NOTE — Progress Notes (Signed)
Subjective:  Patient ID: Melanie Fisher, female    DOB: 05-05-1966  Age: 56 y.o. MRN: [id number]  CC: The primary encounter diagnosis was OSA (obstructive sleep apnea). Diagnoses of Pulmonary emphysema, unspecified emphysema type ([place]), Prediabetes, Hypertension, unspecified type, Encounter for screening mammogram for malignant neoplasm of breast, Depression with anxiety, and Pallor and flushing were also pertinent to this visit.   This visit occurred during the SARS-CoV-2 public health emergency.  Safety protocols were in place, including screening questions prior to the visit, additional usage of staff PPE, and extensive cleaning of exam room while observing appropriate contact time as indicated for disinfecting solutions.    HPI Melanie Fisher presents for  Chief Complaint  Patient presents with   Follow-up    6 month follow up on hypertension, and medication refills   Hypertension: patient checks blood pressure twice weekly at home.  Readings have been for the most part > 140/80 in the morning and better during the day  . Patient is following a reduce salt diet most days and is taking medications as prescribed   Low back pain : using tramadol sparingly , only gets 14 pills at at time due to inconsistent use.  Aggravated by sitting certain ways and sleeping certain ways.  Has had to start lifting heavy boxes sometimesfrom the ground, at work. 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  The boxes can weigh as much as 50 lb s   Outpatient Medications Prior to Visit  Medication Sig Dispense Refill   acyclovir (ZOVIRAX) 400 MG tablet TAKE 1 TABLET BY MOUTH EVERY DAY FOR PREVENTION OR OUTBREAK 90 tablet 1   albuterol (VENTOLIN HFA) 108 (90 Base) MCG/ACT inhaler Inhale 2 puffs into the lungs every 6 (six) hours as needed for wheezing or shortness of breath. 18 g 11   amLODipine (NORVASC) 10 MG tablet Take 1 tablet (10 mg total) by mouth daily. 30 tablet 0   celecoxib (CELEBREX) 200 MG  capsule TAKE 1 CAPSULE(200 MG) BY MOUTH DAILY 90 capsule 0   citalopram (CELEXA) 20 MG tablet TAKE 1 TABLET(20 MG) BY MOUTH DAILY 90 tablet 1   losartan (COZAAR) 100 MG tablet Take 1 tablet (100 mg total) by mouth daily. 30 tablet 0   metoprolol succinate (TOPROL-XL) 50 MG 24 hr tablet TAKE 1 TABLET BY MOUTH EVERY DAY WITH OR IMMEDIATELY FOLLOWING A MEAL 90 tablet 0   omeprazole (PRILOSEC) 40 MG capsule TAKE 1 CAPSULE(40 MG) BY MOUTH DAILY 90 capsule 1   traMADol (ULTRAM) 50 MG tablet Take 1 tablet (50 mg total) by mouth every 12 (twelve) hours as needed for moderate pain. 60 tablet 2   predniSONE (DELTASONE) 10 MG tablet 6 tablets on Day 1 , then reduce by 1 tablet daily until gone 21 tablet 0   amoxicillin-clavulanate (AUGMENTIN) 875-125 MG tablet Take 1 tablet by mouth 2 (two) times daily. (Patient not taking: Reported on 06/27/2021) 14 tablet 0   guaiFENesin-codeine (CHERATUSSIN AC) 100-10 MG/5ML syrup Take 5 mLs by mouth 3 (three) times daily as needed for cough. (Patient not taking: Reported on 06/27/2021) 120 mL 0   meclizine (ANTIVERT) 25 MG tablet Take 1 tablet (25 mg total) by mouth 3 (three) times daily as needed for dizziness. (Patient not taking: Reported on 06/27/2021) 15 tablet 0   No facility-administered medications prior to visit.    Review of Systems;  Patient denies headache, fevers, malaise, unintentional weight loss, skin rash, eye pain, sinus congestion and sinus pain, sore  throat, dysphagia,  hemoptysis , cough, dyspnea, wheezing, chest pain, palpitations, orthopnea, edema, abdominal pain, nausea, melena, diarrhea, constipation, flank pain, dysuria, hematuria, urinary  Frequency, nocturia, numbness, tingling, seizures,  Focal weakness, Loss of consciousness,  Tremor, insomnia, depression, anxiety, and suicidal ideation.      Objective:  BP 112/70 (BP Location: Left Arm, Patient Position: Sitting, Cuff Size: Normal)    Pulse 74    Temp 98.5 F (36.9 C) (Oral)    Ht 5\' 3"  (1.6  m)    Wt 200 lb 12.8 oz (91.1 kg)    SpO2 97%    BMI 35.57 kg/m   BP Readings from Last 3 Encounters:  06/27/21 112/70  05/06/21 130/74  04/06/21 (!) 142/84    Wt Readings from Last 3 Encounters:  06/27/21 200 lb 12.8 oz (91.1 kg)  05/06/21 202 lb 9.6 oz (91.9 kg)  04/06/21 199 lb 3.2 oz (90.4 kg)    General appearance: alert, cooperative and appears stated age Ears: normal TM's and external ear canals both ears Throat: lips, mucosa, and tongue normal; teeth and gums normal Neck: no adenopathy, no carotid bruit, supple, symmetrical, trachea midline and thyroid not enlarged, symmetric, no tenderness/mass/nodules Back: symmetric, no curvature. ROM normal. No CVA tenderness. Lungs: clear to auscultation bilaterally Heart: regular rate and rhythm, S1, S2 normal, no murmur, click, rub or gallop Abdomen: soft, non-tender; bowel sounds normal; no masses,  no organomegaly Pulses: 2+ and symmetric Skin: Skin color, texture, turgor normal. No rashes or lesions Lymph nodes: Cervical, supraclavicular, and axillary nodes normal.  Lab Results  Component Value Date   HGBA1C 5.8 04/06/2021   HGBA1C 6.0 12/22/2020   HGBA1C 5.2 02/21/2019    Lab Results  Component Value Date   CREATININE 1.09 04/06/2021   CREATININE 0.97 03/28/2021   CREATININE 0.69 12/27/2020    Lab Results  Component Value Date   WBC 4.8 03/28/2021   HGB 11.5 (L) 03/28/2021   HCT 35.0 (L) 03/28/2021   PLT 195 03/28/2021   GLUCOSE 83 04/06/2021   CHOL 237 (H) 12/22/2020   TRIG 148.0 12/22/2020   HDL 56.20 12/22/2020   LDLDIRECT 131.0 05/23/2016   LDLCALC 151 (H) 12/22/2020   ALT 9 04/06/2021   AST 13 04/06/2021   NA 138 04/06/2021   K 3.9 04/06/2021   CL 105 04/06/2021   CREATININE 1.09 04/06/2021   BUN 36 (H) 04/06/2021   CO2 25 04/06/2021   TSH 4.31 12/22/2020   HGBA1C 5.8 04/06/2021   MICROALBUR 0.4 02/21/2019    No results found.  Assessment & Plan:   Problem List Items Addressed This Visit      Depression with anxiety    SYMPTOMS controlled with citalopram.  No changes today       COPD (chronic obstructive pulmonary disease) ([place])    Secondary to History of  tobacco abuse .PFTS done remotely.  Not symptomatic with ADLs.  Referring to [facility] for follow up and mANAGEMENT      Relevant Orders   Ambulatory referral to Pulmonology   Prediabetes    She is at risk for Type 2 DM given her A1c , obesity and lack of sedentary lifestyle.       Relevant Orders   Hemoglobin A1c   OSA (obstructive sleep apnea) - Primary    Untreated for years due to mask intolerance.  Likely the cause of morning hypertension.  Referral to pulmonology for management       Relevant Orders   Ambulatory referral  to Pulmonology   Pallor and flushing    24 hr urine collection and chest x ray were normal.  Menopause       Other Visit Diagnoses     Hypertension, unspecified type       Relevant Orders   Comprehensive metabolic panel   Lipid panel   Encounter for screening mammogram for malignant neoplasm of breast       Relevant Orders   MM 3D SCREEN BREAST BILATERAL       I have discontinued Melanie Fisher"'s meclizine, amoxicillin-clavulanate, predniSONE, and Cheratussin AC. I am also having her maintain her traMADol, amLODipine, acyclovir, albuterol, citalopram, losartan, metoprolol succinate, celecoxib, and omeprazole.  No orders of the defined types were placed in this encounter.    I provided  30 minutes of  face-to-face time during this encounter reviewing patient's current problems and past surgeries, labs and imaging studies, providing counseling on the above mentioned problems , and coordination  of care .   Follow-up: Return in about 3 months (around 09/25/2021).   Crecencio Mc, MD

## 2021-06-27 NOTE — Assessment & Plan Note (Signed)
She is at risk for Type 2 DM given her A1c , obesity and lack of sedentary lifestyle.

## 2021-06-27 NOTE — Assessment & Plan Note (Signed)
24 hr urine collection and chest x ray were normal.  Menopause

## 2021-06-27 NOTE — Assessment & Plan Note (Signed)
Untreated for years due to mask intolerance.  Likely the cause of morning hypertension.  Referral to pulmonology for management

## 2021-06-27 NOTE — Assessment & Plan Note (Addendum)
Secondary to History of  tobacco abuse .PFTS done remotely.  Not symptomatic with ADLs.  Referring to [facility] pul[facility]ology for follow up and mANAGEMENT

## 2021-06-28 ENCOUNTER — Encounter: Payer: Self-pay | Admitting: Internal Medicine

## 2021-06-28 ENCOUNTER — Other Ambulatory Visit: Payer: Self-pay | Admitting: Internal Medicine

## 2021-06-29 ENCOUNTER — Other Ambulatory Visit: Payer: Self-pay

## 2021-06-29 MED ORDER — LOSARTAN POTASSIUM 100 MG PO TABS: 100.0000 mg | ORAL_TABLET | Freq: Every day | ORAL | 1 refills | Status: DC

## 2021-06-29 NOTE — Telephone Encounter (Signed)
Pt called in stating that the pharmacy never received the script for Pt medication (traMADol (ULTRAM) 50 MG tablet). Pt requesting refill on medication. Pt requesting callback

## 2021-06-29 NOTE — Telephone Encounter (Signed)
Patient called about the MyChart message sent yesterday for her refills on losartan (COZAAR) 100 MG tablet and traMADol (ULTRAM) 50 MG tablet

## 2021-06-29 NOTE — Telephone Encounter (Signed)
Refilled: 12/22/2020 Last OV: 06/27/2021 Next OV: 09/26/2021

## 2021-07-19 ENCOUNTER — Institutional Professional Consult (permissible substitution): Payer: 59 | Admitting: Adult Health

## 2021-07-20 ENCOUNTER — Telehealth: Payer: Self-pay | Admitting: Internal Medicine

## 2021-07-20 MED ORDER — AMLODIPINE BESYLATE 10 MG PO TABS: 10.0000 mg | ORAL_TABLET | Freq: Every day | ORAL | 0 refills | Status: DC

## 2021-07-20 NOTE — Telephone Encounter (Signed)
Patient called in requesting a refill for Amlodipine 10 MG to be sent to [facility] in [place] . Patient is completely out .

## 2021-07-20 NOTE — Telephone Encounter (Signed)
Medication has been refilled. Pt is aware.  

## 2021-08-09 ENCOUNTER — Ambulatory Visit
Admission: RE | Admit: 2021-08-09 | Discharge: 2021-08-09 | Disposition: A | Discharge status: Home / Self Care | Payer: 59 | Source: Ambulatory Visit | Attending: Internal Medicine | Admitting: Internal Medicine

## 2021-08-09 ENCOUNTER — Other Ambulatory Visit: Payer: Self-pay

## 2021-08-09 DIAGNOSIS — Z1231 Encounter for screening mammogram for malignant neoplasm of breast: Secondary | ICD-10-CM | POA: Insufficient documentation

## 2021-08-11 ENCOUNTER — Other Ambulatory Visit: Payer: Self-pay | Admitting: Internal Medicine

## 2021-08-11 DIAGNOSIS — I10 Essential (primary) hypertension: Secondary | ICD-10-CM

## 2021-09-12 ENCOUNTER — Telehealth (INDEPENDENT_AMBULATORY_CARE_PROVIDER_SITE_OTHER): Payer: 59 | Admitting: Internal Medicine

## 2021-09-12 ENCOUNTER — Encounter: Payer: Self-pay | Admitting: Internal Medicine

## 2021-09-12 DIAGNOSIS — J0130 Acute sphenoidal sinusitis, unspecified: Secondary | ICD-10-CM

## 2021-09-12 MED ORDER — AMOXICILLIN-POT CLAVULANATE 875-125 MG PO TABS: 1.0000 | ORAL_TABLET | Freq: Two times a day (BID) | ORAL | 0 refills | Status: DC

## 2021-09-12 MED ORDER — MUPIROCIN 2 % EX OINT: 1.0000 "application " | TOPICAL_OINTMENT | Freq: Two times a day (BID) | CUTANEOUS | 0 refills | Status: DC

## 2021-09-12 MED ORDER — PREDNISONE 10 MG PO TABS: ORAL_TABLET | ORAL | 0 refills | Status: DC

## 2021-09-12 NOTE — Assessment & Plan Note (Signed)
prednisone taper,  Add abx if symptoms do not improve in 3 days.  Continue antihistamine ?

## 2021-09-12 NOTE — Progress Notes (Signed)
Virtual Visit via Caregility Note ? ?This visit type was conducted due to national recommendations for restrictions regarding the COVID-19 pandemic (e.g. social distancing).  This format is felt to be most appropriate for this patient at this time.  All issues noted in this document were discussed and addressed.  No physical exam was performed (except for noted visual exam findings with Video Visits).  ? ?I connected withNAME@ on 09/12/21 at  4:30 PM EDT by a video enabled telemedicine application and verified that I am speaking with the correct person using two identifiers. ?Location patient: home ?Location provider: work or home office ?Persons participating in the virtual visit: patient, provider ? ?I discussed the limitations, risks, security and privacy concerns of performing an evaluation and management service by telephone and the availability of in person appointments. I also discussed with the patient that there may be a patient responsible charge related to this service. The patient expressed understanding and agreed to proceed. ? ?Reason for visit: sinusitis ? ?HPI: ? ?56 yr old ex smoker presents with one week history of rhinitis, rhinorrhea and productive cough .  No fivers,, but having sinus pressure and ear pressure . Had to leave work early today due to feeling poorly.  ? ?ROS: See pertinent positives and negatives per HPI. ? ?Past Medical History:  ?Diagnosis Date  ? Allergy   ? Anxiety   ? Asthma   ? Depression   ? GERD (gastroesophageal reflux disease)   ? Heart murmur   ? Hyperlipidemia   ? Hypertension   ? Sleep apnea   ? Tobacco abuse   ? ? ?Past Surgical History:  ?Procedure Laterality Date  ? ABDOMINAL HYSTERECTOMY  2008  ? partial  ? COLONOSCOPY WITH PROPOFOL N/A 06/04/2018  ? Procedure: COLONOSCOPY WITH PROPOFOL;  Surgeon: Jonathon Bellows, MD;  Location: [facility] ENDOSCOPY;  Service: Gastroenterology;  Laterality: N/A;  ? right toe bunionectomy    ? right wrist ganglion cyst removal    ? ? ?Family  History  ?Problem Relation Age of Onset  ? Kidney disease Mother 85  ?     ESKD, partial neprhectomy  ? COPD Mother   ? Heart disease Father   ?     CABG at age 65  ? Alcohol abuse Father   ?     until late 81  ? Diabetes Father   ? Hypertension Father   ? Heart disease Sister 50  ?     first MI at age 31  ? Diabetes Sister   ? Cancer Sister   ?     uterine ca  ? Stroke Brother 43  ? Cancer Maternal Uncle 60  ?     stage 4 colon CA  ? Colon cancer Maternal Uncle   ? Esophageal cancer Neg Hx   ? Pancreatic cancer Neg Hx   ? Rectal cancer Neg Hx   ? Stomach cancer Neg Hx   ? Breast cancer Neg Hx   ? ? ?SOCIAL HX:  reports that she quit smoking about 12 months ago. Her smoking use included cigarettes. She has a 10.50 pack-year smoking history. She has never used smokeless tobacco. She reports that she does not currently use alcohol. She reports that she does not use drugs.  ? ? ?Current Outpatient Medications:  ?  acyclovir (ZOVIRAX) 400 MG tablet, TAKE 1 TABLET BY MOUTH EVERY DAY FOR PREVENTION OR OUTBREAK, Disp: 90 tablet, Rfl: 1 ?  albuterol (VENTOLIN HFA) 108 (90 Base) MCG/ACT inhaler, Inhale 2  puffs into the lungs every 6 (six) hours as needed for wheezing or shortness of breath., Disp: 18 g, Rfl: 11 ?  amLODipine (NORVASC) 10 MG tablet, Take 1 tablet (10 mg total) by mouth daily., Disp: 90 tablet, Rfl: 0 ?  celecoxib (CELEBREX) 200 MG capsule, TAKE 1 CAPSULE(200 MG) BY MOUTH DAILY, Disp: 90 capsule, Rfl: 0 ?  citalopram (CELEXA) 20 MG tablet, TAKE 1 TABLET(20 MG) BY MOUTH DAILY, Disp: 90 tablet, Rfl: 1 ?  losartan (COZAAR) 100 MG tablet, Take 1 tablet (100 mg total) by mouth daily., Disp: 90 tablet, Rfl: 1 ?  metoprolol succinate (TOPROL-XL) 50 MG 24 hr tablet, TAKE 1 TABLET BY MOUTH EVERY DAY WITH OR IMMEDIATELY FOLLOWING A MEAL, Disp: 90 tablet, Rfl: 0 ?  omeprazole (PRILOSEC) 40 MG capsule, TAKE 1 CAPSULE(40 MG) BY MOUTH DAILY, Disp: 90 capsule, Rfl: 1 ?  traMADol (ULTRAM) 50 MG tablet, TAKE 1 TABLET(50 MG) BY  MOUTH EVERY 12 HOURS AS NEEDED FOR MODERATE PAIN, Disp: 60 tablet, Rfl: 5 ? ?EXAM: ? ?VITALS per patient if applicable: ? ?GENERAL: alert, oriented, appears well and in no acute distress ? ?HEENT: atraumatic, conjunttiva clear, no obvious abnormalities on inspection of external nose and ears ? ?NECK: normal movements of the head and neck ? ?LUNGS: on inspection no signs of respiratory distress, breathing rate appears normal, no obvious gross SOB, gasping or wheezing ? ?CV: no obvious cyanosis ? ?MS: moves all visible extremities without noticeable abnormality ? ?PSYCH/NEURO: pleasant and cooperative, no obvious depression or anxiety, speech and thought processing grossly intact ? ?ASSESSMENT AND PLAN: ? ?Discussed the following assessment and plan: ? ?No diagnosis found. ? ?No problem-specific Assessment & Plan notes found for this encounter. ? ?  ?I discussed the assessment and treatment plan with the patient. The patient was provided an opportunity to ask questions and all were answered. The patient agreed with the plan and demonstrated an understanding of the instructions. ?  ?The patient was advised to call back or seek an in-person evaluation if the symptoms worsen or if the condition fails to improve as anticipated. ? ? ?I spent 30 minutes dedicated to the care of this patient on the date of this encounter to include pre-visit review of his medical history,  Face-to-face time with the patient , and post visit ordering of testing and therapeutics.  ? ? ?Crecencio Mc, MD   ?

## 2021-09-12 NOTE — Patient Instructions (Signed)
Start the prednisone taper as soon as possible ? ?Increase your claritin dosing to every 12 hours for the pollen season ? ?Continue nyquil for the cough ? ?Start the augmentin in 3 days if no better,  immediately if you develop ear pain or sinus pain  ? ? ?Mupirocin antibiotic ointment : apply twice daily with a q tip the "bump" inside your nose  ?

## 2021-09-26 ENCOUNTER — Ambulatory Visit (INDEPENDENT_AMBULATORY_CARE_PROVIDER_SITE_OTHER): Payer: 59 | Admitting: Internal Medicine

## 2021-09-26 ENCOUNTER — Other Ambulatory Visit (HOSPITAL_COMMUNITY)
Admission: RE | Admit: 2021-09-26 | Discharge: 2021-09-26 | Disposition: A | Discharge status: Home / Self Care | Payer: 59 | Source: Ambulatory Visit | Attending: Internal Medicine | Admitting: Internal Medicine

## 2021-09-26 ENCOUNTER — Encounter: Payer: Self-pay | Admitting: Internal Medicine

## 2021-09-26 VITALS — BP 128/82 | HR 58 | Temp 97.8°F | Ht 63.0 in | Wt 204.5 lb

## 2021-09-26 DIAGNOSIS — J301 Allergic rhinitis due to pollen: Secondary | ICD-10-CM

## 2021-09-26 DIAGNOSIS — I10 Essential (primary) hypertension: Secondary | ICD-10-CM

## 2021-09-26 DIAGNOSIS — R7303 Prediabetes: Secondary | ICD-10-CM | POA: Diagnosis not present

## 2021-09-26 DIAGNOSIS — F418 Other specified anxiety disorders: Secondary | ICD-10-CM

## 2021-09-26 DIAGNOSIS — Z124 Encounter for screening for malignant neoplasm of cervix: Secondary | ICD-10-CM

## 2021-09-26 DIAGNOSIS — D126 Benign neoplasm of colon, unspecified: Secondary | ICD-10-CM

## 2021-09-26 DIAGNOSIS — Z0000 Encounter for general adult medical examination without abnormal findings: Secondary | ICD-10-CM | POA: Diagnosis not present

## 2021-09-26 DIAGNOSIS — Z113 Encounter for screening for infections with a predominantly sexual mode of transmission: Secondary | ICD-10-CM

## 2021-09-26 DIAGNOSIS — R69 Illness, unspecified: Secondary | ICD-10-CM | POA: Diagnosis not present

## 2021-09-26 LAB — COMPREHENSIVE METABOLIC PANEL WITH GFR
ALT: 10 U/L (ref 0–35)
AST: 13 U/L (ref 0–37)
Albumin: 4.5 g/dL (ref 3.5–5.2)
Alkaline Phosphatase: 66 U/L (ref 39–117)
BUN: 21 mg/dL (ref 6–23)
CO2: 28 meq/L (ref 19–32)
Calcium: 9.8 mg/dL (ref 8.4–10.5)
Chloride: 102 meq/L (ref 96–112)
Creatinine, Ser: 0.79 mg/dL (ref 0.40–1.20)
GFR: 83.2 mL/min
Glucose, Bld: 89 mg/dL (ref 70–99)
Potassium: 4.1 meq/L (ref 3.5–5.1)
Sodium: 139 meq/L (ref 135–145)
Total Bilirubin: 0.5 mg/dL (ref 0.2–1.2)
Total Protein: 7.3 g/dL (ref 6.0–8.3)

## 2021-09-26 LAB — LIPID PANEL
Cholesterol: 224 mg/dL — ABNORMAL HIGH (ref 0–200)
HDL: 64 mg/dL
LDL Cholesterol: 139 mg/dL — ABNORMAL HIGH (ref 0–99)
NonHDL: 160.28
Total CHOL/HDL Ratio: 4
Triglycerides: 106 mg/dL (ref 0.0–149.0)
VLDL: 21.2 mg/dL (ref 0.0–40.0)

## 2021-09-26 LAB — HEMOGLOBIN A1C: Hgb A1c MFr Bld: 6.1 % (ref 4.6–6.5)

## 2021-09-26 NOTE — Assessment & Plan Note (Signed)

## 2021-09-26 NOTE — Assessment & Plan Note (Signed)
Continue anthistamine bid and sinus rinse after outdoor work ?

## 2021-09-26 NOTE — Assessment & Plan Note (Signed)
Complicated by obesity and hypertension.  Weight loss advised  ?

## 2021-09-26 NOTE — Progress Notes (Signed)
The patient is here for annual preventive examination and management of other chronic and acute problems. ?  ?The risk factors are reflected in the social history. ?  ?The roster of all physicians providing medical care to patient - is listed in the Snapshot section of the chart. ?  ?Activities of daily living:  The patient is 100% independent in all ADLs: dressing, toileting, feeding as well as independent mobility ?  ?Home safety : The patient has smoke detectors in the home. They wear seatbelts.  There are no unsecured firearms at home. There is no violence in the home.  ?  ?There is no risks for hepatitis, STDs or HIV. There is no   history of blood transfusion. They have no travel history to infectious disease endemic areas of the world. ?  ?The patient has seen their dentist in the last six month. They have seen their eye doctor in the last year. The patinet  denies slight hearing difficulty with regard to whispered voices and some television programs.  They have deferred audiologic testing in the last year.  They do not  have excessive sun exposure. Discussed the need for sun protection: hats, long sleeves and use of sunscreen if there is significant sun exposure.  ?  ?Diet: the importance of a healthy diet is discussed. They do have a healthy diet. ?  ?The benefits of regular aerobic exercise were discussed. The patient  exercises  3 to 5 days per week  for  60 minutes.  ?  ?Depression screen: there are no signs or vegative symptoms of depression- irritability, change in appetite, anhedonia, sadness/tearfullness. ?  ?The following portions of the patient's history were reviewed and updated as appropriate: allergies, current medications, past family history, past medical history,  past surgical history, past social history  and problem list. ?  ?Visual acuity was not assessed per patient preference since the patient has regular follow up with an  ophthalmologist. Hearing and body mass index were assessed and  reviewed.  ?  ?During the course of the visit the patient was educated and counseled about appropriate screening and preventive services including : fall prevention , diabetes screening, nutrition counseling, colorectal cancer screening, and recommended immunizations.   ? ?Chief Complaint: ? ? 1) sinusitis resolved with prednisone /abx.   ? ?2) allergic rhinitis :  still sneezing despite claritin bid. Mows grass with out a mask  ? ? ?Review of Symptoms ? ?Patient denies headache, fevers, malaise, unintentional weight loss, skin rash, eye pain, sinus congestion and sinus pain, sore throat, dysphagia,  hemoptysis , cough, dyspnea, wheezing, chest pain, palpitations, orthopnea, edema, abdominal pain, nausea, melena, diarrhea, constipation, flank pain, dysuria, hematuria, urinary  Frequency, nocturia, numbness, tingling, seizures,  Focal weakness, Loss of consciousness,  Tremor, insomnia, depression, anxiety, and suicidal ideation.   ? ?Physical Exam: ? ?BP 128/82 (BP Location: Left Arm, Patient Position: Sitting, Cuff Size: Normal)   Pulse (!) 58   Temp 97.8 ?F (36.6 ?C) (Oral)   Ht '5\' 3"'$  (1.6 m)   Wt 204 lb 8 oz (92.8 kg)   SpO2 97%   BMI 36.23 kg/m?   ? ?General Appearance:    Alert, cooperative, no distress, appears stated age  ?Head:    Normocephalic, without obvious abnormality, atraumatic  ?Eyes:    PERRL, conjunctiva/corneas clear, EOM's intact, fundi  ?  benign, both eyes  ?Ears:    Normal TM's and external ear canals, both ears  ?Nose:   Nares normal, septum midline,  mucosa normal, no drainage  ?  or sinus tenderness  ?Throat:   Lips, mucosa, and tongue normal; teeth and gums normal  ?Neck:   Supple, symmetrical, trachea midline, no adenopathy;  ?  thyroid:  no enlargement/tenderness/nodules; no carotid ?  bruit or JVD  ?Back:     Symmetric, no curvature, ROM normal, no CVA tenderness  ?Lungs:     Clear to auscultation bilaterally, respirations unlabored  ?Chest Wall:    No tenderness or deformity  ?  Heart:    Regular rate and rhythm, S1 and S2 normal, no murmur, rub ?  or gallop  ?Breast Exam:    No tenderness, masses, or nipple abnormality  ?Abdomen:     Soft, non-tender, bowel sounds active all four quadrants,  ?  no masses, no organomegaly  ?Genitalia:    Pelvic: cervix normal in appearance, external genitalia normal, no adnexal masses or tenderness, no cervical motion tenderness, rectovaginal septum normal, uterus normal size, shape, and consistency and vagina normal without discharge  ?Extremities:   Extremities normal, atraumatic, no cyanosis or edema  ?Pulses:   2+ and symmetric all extremities  ?Skin:   Skin color, texture, turgor normal, no rashes or lesions  ?Lymph nodes:   Cervical, supraclavicular, and axillary nodes normal  ?Neurologic:   CNII-XII intact, normal strength, sensation and reflexes  ?  throughout  ?  ? ?Assessment and Plan: ? ?Allergic rhinitis due to grass pollen ?Continue anthistamine bid and sinus rinse after outdoor work ? ?Prediabetes ?Complicated by obesity and hypertension.  Weight loss advised  ? ?Tubular adenoma of colon ?5 yr follow up colonoscopy was normal in 2019  .  5 yr follow up due in 2024 ? ?Depression with anxiety ?Symptoms controlled on current therapy  ? ?Encounter for preventive health examination ?age appropriate education and counseling updated, referrals for preventative services and immunizations addressed, dietary and smoking counseling addressed, most recent labs reviewed.  I have personally reviewed and have noted: ?  ?1) the patient's medical and social history ?2) The pt's use of alcohol, tobacco, and illicit drugs ?3) The patient's current medications and supplements ?4) Functional ability including ADL's, fall risk, home safety risk, hearing and visual impairment ?5) Diet and physical activities ?6) Evidence for depression or mood disorder ?7) The patient's height, weight, and BMI have been recorded in the chart  ?I have made referrals, and provided  counseling and education based on review of the above ? ? ?Updated Medication List ?Outpatient Encounter Medications as of 09/26/2021  ?Medication Sig  ? acyclovir (ZOVIRAX) 400 MG tablet TAKE 1 TABLET BY MOUTH EVERY DAY FOR PREVENTION OR OUTBREAK  ? albuterol (VENTOLIN HFA) 108 (90 Base) MCG/ACT inhaler Inhale 2 puffs into the lungs every 6 (six) hours as needed for wheezing or shortness of breath.  ? celecoxib (CELEBREX) 200 MG capsule TAKE 1 CAPSULE(200 MG) BY MOUTH DAILY  ? citalopram (CELEXA) 20 MG tablet TAKE 1 TABLET(20 MG) BY MOUTH DAILY  ? losartan (COZAAR) 100 MG tablet Take 1 tablet (100 mg total) by mouth daily.  ? metoprolol succinate (TOPROL-XL) 50 MG 24 hr tablet TAKE 1 TABLET BY MOUTH EVERY DAY WITH OR IMMEDIATELY FOLLOWING A MEAL  ? mupirocin ointment (BACTROBAN) 2 % Apply 1 application. topically 2 (two) times daily. To nasal ulcer Until resolved  ? omeprazole (PRILOSEC) 40 MG capsule TAKE 1 CAPSULE(40 MG) BY MOUTH DAILY  ? traMADol (ULTRAM) 50 MG tablet TAKE 1 TABLET(50 MG) BY MOUTH EVERY 12 HOURS AS NEEDED FOR  MODERATE PAIN  ? amLODipine (NORVASC) 10 MG tablet Take 1 tablet (10 mg total) by mouth daily.  ? amoxicillin-clavulanate (AUGMENTIN) 875-125 MG tablet Take 1 tablet by mouth 2 (two) times daily. (Patient not taking: Reported on 09/26/2021)  ? predniSONE (DELTASONE) 10 MG tablet 6 tablets on Day 1 , then reduce by 1 tablet daily until gone (Patient not taking: Reported on 09/26/2021)  ? ?No facility-administered encounter medications on file as of 09/26/2021.  ?  ?

## 2021-09-26 NOTE — Assessment & Plan Note (Signed)
Symptoms controlled on current therapy  ?

## 2021-09-26 NOTE — Assessment & Plan Note (Signed)
5 yr follow up colonoscopy was normal in 2019  .  5 yr follow up due in 2024 ?

## 2021-09-27 LAB — HIV ANTIBODY (ROUTINE TESTING W REFLEX): HIV 1&2 Ab, 4th Generation: NONREACTIVE

## 2021-09-27 LAB — CYTOLOGY - PAP
Comment: NEGATIVE
Diagnosis: NEGATIVE
High risk HPV: NEGATIVE

## 2021-09-27 LAB — HEPATITIS C ANTIBODY
Hepatitis C Ab: NONREACTIVE
SIGNAL TO CUT-OFF: 0.06 (ref <1.00)

## 2021-10-14 ENCOUNTER — Other Ambulatory Visit: Payer: Self-pay | Admitting: Internal Medicine

## 2021-10-17 ENCOUNTER — Encounter: Payer: Self-pay | Admitting: Internal Medicine

## 2021-10-17 DIAGNOSIS — J40 Bronchitis, not specified as acute or chronic: Secondary | ICD-10-CM

## 2021-10-17 DIAGNOSIS — J4521 Mild intermittent asthma with (acute) exacerbation: Secondary | ICD-10-CM

## 2021-10-17 MED ORDER — ALBUTEROL SULFATE HFA 108 (90 BASE) MCG/ACT IN AERS: 2.0000 | INHALATION_SPRAY | Freq: Four times a day (QID) | RESPIRATORY_TRACT | 11 refills | Status: DC | PRN

## 2021-11-05 ENCOUNTER — Other Ambulatory Visit: Payer: Self-pay | Admitting: Internal Medicine

## 2021-11-05 DIAGNOSIS — I10 Essential (primary) hypertension: Secondary | ICD-10-CM

## 2021-11-16 ENCOUNTER — Other Ambulatory Visit: Payer: Self-pay

## 2021-11-16 MED ORDER — ACYCLOVIR 400 MG PO TABS: ORAL_TABLET | ORAL | 1 refills | Status: DC

## 2021-11-16 MED ORDER — CITALOPRAM HYDROBROMIDE 20 MG PO TABS: ORAL_TABLET | ORAL | 1 refills | Status: DC

## 2021-11-29 DIAGNOSIS — H5203 Hypermetropia, bilateral: Secondary | ICD-10-CM | POA: Diagnosis not present

## 2021-11-29 DIAGNOSIS — H353131 Nonexudative age-related macular degeneration, bilateral, early dry stage: Secondary | ICD-10-CM | POA: Diagnosis not present

## 2022-01-15 ENCOUNTER — Ambulatory Visit: Admit: 2022-01-15 | Payer: 59

## 2022-01-15 ENCOUNTER — Other Ambulatory Visit: Payer: Self-pay | Admitting: Internal Medicine

## 2022-01-15 DIAGNOSIS — M79662 Pain in left lower leg: Secondary | ICD-10-CM | POA: Diagnosis not present

## 2022-01-17 ENCOUNTER — Other Ambulatory Visit: Payer: Self-pay

## 2022-01-17 MED ORDER — TRAMADOL HCL 50 MG PO TABS: ORAL_TABLET | ORAL | 3 refills | Status: DC

## 2022-01-17 NOTE — Telephone Encounter (Signed)
Refilled: 06/29/2021 Last OV: 09/26/2021 Next OV: not scheduled

## 2022-02-02 ENCOUNTER — Other Ambulatory Visit: Payer: Self-pay | Admitting: Internal Medicine

## 2022-02-02 ENCOUNTER — Telehealth: Payer: Self-pay

## 2022-02-02 DIAGNOSIS — K219 Gastro-esophageal reflux disease without esophagitis: Secondary | ICD-10-CM

## 2022-02-02 MED ORDER — OMEPRAZOLE 40 MG PO CPDR: DELAYED_RELEASE_CAPSULE | ORAL | 3 refills | Status: DC

## 2022-02-02 MED ORDER — AMLODIPINE BESYLATE 10 MG PO TABS: 10.0000 mg | ORAL_TABLET | Freq: Every day | ORAL | 0 refills | Status: DC

## 2022-02-02 MED ORDER — CELECOXIB 200 MG PO CAPS: 200.0000 mg | ORAL_CAPSULE | Freq: Every day | ORAL | 0 refills | Status: DC

## 2022-02-02 NOTE — Telephone Encounter (Signed)
Medication has been refilled.

## 2022-02-02 NOTE — Telephone Encounter (Signed)
Patient states she needs a refill for her omeprazole (PRILOSEC) 40 MG capsule.  *Patient states her preferred pharmacy is [facility] in [place].

## 2022-05-01 ENCOUNTER — Other Ambulatory Visit: Payer: Self-pay | Admitting: Internal Medicine

## 2022-05-01 DIAGNOSIS — I10 Essential (primary) hypertension: Secondary | ICD-10-CM

## 2022-05-22 ENCOUNTER — Encounter: Payer: Self-pay | Admitting: Internal Medicine

## 2022-05-23 MED ORDER — DIAZEPAM 5 MG PO TABS: 5.0000 mg | ORAL_TABLET | Freq: Two times a day (BID) | ORAL | 0 refills | Status: DC | PRN

## 2022-05-30 NOTE — Telephone Encounter (Signed)
Lab D/C'd

## 2022-06-07 ENCOUNTER — Other Ambulatory Visit: Payer: Self-pay | Admitting: Internal Medicine

## 2022-06-29 ENCOUNTER — Telehealth: Payer: 59 | Admitting: Internal Medicine

## 2022-07-17 ENCOUNTER — Other Ambulatory Visit: Payer: Self-pay

## 2022-07-17 MED ORDER — AMLODIPINE BESYLATE 10 MG PO TABS: 10.0000 mg | ORAL_TABLET | Freq: Every day | ORAL | 0 refills | Status: DC

## 2022-07-19 ENCOUNTER — Encounter: Payer: Self-pay | Admitting: Internal Medicine

## 2022-07-19 ENCOUNTER — Ambulatory Visit: Payer: 59 | Admitting: Internal Medicine

## 2022-07-19 VITALS — BP 138/82 | HR 58 | Temp 98.2°F | Ht 63.0 in | Wt 191.8 lb

## 2022-07-19 DIAGNOSIS — E7800 Pure hypercholesterolemia, unspecified: Secondary | ICD-10-CM

## 2022-07-19 DIAGNOSIS — R7303 Prediabetes: Secondary | ICD-10-CM | POA: Diagnosis not present

## 2022-07-19 DIAGNOSIS — R69 Illness, unspecified: Secondary | ICD-10-CM | POA: Diagnosis not present

## 2022-07-19 DIAGNOSIS — Z79899 Other long term (current) drug therapy: Secondary | ICD-10-CM

## 2022-07-19 DIAGNOSIS — I10 Essential (primary) hypertension: Secondary | ICD-10-CM

## 2022-07-19 DIAGNOSIS — J0130 Acute sphenoidal sinusitis, unspecified: Secondary | ICD-10-CM

## 2022-07-19 DIAGNOSIS — J439 Emphysema, unspecified: Secondary | ICD-10-CM

## 2022-07-19 DIAGNOSIS — F411 Generalized anxiety disorder: Secondary | ICD-10-CM

## 2022-07-19 MED ORDER — SERTRALINE HCL 50 MG PO TABS: 50.0000 mg | ORAL_TABLET | Freq: Every day | ORAL | 1 refills | Status: DC

## 2022-07-19 MED ORDER — AMOXICILLIN-POT CLAVULANATE 875-125 MG PO TABS: 1.0000 | ORAL_TABLET | Freq: Two times a day (BID) | ORAL | 0 refills | Status: DC

## 2022-07-19 NOTE — Progress Notes (Unsigned)
Subjective:  Patient ID: Melanie Fisher, female    DOB: [birth year]  Age: 57 y.o. MRN: [id number]  CC: The primary encounter diagnosis was Essential hypertension. Diagnoses of Pure hypercholesterolemia, Prediabetes, and Long-term use of high-risk medication were also pertinent to this visit.   HPI Melanie Fisher presents for  Chief Complaint  Patient presents with   Depression   1) Grief complicated by depression :   HER SON Melanie Fisher DIED DEC 1  AFTER A DRUG OVERDOSE  ON NOV 25  (heroin)  he had been clean for 2 yrs  .  HER YOUNGER SON,  Melanie Fisher ,  22 yrs old  IS NOW INJECTING HEROIN AND RECENTLY  HOSPITALIZED FOR SEPSIS.  Family is unified in not tolerating or enabling  his drug habit. Happily married to [place]   2) anxiety:  finished valium needs something for anxiety.   3) history of tobacco abuse:  smoked for one day on the day Melanie Fisher died,  has not resumed    Outpatient Medications Prior to Visit  Medication Sig Dispense Refill   acyclovir (ZOVIRAX) 400 MG tablet TAKE 1 TABLET BY MOUTH EVERY DAY FOR PREVENTION OR OUTBREAK 90 tablet 1   amLODipine (NORVASC) 10 MG tablet Take 1 tablet (10 mg total) by mouth daily. 90 tablet 0   celecoxib (CELEBREX) 200 MG capsule TAKE 1 CAPSULE(200 MG) BY MOUTH DAILY 90 capsule 0   citalopram (CELEXA) 20 MG tablet TAKE 1 TABLET(20 MG) BY MOUTH DAILY 90 tablet 1   losartan (COZAAR) 100 MG tablet TAKE 1 TABLET(100 MG) BY MOUTH DAILY 90 tablet 1   metoprolol succinate (TOPROL-XL) 50 MG 24 hr tablet TAKE 1 TABLET BY MOUTH EVERY DAY WITH OR IMMEDIATELY FOLLOWING A MEAL 90 tablet 1   omeprazole (PRILOSEC) 40 MG capsule TAKE 1 CAPSULE(40 MG) BY MOUTH DAILY 90 capsule 3   traMADol (ULTRAM) 50 MG tablet TAKE 1 TABLET(50 MG) BY MOUTH EVERY 12 HOURS AS NEEDED FOR MODERATE PAIN 60 tablet 3   albuterol (VENTOLIN HFA) 108 (90 Base) MCG/ACT inhaler Inhale 2 puffs into the lungs every 6 (six) hours as needed for wheezing or shortness of breath. (Patient  not taking: Reported on 07/19/2022) 18 g 11   amoxicillin-clavulanate (AUGMENTIN) 875-125 MG tablet Take 1 tablet by mouth 2 (two) times daily. (Patient not taking: Reported on 09/26/2021) 14 tablet 0   diazepam (VALIUM) 5 MG tablet Take 1 tablet (5 mg total) by mouth every 12 (twelve) hours as needed for anxiety. (Patient not taking: Reported on 07/19/2022) 20 tablet 0   mupirocin ointment (BACTROBAN) 2 % Apply 1 application. topically 2 (two) times daily. To nasal ulcer Until resolved (Patient not taking: Reported on 07/19/2022) 22 g 0   predniSONE (DELTASONE) 10 MG tablet 6 tablets on Day 1 , then reduce by 1 tablet daily until gone (Patient not taking: Reported on 09/26/2021) 21 tablet 0   No facility-administered medications prior to visit.    Review of Systems;  Patient denies headache, fevers, malaise, unintentional weight loss, skin rash, eye pain, sinus congestion and sinus pain, sore throat, dysphagia,  hemoptysis , cough, dyspnea, wheezing, chest pain, palpitations, orthopnea, edema, abdominal pain, nausea, melena, diarrhea, constipation, flank pain, dysuria, hematuria, urinary  Frequency, nocturia, numbness, tingling, seizures,  Focal weakness, Loss of consciousness,  Tremor, insomnia, depression, anxiety, and suicidal ideation.      Objective:  BP 138/82   Pulse (!) 58   Temp 98.2 F (36.8 C) (Oral)   Ht  $'5\' 3"'n$  (1.6 m)   Wt 191 lb 12.8 oz (87 kg)   SpO2 97%   BMI 33.98 kg/m   BP Readings from Last 3 Encounters:  07/19/22 138/82  09/26/21 128/82  06/27/21 112/70    Wt Readings from Last 3 Encounters:  07/19/22 191 lb 12.8 oz (87 kg)  09/26/21 204 lb 8 oz (92.8 kg)  06/27/21 200 lb 12.8 oz (91.1 kg)    Physical Exam  Lab Results  Component Value Date   HGBA1C 6.1 09/26/2021   HGBA1C 5.8 04/06/2021   HGBA1C 6.0 12/22/2020    Lab Results  Component Value Date   CREATININE 0.79 09/26/2021   CREATININE 1.09 04/06/2021   CREATININE 0.97 03/28/2021    Lab Results   Component Value Date   WBC 4.8 03/28/2021   HGB 11.5 (L) 03/28/2021   HCT 35.0 (L) 03/28/2021   PLT 195 03/28/2021   GLUCOSE 89 09/26/2021   CHOL 224 (H) 09/26/2021   TRIG 106.0 09/26/2021   HDL 64.00 09/26/2021   LDLDIRECT 131.0 05/23/2016   LDLCALC 139 (H) 09/26/2021   ALT 10 09/26/2021   AST 13 09/26/2021   NA 139 09/26/2021   K 4.1 09/26/2021   CL 102 09/26/2021   CREATININE 0.79 09/26/2021   BUN 21 09/26/2021   CO2 28 09/26/2021   TSH 4.31 12/22/2020   HGBA1C 6.1 09/26/2021   MICROALBUR 0.4 02/21/2019    No results found.  Assessment & Plan:  .Essential hypertension  Pure hypercholesterolemia  Prediabetes  Long-term use of high-risk medication     I provided 30 minutes of face-to-face time during this encounter reviewing patient's last visit with me, patient's  most recent visit with cardiology,  nephrology,  and neurology,  recent surgical and non surgical procedures, previous  labs and imaging studies, counseling on currently addressed issues,  and post visit ordering to diagnostics and therapeutics .   Follow-up: No follow-ups on file.   Crecencio Mc, MD

## 2022-07-19 NOTE — Patient Instructions (Addendum)
Stop citalopram and start zoloft instead , start with a  full tablet daily  Increase the sertraline dose to 100 mg after 2 weeks if you are still very anxious   Use the  valium, as needed , just for sleep .  Avoid daytime use   PLEASE CONSIDER FINDING A SUPPORT GROUP.  LOOK UP NARCOTICS ANONYMOUS FOR FAMILY MEMBERS    Prescribing augmentin for one week for the sinuses  continue the probiotic

## 2022-07-20 MED ORDER — DIAZEPAM 5 MG PO TABS: 5.0000 mg | ORAL_TABLET | Freq: Every evening | ORAL | 0 refills | Status: DC | PRN

## 2022-07-20 NOTE — Assessment & Plan Note (Signed)
Well controlled on current regimen of amlodipine, losartan and metoprolol . Renal function stable, no changes today.

## 2022-07-20 NOTE — Assessment & Plan Note (Signed)
Given chronicity of symptoms, development of facial pain and exam consistent with bacterial URI,  Will treat with empiric antibiotics, decongestants, and saline lavage. Probiotic advised

## 2022-07-20 NOTE — Assessment & Plan Note (Signed)
Starting sertraline and stopping  citalopram.  Starting dose 50 mg daily, , increase to 100 mg after 2 weeks  valium refilled for qhs use only .  The risks and benefits of benzodiazepine use were discussed with patient today including excessive sedation leading to respiratory depression,  impaired thinking/driving, and addiction.  Patient was advised to avoid concurrent use with alcohol, to use medication only as needed and not to share with others  . Follow up one month

## 2022-07-20 NOTE — Assessment & Plan Note (Signed)
Secondary to History of  tobacco abuse .PFTS done remotely.  Not symptomatic with ADLs. She is currently asymptomatic

## 2022-07-27 ENCOUNTER — Other Ambulatory Visit: Payer: Self-pay | Admitting: Internal Medicine

## 2022-09-09 ENCOUNTER — Other Ambulatory Visit: Payer: Self-pay | Admitting: Internal Medicine

## 2022-09-11 MED ORDER — DIAZEPAM 5 MG PO TABS: 5.0000 mg | ORAL_TABLET | Freq: Every evening | ORAL | 1 refills | Status: AC | PRN

## 2022-09-12 ENCOUNTER — Other Ambulatory Visit: Payer: Self-pay | Admitting: Internal Medicine

## 2022-09-12 NOTE — Telephone Encounter (Signed)
Refilled: 01/17/2022 Last OV: 07/19/2022 Next OV: 10/17/2022

## 2022-10-04 NOTE — Progress Notes (Unsigned)
[name], NP-C Phone: [phone]  Melanie Fisher is a 57 y.o. female who presents today for low back pain.  Back Pain  She reports new onset back pain. There was not an injury that may have caused the pain. The most recent episode started  3 weeks ago and is gradually worsening. The pain is located in the right lumbar area radiating into pelvis, around front of abdomen. It is described as stabbing, is 6/10 in intensity, occurring constantly.  Aggravating factors: turning/rotation, laying on her right side.  Relieving factors: sitting and walking. She has tried application of heat, acetaminophen, and prescription pain relievers with mild relief. She reports increased urinary frequency and urgency. Denies hematuria.   Associated symptoms: Yes abdominal pain No bowel incontinence  No chest pain No dysuria   No fever No headaches  No joint pains Yes pelvic pain  No weakness in leg  No tingling in lower extremities  No urinary incontinence No weight loss   -----------------------------------------------------------------------------------------   Social History   Tobacco Use  Smoking Status Former   Packs/day: 0.50   Years: 21.00   Additional pack years: 0.00   Total pack years: 10.50   Types: Cigarettes   Quit date: 08/2020   Years since quitting: 2.1  Smokeless Tobacco Never    Current Outpatient Medications on File Prior to Visit  Medication Sig Dispense Refill   acyclovir (ZOVIRAX) 400 MG tablet TAKE 1 TABLET BY MOUTH EVERY DAY FOR PREVENTION OR OUTBREAK 90 tablet 1   amLODipine (NORVASC) 10 MG tablet Take 1 tablet (10 mg total) by mouth daily. 90 tablet 0   celecoxib (CELEBREX) 200 MG capsule TAKE 1 CAPSULE(200 MG) BY MOUTH DAILY 90 capsule 0   diazepam (VALIUM) 5 MG tablet Take 1 tablet (5 mg total) by mouth at bedtime as needed for anxiety. 30 tablet 1   losartan (COZAAR) 100 MG tablet TAKE 1 TABLET(100 MG) BY MOUTH DAILY 90 tablet 1   metoprolol succinate  (TOPROL-XL) 50 MG 24 hr tablet TAKE 1 TABLET BY MOUTH EVERY DAY WITH OR IMMEDIATELY FOLLOWING A MEAL 90 tablet 1   omeprazole (PRILOSEC) 40 MG capsule TAKE 1 CAPSULE(40 MG) BY MOUTH DAILY 90 capsule 3   sertraline (ZOLOFT) 50 MG tablet Take 1 tablet (50 mg total) by mouth daily. 90 tablet 1   traMADol (ULTRAM) 50 MG tablet TAKE 1 TABLET(50 MG) BY MOUTH EVERY 12 HOURS AS NEEDED FOR MODERATE PAIN 60 tablet 5   No current facility-administered medications on file prior to visit.    ROS see history of present illness  Objective  Physical Exam Vitals:   10/05/22 0807  BP: 120/82  Pulse: (!) 56  Temp: 98.2 F (36.8 C)  SpO2: 98%    BP Readings from Last 3 Encounters:  10/05/22 120/82  07/19/22 138/82  09/26/21 128/82   Wt Readings from Last 3 Encounters:  10/05/22 192 lb 6.4 oz (87.3 kg)  07/19/22 191 lb 12.8 oz (87 kg)  09/26/21 204 lb 8 oz (92.8 kg)    Physical Exam Constitutional:      General: She is not in acute distress.    Appearance: Normal appearance.  HENT:     Head: Normocephalic.  Cardiovascular:     Rate and Rhythm: Normal rate and regular rhythm.     Heart sounds: Normal heart sounds.  Pulmonary:     Effort: Pulmonary effort is normal.     Breath sounds: Normal breath sounds.  Abdominal:     General:  Abdomen is flat. Bowel sounds are normal.     Palpations: Abdomen is soft.     Tenderness: There is no abdominal tenderness. There is no right CVA tenderness.  Musculoskeletal:     Lumbar back: Normal. No swelling or tenderness. Normal range of motion.     Right hip: Normal. Normal range of motion. Normal strength.     Left hip: Normal.  Skin:    General: Skin is warm and dry.  Neurological:     General: No focal deficit present.     Mental Status: She is alert.  Psychiatric:        Mood and Affect: Mood normal.        Behavior: Behavior normal.    Assessment/Plan: Please see individual problem list.  Acute right-sided low back pain without  sciatica Assessment & Plan: Likely musculoskeletal with radiation into pelvis. Pain only with turning/rotating body. UA in office negative. No tenderness on palpation. ROM and strength WNL. Declined xray today, would like to try medication first and will return for xray if not improving. Will treat with Flexeril 5 mg TID PRN and MDP. Encouraged to continue ice/heat, rest and Tylenol/Tramadol for pain. Return precautions given to patient.   Orders: -     methylPREDNISolone; Take as directed.  Dispense: 21 each; Refill: 0 -     Cyclobenzaprine HCl; Take 1 tablet (5 mg total) by mouth 3 (three) times daily as needed for muscle spasms.  Dispense: 30 tablet; Refill: 0  Pelvic pain -     POCT Urinalysis Dipstick (Automated)   Return if symptoms worsen or fail to improve.   [name], NP-C [facility] [facility]

## 2022-10-05 ENCOUNTER — Encounter: Payer: Self-pay | Admitting: Nurse Practitioner

## 2022-10-05 ENCOUNTER — Ambulatory Visit: Payer: 59 | Admitting: Nurse Practitioner

## 2022-10-05 VITALS — BP 120/82 | HR 56 | Temp 98.2°F | Ht 63.0 in | Wt 192.4 lb

## 2022-10-05 DIAGNOSIS — G8929 Other chronic pain: Secondary | ICD-10-CM | POA: Insufficient documentation

## 2022-10-05 DIAGNOSIS — R102 Pelvic and perineal pain: Secondary | ICD-10-CM | POA: Diagnosis not present

## 2022-10-05 DIAGNOSIS — M545 Low back pain, unspecified: Secondary | ICD-10-CM | POA: Diagnosis not present

## 2022-10-05 LAB — POC URINALSYSI DIPSTICK (AUTOMATED)
Bilirubin, UA: NEGATIVE
Blood, UA: NEGATIVE
Glucose, UA: NEGATIVE
Ketones, UA: NEGATIVE
Leukocytes, UA: NEGATIVE
Nitrite, UA: NEGATIVE
Protein, UA: NEGATIVE
Spec Grav, UA: 1.02 (ref 1.010–1.025)
Urobilinogen, UA: 0.2 E.U./dL
pH, UA: 5.5 (ref 5.0–8.0)

## 2022-10-05 MED ORDER — METHYLPREDNISOLONE 4 MG PO TBPK: ORAL_TABLET | ORAL | 0 refills | Status: DC

## 2022-10-05 MED ORDER — CYCLOBENZAPRINE HCL 5 MG PO TABS
5.0000 mg | ORAL_TABLET | Freq: Three times a day (TID) | ORAL | 0 refills | Status: DC | PRN
Start: 2022-10-05 — End: 2022-12-14

## 2022-10-05 NOTE — Assessment & Plan Note (Addendum)
Likely musculoskeletal with radiation into pelvis. Pain only with turning/rotating body. UA in office negative. No tenderness on palpation. ROM and strength WNL. Declined xray today, would like to try medication first and will return for xray if not improving. Will treat with Flexeril 5 mg TID PRN and MDP. Encouraged to continue ice/heat, rest and Tylenol/Tramadol for pain. Return precautions given to patient.

## 2022-10-06 ENCOUNTER — Other Ambulatory Visit: Payer: Self-pay | Admitting: Internal Medicine

## 2022-10-06 DIAGNOSIS — Z1231 Encounter for screening mammogram for malignant neoplasm of breast: Secondary | ICD-10-CM

## 2022-10-12 ENCOUNTER — Other Ambulatory Visit (INDEPENDENT_AMBULATORY_CARE_PROVIDER_SITE_OTHER): Payer: 59

## 2022-10-12 ENCOUNTER — Other Ambulatory Visit: Payer: Self-pay | Admitting: Internal Medicine

## 2022-10-12 DIAGNOSIS — I10 Essential (primary) hypertension: Secondary | ICD-10-CM

## 2022-10-12 DIAGNOSIS — Z79899 Other long term (current) drug therapy: Secondary | ICD-10-CM

## 2022-10-12 DIAGNOSIS — R7303 Prediabetes: Secondary | ICD-10-CM

## 2022-10-12 DIAGNOSIS — E7800 Pure hypercholesterolemia, unspecified: Secondary | ICD-10-CM

## 2022-10-12 LAB — CBC WITH DIFFERENTIAL/PLATELET
Basophils Absolute: 0 10*3/uL (ref 0.0–0.1)
Basophils Relative: 0.8 % (ref 0.0–3.0)
Eosinophils Absolute: 0.2 10*3/uL (ref 0.0–0.7)
Eosinophils Relative: 3.5 % (ref 0.0–5.0)
HCT: 36.5 % (ref 36.0–46.0)
Hemoglobin: 12.1 g/dL (ref 12.0–15.0)
Lymphocytes Relative: 44.2 % (ref 12.0–46.0)
Lymphs Abs: 2.8 10*3/uL (ref 0.7–4.0)
MCHC: 33.2 g/dL (ref 30.0–36.0)
MCV: 92.3 fl (ref 78.0–100.0)
Monocytes Absolute: 0.5 10*3/uL (ref 0.1–1.0)
Monocytes Relative: 7.7 % (ref 3.0–12.0)
Neutro Abs: 2.8 10*3/uL (ref 1.4–7.7)
Neutrophils Relative %: 43.8 % (ref 43.0–77.0)
Platelets: 230 10*3/uL (ref 150.0–400.0)
RBC: 3.96 Mil/uL (ref 3.87–5.11)
RDW: 12.8 % (ref 11.5–15.5)
WBC: 6.3 10*3/uL (ref 4.0–10.5)

## 2022-10-12 LAB — LIPID PANEL
Cholesterol: 185 mg/dL (ref 0–200)
HDL: 52.5 mg/dL (ref >39.00)
LDL Cholesterol: 116 mg/dL — ABNORMAL HIGH (ref 0–99)
NonHDL: 132.24
Total CHOL/HDL Ratio: 4
Triglycerides: 79 mg/dL (ref 0.0–149.0)
VLDL: 15.8 mg/dL (ref 0.0–40.0)

## 2022-10-12 LAB — LDL CHOLESTEROL, DIRECT: Direct LDL: 116 mg/dL

## 2022-10-12 LAB — COMPREHENSIVE METABOLIC PANEL
ALT: 7 U/L (ref 0–35)
AST: 11 U/L (ref 0–37)
Albumin: 4.1 g/dL (ref 3.5–5.2)
Alkaline Phosphatase: 61 U/L (ref 39–117)
BUN: 40 mg/dL — ABNORMAL HIGH (ref 6–23)
CO2: 25 mEq/L (ref 19–32)
Calcium: 9.2 mg/dL (ref 8.4–10.5)
Chloride: 107 mEq/L (ref 96–112)
Creatinine, Ser: 1.05 mg/dL (ref 0.40–1.20)
GFR: 58.7 mL/min — ABNORMAL LOW (ref >60.00)
Glucose, Bld: 84 mg/dL (ref 70–99)
Potassium: 4 mEq/L (ref 3.5–5.1)
Sodium: 140 mEq/L (ref 135–145)
Total Bilirubin: 0.4 mg/dL (ref 0.2–1.2)
Total Protein: 7.3 g/dL (ref 6.0–8.3)

## 2022-10-12 LAB — TSH: TSH: 4.49 u[IU]/mL (ref 0.35–5.50)

## 2022-10-12 LAB — MICROALBUMIN / CREATININE URINE RATIO
Creatinine,U: 72.8 mg/dL
Microalb Creat Ratio: 1 mg/g (ref 0.0–30.0)
Microalb, Ur: <0.7 mg/dL (ref 0.0–1.9)

## 2022-10-12 LAB — HEMOGLOBIN A1C: Hgb A1c MFr Bld: 5.8 % (ref 4.6–6.5)

## 2022-10-17 ENCOUNTER — Ambulatory Visit: Payer: 59 | Admitting: Internal Medicine

## 2022-10-17 ENCOUNTER — Encounter: Payer: Self-pay | Admitting: Internal Medicine

## 2022-10-17 VITALS — BP 108/66 | HR 60 | Temp 97.5°F | Ht 63.0 in | Wt 191.2 lb

## 2022-10-17 DIAGNOSIS — R944 Abnormal results of kidney function studies: Secondary | ICD-10-CM | POA: Diagnosis not present

## 2022-10-17 DIAGNOSIS — M545 Low back pain, unspecified: Secondary | ICD-10-CM | POA: Diagnosis not present

## 2022-10-17 DIAGNOSIS — I10 Essential (primary) hypertension: Secondary | ICD-10-CM | POA: Diagnosis not present

## 2022-10-17 DIAGNOSIS — F411 Generalized anxiety disorder: Secondary | ICD-10-CM | POA: Diagnosis not present

## 2022-10-17 DIAGNOSIS — N179 Acute kidney failure, unspecified: Secondary | ICD-10-CM

## 2022-10-17 DIAGNOSIS — F418 Other specified anxiety disorders: Secondary | ICD-10-CM

## 2022-10-17 MED ORDER — LOSARTAN POTASSIUM 100 MG PO TABS: ORAL_TABLET | ORAL | 1 refills | Status: DC

## 2022-10-17 MED ORDER — SERTRALINE HCL 50 MG PO TABS: 50.0000 mg | ORAL_TABLET | Freq: Every day | ORAL | 1 refills | Status: DC

## 2022-10-17 MED ORDER — CELECOXIB 200 MG PO CAPS: ORAL_CAPSULE | ORAL | 2 refills | Status: DC

## 2022-10-17 MED ORDER — METOPROLOL SUCCINATE ER 50 MG PO TB24: ORAL_TABLET | ORAL | 1 refills | Status: DC

## 2022-10-17 NOTE — Assessment & Plan Note (Signed)
Rechecking today.  Taking NSAID  Lab Results  Component Value Date   CREATININE 1.05 10/12/2022

## 2022-10-17 NOTE — Assessment & Plan Note (Signed)
Well controlled on current regimen of amlodipine, losartan and metoprolol . Renal function stable, no changes today.  

## 2022-10-17 NOTE — Assessment & Plan Note (Signed)
Improved symptoms with sertraline . No changes today

## 2022-10-17 NOTE — Progress Notes (Signed)
Subjective:  Patient ID: Melanie Fisher, female    DOB: 1965-11-02  Age: 57 y.o. MRN: [id number]  CC: The primary encounter diagnosis was Depression with anxiety. Diagnoses of Essential hypertension, Acute right-sided low back pain without sciatica, Decreased GFR, and Generalized anxiety disorder were also pertinent to this visit.   HPI Melanie Fisher presents for  Chief Complaint  Patient presents with   Medical Management of Chronic Issues   1) Depression:  currently taking sertraline 50 mg daily and feeling much better.  Sleeping better at night,  more alert during the day   2) low back pain started around 6 weeks ago.    Right sided.  Triedusing  heating pad, tylenol ."Arthritis " medicine.  non radiating.  Was treated with prednisone taper April 18  helped only transiently (did not stop the celebrex during the prednisone taper ).  Pain has returned after stopping prednisone. . 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 30 tablet 0   diazepam (VALIUM) 5 MG tablet Take 1 tablet (5 mg total) by mouth at bedtime as needed for anxiety. 30 tablet 1   omeprazole (PRILOSEC) 40 MG capsule TAKE 1 CAPSULE(40 MG) BY MOUTH DAILY 90 capsule 3   traMADol (ULTRAM) 50 MG tablet TAKE 1 TABLET(50 MG) BY MOUTH EVERY 12 HOURS AS NEEDED FOR  MODERATE PAIN 60 tablet 5   celecoxib (CELEBREX) 200 MG capsule TAKE 1 CAPSULE(200 MG) BY MOUTH DAILY 90 capsule 0   losartan (COZAAR) 100 MG tablet TAKE 1 TABLET(100 MG) BY MOUTH DAILY 90 tablet 1   metoprolol succinate (TOPROL-XL) 50 MG 24 hr tablet TAKE 1 TABLET BY MOUTH EVERY DAY WITH OR IMMEDIATELY FOLLOWING A MEAL 90 tablet 1   sertraline (ZOLOFT) 50 MG tablet Take 1 tablet (50 mg total) by mouth daily. 90 tablet 1   methylPREDNISolone (MEDROL DOSEPAK) 4 MG TBPK tablet Take as directed. (Patient not taking: Reported on 10/17/2022) 21 each 0   No facility-administered medications prior to visit.    Review of Systems;  Patient denies headache, fevers, malaise, unintentional weight loss, skin rash, eye pain, sinus congestion and sinus pain, sore throat, dysphagia,  hemoptysis , cough, dyspnea, wheezing, chest pain, palpitations, orthopnea, edema, abdominal pain, nausea, melena, diarrhea, constipation, flank pain, dysuria, hematuria, urinary  Frequency, nocturia, numbness, tingling, seizures,  Focal weakness, Loss of consciousness,  Tremor, insomnia, depression, anxiety, and suicidal ideation.      Objective:  BP 108/66   Pulse 60   Temp (!) 97.5 F (36.4 C) (Oral)   Ht 5\' 3"  (1.6 m)   Wt 191 lb 3.2 oz (86.7 kg)   SpO2 97%   BMI 33.87 kg/m   BP Readings from Last 3 Encounters:  10/17/22 108/66  10/05/22 120/82  07/19/22 138/82    Wt Readings from Last 3 Encounters:  10/17/22 191 lb 3.2 oz (86.7 kg)  10/05/22 192 lb 6.4 oz (87.3 kg)  07/19/22 191 lb 12.8 oz (87 kg)    Physical Exam  Lab Results  Component Value Date   HGBA1C 5.8 10/12/2022   HGBA1C 6.1 09/26/2021   HGBA1C 5.8 04/06/2021    Lab Results  Component Value Date   CREATININE 1.05 10/12/2022   CREATININE 0.79 09/26/2021   CREATININE 1.09 04/06/2021    Lab Results  Component Value Date   WBC 6.3 10/12/2022   HGB 12.1 10/12/2022   HCT 36.5 10/12/2022   PLT 230.0 10/12/2022   GLUCOSE 84 10/12/2022    CHOL 185 10/12/2022   TRIG 79.0 10/12/2022   HDL 52.50 10/12/2022   LDLDIRECT 116.0 10/12/2022   LDLCALC 116 (H) 10/12/2022   ALT 7 10/12/2022   AST 11 10/12/2022   NA 140 10/12/2022   K 4.0 10/12/2022   CL 107 10/12/2022   CREATININE 1.05 10/12/2022   BUN 40 (H) 10/12/2022   CO2 25 10/12/2022   TSH 4.49 10/12/2022   HGBA1C 5.8 10/12/2022   MICROALBUR <0.7 10/12/2022    No results found.  Assessment & Plan:  .Depression with anxiety  Essential hypertension Assessment & Plan: Well controlled on current regimen of amlodipine, losartan and metoprolol . Renal function stable, no changes today.   Orders: -     Metoprolol Succinate ER; Take with or immediately following a meal.  Dispense: 90 tablet; Refill: 1  Acute right-sided low back pain without sciatica Assessment & Plan: Present for the past 6 weeks,  transeiently improved with prednisone taper not brought on by home yardwork,  .  Continue celebrex. PT evaluation   Orders: -     Ambulatory referral to Physical Therapy  Decreased GFR Assessment & Plan: Rechecking today.  Taking NSAID  Lab Results  Component Value Date   CREATININE 1.05 10/12/2022     Orders: -     Basic metabolic panel  Generalized anxiety disorder Assessment & Plan: Improved symptoms with sertraline . No changes today    Other orders -     Celecoxib; TAKE 1 CAPSULE(200 MG) BY MOUTH TWO TIMES DAILY  Dispense: 60 capsule; Refill: 2 -     Losartan Potassium; TAKE 1 TABLET(100 MG) BY MOUTH DAILY  Dispense: 90 tablet; Refill: 1 -     Sertraline HCl; Take 1 tablet (50 mg total) by mouth daily.  Dispense: 90 tablet; Refill: 1     I provided 30 minutes of face-to-face time during this encounter reviewing patient's last visit with me, patient's  most recent visit with cardiology,  nephrology,  and neurology,  recent surgical and non surgical procedures, previous  labs and imaging studies, counseling on currently addressed issues,  and post  visit ordering to diagnostics and therapeutics .   Follow-up: Return in about 6 months (around 04/18/2023).   Sherlene Shams, MD

## 2022-10-17 NOTE — Assessment & Plan Note (Signed)
Present for the past 6 weeks,  transeiently improved with prednisone taper not brought on by home yardwork,  .  Continue celebrex. PT evaluation

## 2022-10-17 NOTE — Patient Instructions (Addendum)
1) The kidney function was OFF on your labs . This may have been due to your fasting state.   Recheck kidney function in 2-3 weeks  when well hydrated   (60 ounces daily is your goal )   For your back pain:  2) Continue  taking 1000 mg tylenol every 12 hours   Continue celebrex as well but  increase to twice daily for one week, then reduce celebrex to once daily  I have made a Physical therapy referral since this has been going on for 6 weeks   Exercise: (supported back extension, standing)  Stand against a counter or sofa with your buttocks resting on the edge and your hands on the edge as well on either side of your back  Slowly lean backwards,  Bending from the waist, until you feel slight discomfort. Restore yourself to vertical position (up straight) Repeat the back extension 10 times , each time extending a little farther).

## 2022-10-18 ENCOUNTER — Encounter: Payer: Self-pay | Admitting: Internal Medicine

## 2022-10-18 LAB — BASIC METABOLIC PANEL
BUN: 36 mg/dL — ABNORMAL HIGH (ref 6–23)
CO2: 23 mEq/L (ref 19–32)
Calcium: 9.5 mg/dL (ref 8.4–10.5)
Chloride: 104 mEq/L (ref 96–112)
Creatinine, Ser: 1.41 mg/dL — ABNORMAL HIGH (ref 0.40–1.20)
GFR: 41.21 mL/min — ABNORMAL LOW (ref >60.00)
Glucose, Bld: 81 mg/dL (ref 70–99)
Potassium: 4.2 mEq/L (ref 3.5–5.1)
Sodium: 137 mEq/L (ref 135–145)

## 2022-10-18 NOTE — Addendum Note (Signed)
Addended by: Sherlene Shams on: 10/18/2022 01:13 PM   Modules accepted: Orders

## 2022-10-25 ENCOUNTER — Other Ambulatory Visit: Payer: Self-pay | Admitting: Internal Medicine

## 2022-10-25 NOTE — Telephone Encounter (Signed)
Medication was discontinued on 07/19/2022. Is it okay to refuse?

## 2022-11-02 ENCOUNTER — Other Ambulatory Visit (INDEPENDENT_AMBULATORY_CARE_PROVIDER_SITE_OTHER): Payer: 59

## 2022-11-02 DIAGNOSIS — M545 Low back pain, unspecified: Secondary | ICD-10-CM

## 2022-11-03 ENCOUNTER — Ambulatory Visit
Admission: RE | Admit: 2022-11-03 | Discharge: 2022-11-03 | Disposition: A | Discharge status: Home / Self Care | Payer: 59 | Source: Ambulatory Visit | Attending: Internal Medicine | Admitting: Internal Medicine

## 2022-11-03 DIAGNOSIS — Z1231 Encounter for screening mammogram for malignant neoplasm of breast: Secondary | ICD-10-CM

## 2022-11-03 LAB — BASIC METABOLIC PANEL
BUN: 23 mg/dL (ref 6–23)
CO2: 28 mEq/L (ref 19–32)
Calcium: 9.5 mg/dL (ref 8.4–10.5)
Chloride: 103 mEq/L (ref 96–112)
Creatinine, Ser: 1.09 mg/dL (ref 0.40–1.20)
GFR: 56.1 mL/min — ABNORMAL LOW (ref >60.00)
Glucose, Bld: 124 mg/dL — ABNORMAL HIGH (ref 70–99)
Potassium: 3.9 mEq/L (ref 3.5–5.1)
Sodium: 139 mEq/L (ref 135–145)

## 2022-12-05 DIAGNOSIS — H353131 Nonexudative age-related macular degeneration, bilateral, early dry stage: Secondary | ICD-10-CM | POA: Diagnosis not present

## 2022-12-05 DIAGNOSIS — H5203 Hypermetropia, bilateral: Secondary | ICD-10-CM | POA: Diagnosis not present

## 2022-12-14 ENCOUNTER — Other Ambulatory Visit: Payer: Self-pay | Admitting: Internal Medicine

## 2022-12-14 ENCOUNTER — Ambulatory Visit: Payer: 59 | Admitting: Nurse Practitioner

## 2022-12-14 ENCOUNTER — Encounter: Payer: Self-pay | Admitting: Nurse Practitioner

## 2022-12-14 VITALS — BP 120/78 | HR 65 | Temp 98.1°F | Ht 63.0 in | Wt 192.2 lb

## 2022-12-14 DIAGNOSIS — G8929 Other chronic pain: Secondary | ICD-10-CM

## 2022-12-14 DIAGNOSIS — M5441 Lumbago with sciatica, right side: Secondary | ICD-10-CM | POA: Diagnosis not present

## 2022-12-14 MED ORDER — METHYLPREDNISOLONE 4 MG PO TBPK
ORAL_TABLET | ORAL | 0 refills | Status: DC
Start: 2022-12-14 — End: 2023-02-09

## 2022-12-14 NOTE — Progress Notes (Signed)
[name], NP-C Phone: [phone]  Melanie Fisher is a 57 y.o. female who presents today for back pain.   Back Pain  Patient with chronic back pain. There was not an injury that may have caused the pain. The pain is located in the middle lumbar area radiating into her right buttock and down her right leg. It is described as stabbing, and occurring constantly.  Aggravating factors: turning/rotation, laying on her right side, sitting and standing for long periods of time. Relieving factors: walking. She has tried application of heat, acetaminophen, TENs machine and prescription pain relievers with mild relief. She has been evaluated for this issue previously and treated with steroids which she reports were helpful. She has been referred to Physical Therapy but is not able to go due to her work schedule. She is open to seeing a specialist for further evaluation.   Associated symptoms: No abdominal pain No bowel incontinence  No chest pain No dysuria   No fever No headaches  No joint pains No pelvic pain  No weakness in leg  Yes tingling in lower extremities  No urinary incontinence No weight loss    Social History   Tobacco Use  Smoking Status Former   Packs/day: 0.50   Years: 21.00   Additional pack years: 0.00   Total pack years: 10.50   Types: Cigarettes   Quit date: 08/2020   Years since quitting: 2.3  Smokeless Tobacco Never    Current Outpatient Medications on File Prior to Visit  Medication Sig Dispense Refill   amLODipine (NORVASC) 10 MG tablet TAKE 1 TABLET(10 MG) BY MOUTH DAILY 90 tablet 1   diazepam (VALIUM) 5 MG tablet Take 1 tablet (5 mg total) by mouth at bedtime as needed for anxiety. 30 tablet 1   losartan (COZAAR) 100 MG tablet TAKE 1 TABLET(100 MG) BY MOUTH DAILY 90 tablet 1   metoprolol succinate (TOPROL-XL) 50 MG 24 hr tablet Take with or immediately following a meal. 90 tablet 1   omeprazole (PRILOSEC) 40 MG capsule TAKE 1 CAPSULE(40 MG) BY MOUTH DAILY  90 capsule 3   sertraline (ZOLOFT) 50 MG tablet Take 1 tablet (50 mg total) by mouth daily. 90 tablet 1   traMADol (ULTRAM) 50 MG tablet TAKE 1 TABLET(50 MG) BY MOUTH EVERY 12 HOURS AS NEEDED FOR MODERATE PAIN 60 tablet 5   No current facility-administered medications on file prior to visit.    ROS see history of present illness  Objective  Physical Exam Vitals:   12/14/22 1512  BP: 120/78  Pulse: 65  Temp: 98.1 F (36.7 C)  SpO2: 98%    BP Readings from Last 3 Encounters:  12/14/22 120/78  10/17/22 108/66  10/05/22 120/82   Wt Readings from Last 3 Encounters:  12/14/22 192 lb 3.2 oz (87.2 kg)  10/17/22 191 lb 3.2 oz (86.7 kg)  10/05/22 192 lb 6.4 oz (87.3 kg)    Physical Exam Constitutional:      General: She is not in acute distress.    Appearance: Normal appearance.  HENT:     Head: Normocephalic.  Cardiovascular:     Rate and Rhythm: Normal rate and regular rhythm.     Heart sounds: Normal heart sounds.  Pulmonary:     Effort: Pulmonary effort is normal.     Breath sounds: Normal breath sounds.  Musculoskeletal:        General: Normal range of motion.     Lumbar back: Tenderness (mild- midline) present. No swelling. Normal  range of motion.     Right hip: Normal. Normal range of motion.     Left hip: Normal. Normal range of motion.  Skin:    General: Skin is warm and dry.  Neurological:     General: No focal deficit present.     Mental Status: She is alert.  Psychiatric:        Mood and Affect: Mood normal.        Behavior: Behavior normal.    Assessment/Plan: Please see individual problem list.  Chronic bilateral low back pain with right-sided sciatica Assessment & Plan: Chronic issue for several years. Improvement in the past with steroid taper, will treat with MDP. She can continue her pain medication PRN, alternating ice and heat on painful areas and using her TENs machines. She can also use OTC pain relief creams and patches such as SalonPas and  IcyHot. Will refer to Physical Medicine and Rehab for further evaluation and treatment options.   Orders: -     methylPREDNISolone; Take as directed.  Dispense: 21 each; Refill: 0 -     Ambulatory referral to Pain Clinic   Return if symptoms worsen or fail to improve.   [name], NP-C [facility] [employer]

## 2022-12-22 NOTE — Assessment & Plan Note (Addendum)
Chronic issue for several years. Improvement in the past with steroid taper, will treat with MDP. She can continue her pain medication PRN, alternating ice and heat on painful areas and using her TENs machines. She can also use OTC pain relief creams and patches such as SalonPas and IcyHot. Will refer to Physical Medicine and Rehab for further evaluation and treatment options.

## 2023-01-03 ENCOUNTER — Encounter: Payer: Self-pay | Admitting: Physical Medicine & Rehabilitation

## 2023-01-05 ENCOUNTER — Encounter: Payer: Self-pay | Admitting: Physical Medicine & Rehabilitation

## 2023-01-05 ENCOUNTER — Encounter: Payer: 59 | Attending: Physical Medicine & Rehabilitation | Admitting: Physical Medicine & Rehabilitation

## 2023-01-05 VITALS — BP 108/70 | HR 55 | Ht 63.0 in | Wt 191.0 lb

## 2023-01-05 DIAGNOSIS — M79661 Pain in right lower leg: Secondary | ICD-10-CM

## 2023-01-05 DIAGNOSIS — M545 Low back pain, unspecified: Secondary | ICD-10-CM | POA: Diagnosis not present

## 2023-01-05 DIAGNOSIS — M5441 Lumbago with sciatica, right side: Secondary | ICD-10-CM | POA: Insufficient documentation

## 2023-01-05 DIAGNOSIS — G8929 Other chronic pain: Secondary | ICD-10-CM | POA: Diagnosis not present

## 2023-01-05 NOTE — Progress Notes (Signed)
Subjective:    Patient ID: Melanie Fisher, female    DOB: [birth year], 57 y.o.   MRN: [id number]  HPI 57 year old female kindly referred by primary care provider for chronic bilateral low back pain with right-sided sciatica. Patient states she has a 10-year history of low back pain but her right lower extremity pain has been for several months.  Her pain has moved to her right butt cheek about 4 weeks ago.  She has had no recent trauma.  She has had no weakness in her leg on the right side.  She did have some right lateral leg tingling.  She has a history of right knee osteoarthritis for the last 8 years.  She does not feel like her knee pain has gotten worse she feels like her pain in her lower extremity is coming from the back.  She has tried different types of analgesic creams she takes about 6 Tylenol per day.  Her PCP had her on Celebrex but because of kidney issues was taken off of this.  She does take about 2 tramadol per day.  She tries not to take medications when she is working however.  She also states that she has not had any physical therapy because this would not fit with her work schedule. Her pain is rated as severe and improves with heat.  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  Pain Inventory Average Pain 8 Pain Right Now 8 My pain is constant, sharp, and aching  In the last 24 hours, has pain interfered with the following? General activity 9 Relation with others 9 Enjoyment of life 9 What TIME of day is your pain at its worst? morning  and daytime Sleep (in general) Fair  Pain is worse with: bending, inactivity, standing, and some activites Pain improves with: rest, heat/ice, and medication Relief from Meds: 7  Mobility Walks without assistance  Walk about 30 minutes  Function Works as a Glass blower/designer 40 or more hours per week  Neuro / Psych Spasms Depression Anxiety  No recent Imaging    Family History  Problem Relation Age of Onset   Kidney disease  Mother 12       ESKD, partial neprhectomy   COPD Mother    Heart disease Father        CABG at age 48   Alcohol abuse Father        until late 55   Diabetes Father    Hypertension Father    Heart disease Sister 63       first MI at age 20   Diabetes Sister    Cancer Sister        uterine ca   Stroke Brother 5   Cancer Maternal Uncle 60       stage 4 colon CA   Colon cancer Maternal Uncle    Esophageal cancer Neg Hx    Pancreatic cancer Neg Hx    Rectal cancer Neg Hx    Stomach cancer Neg Hx    Breast cancer Neg Hx    Social History   Socioeconomic History   Marital status: Married    Spouse name: Not on file   Number of children: Not on file   Years of education: Not on file   Highest education level: Not on file  Occupational History   Occupation: [employer]  Tobacco Use   Smoking status: Former    Current packs/day: 0.00    Average packs/day: 0.5 packs/day for  21.0 years (10.5 ttl pk-yrs)    Types: Cigarettes    Start date: 08/1999    Quit date: 08/2020    Years since quitting: 2.3   Smokeless tobacco: Never  Vaping Use   Vaping status: Never Used  Substance and Sexual Activity   Alcohol use: Not Currently    Comment: quit drinking    Drug use: No   Sexual activity: Not Currently  Other Topics Concern   Not on file  Social History Narrative   Married as of 05/30/20   Former smoker quit 07/29/19 cigarettes    Social Determinants of Health   Financial Resource Strain: Unknown (01/28/2021)   Overall Financial Resource Strain (CARDIA)    Difficulty of Paying Living Expenses: Patient declined  Food Insecurity: Unknown (01/28/2021)   Hunger Vital Sign    Worried About Running Out of Food in the Last Year: Patient declined    Ran Out of Food in the Last Year: Patient declined  Transportation Needs: Unknown (01/28/2021)   PRAPARE - Transportation    Lack of Transportation (Medical): Patient declined    Lack of Transportation (Non-Medical): Patient declined   Physical Activity: Sufficiently Active (01/28/2021)   Exercise Vital Sign    Days of Exercise per Week: 3 days    Minutes of Exercise per Session: 50 min  Stress: Unknown (01/28/2021)   [employer] of Occupational Health - Occupational Stress Questionnaire    Feeling of Stress : Patient declined  Social Connections: Socially Integrated (01/28/2021)   Social Connection and Isolation Panel [NHANES]    Frequency of Communication with Friends and Family: More than three times a week    Frequency of Social Gatherings with Friends and Family: Three times a week    Attends Religious Services: 1 to 4 times per year    Active Member of Clubs or Organizations: Yes    Attends Banker Meetings: Patient declined    Marital Status: Married   Past Surgical History:  Procedure Laterality Date   ABDOMINAL HYSTERECTOMY  2008   partial   COLONOSCOPY WITH PROPOFOL N/A 06/04/2018   Procedure: COLONOSCOPY WITH PROPOFOL;  Surgeon: Wyline Mood, MD;  Location: [facility] ENDOSCOPY;  Service: Gastroenterology;  Laterality: N/A;   right toe bunionectomy     right wrist ganglion cyst removal     Past Surgical History:  Procedure Laterality Date   ABDOMINAL HYSTERECTOMY  2008   partial   COLONOSCOPY WITH PROPOFOL N/A 06/04/2018   Procedure: COLONOSCOPY WITH PROPOFOL;  Surgeon: Wyline Mood, MD;  Location: [facility] ENDOSCOPY;  Service: Gastroenterology;  Laterality: N/A;   right toe bunionectomy     right wrist ganglion cyst removal     Past Medical History:  Diagnosis Date   Allergy    Anxiety    Asthma    Depression    GERD (gastroesophageal reflux disease)    Heart murmur    Hyperlipidemia    Hypertension    Sleep apnea    Tobacco abuse    BP 108/70   Pulse (!) 55   Ht 5\' 3"  (1.6 m)   Wt 191 lb (86.6 kg)   SpO2 98%   BMI 33.83 kg/m   Opioid Risk Score:   Fall Risk Score:  `1  Depression screen [facility] 2/9     01/05/2023   10:40 AM 10/17/2022    3:49 PM 10/17/2022    3:48 PM  07/19/2022   11:06 AM 09/26/2021    8:27 AM 09/12/2021    4:22 PM  06/27/2021    2:08 PM  Depression screen PHQ 2/9  Decreased Interest 0 0 0 2 0 0 0  Down, Depressed, Hopeless 0 0 0 2 0 0 0  PHQ - 2 Score 0 0 0 4 0 0 0  Altered sleeping 0 0  2 1  0  Tired, decreased energy 0 0  3 0  0  Change in appetite 0 0  3 1  0  Feeling bad or failure about yourself  0 0  3 0  0  Trouble concentrating 0 0  2 0  0  Moving slowly or fidgety/restless 0 0  0 0  0  Suicidal thoughts 0 0  0 0  0  PHQ-9 Score 0 0  17 2  0  Difficult doing work/chores  Not difficult at all  Not difficult at all Not difficult at all  Not difficult at all    Review of Systems  Musculoskeletal:  Positive for back pain.       Right side back pain down to right leg spasms  Psychiatric/Behavioral:         Depression, Anxiety  All other systems reviewed and are negative.     Objective:   Physical Exam Vitals and nursing note reviewed.  Constitutional:      Appearance: She is obese.  HENT:     Head: Normocephalic and atraumatic.  Eyes:     Extraocular Movements: Extraocular movements intact.     Conjunctiva/sclera: Conjunctivae normal.     Pupils: Pupils are equal, round, and reactive to light.  Musculoskeletal:     Comments: Minor tenderness to palpation lumbar paraspinal area) left side.  There is limited lumbar range of motion she has good lumbar flexion near normal her lumbar extension is limited to 25% of normal as is lateral bending which is worse of the right side bend to the left side.  Skin:    General: Skin is warm and dry.  Neurological:     Mental Status: She is alert and oriented to person, place, and time.     Motor: No weakness or tremor.     Coordination: Coordination is intact.     Gait: Gait is intact.     Comments: Motor strength is 5/5 bilateral deltoid bicep tricep grip hip flexor knee extensor ankle dorsiflexor and plantar flexor Deep tendon reflexes are normal bilateral upper and lower  limbs Sensation normal bilateral upper and lower limbs  Psychiatric:        Mood and Affect: Mood normal.        Behavior: Behavior normal.    Pain with right hip internal rotation       Assessment & Plan:    1.  Chronic low back pain now with right lower extremity pain.  Her pain appears to be mainly lumbar facet mediated with increased extension she does get better with sitting.  Her neurologic examination is normal but she likely has some neurogenic claudication down the right lower extremity. We discussed that physical therapy would be helpful for her however she does not feel her work schedule would accommodate this.  Her lower extremity pain is more bothersome than her back pain which has been a longstanding issue.  Will trial gabapentin 100 mg 3 times daily may need to titrate upward from there.  Check hip x-rays as well as low back x-rays consider MRI if not better in a month

## 2023-01-12 ENCOUNTER — Ambulatory Visit
Admission: RE | Admit: 2023-01-12 | Discharge: 2023-01-12 | Disposition: A | Discharge status: Home / Self Care | Payer: 59 | Source: Ambulatory Visit | Attending: Physical Medicine & Rehabilitation | Admitting: Physical Medicine & Rehabilitation

## 2023-01-12 ENCOUNTER — Ambulatory Visit
Admission: RE | Admit: 2023-01-12 | Discharge: 2023-01-12 | Disposition: A | Discharge status: Home / Self Care | Payer: 59 | Attending: Physical Medicine & Rehabilitation | Admitting: Physical Medicine & Rehabilitation

## 2023-01-12 DIAGNOSIS — M5441 Lumbago with sciatica, right side: Secondary | ICD-10-CM | POA: Diagnosis not present

## 2023-01-12 DIAGNOSIS — M47816 Spondylosis without myelopathy or radiculopathy, lumbar region: Secondary | ICD-10-CM | POA: Diagnosis not present

## 2023-01-12 DIAGNOSIS — M5115 Intervertebral disc disorders with radiculopathy, thoracolumbar region: Secondary | ICD-10-CM | POA: Diagnosis not present

## 2023-01-12 DIAGNOSIS — M1611 Unilateral primary osteoarthritis, right hip: Secondary | ICD-10-CM | POA: Diagnosis not present

## 2023-01-12 DIAGNOSIS — M4317 Spondylolisthesis, lumbosacral region: Secondary | ICD-10-CM | POA: Diagnosis not present

## 2023-01-12 DIAGNOSIS — M5116 Intervertebral disc disorders with radiculopathy, lumbar region: Secondary | ICD-10-CM | POA: Diagnosis not present

## 2023-01-12 DIAGNOSIS — G8929 Other chronic pain: Secondary | ICD-10-CM | POA: Insufficient documentation

## 2023-01-12 DIAGNOSIS — M5430 Sciatica, unspecified side: Secondary | ICD-10-CM | POA: Diagnosis not present

## 2023-01-12 DIAGNOSIS — M545 Low back pain, unspecified: Secondary | ICD-10-CM | POA: Diagnosis not present

## 2023-01-15 ENCOUNTER — Telehealth: Payer: Self-pay

## 2023-01-15 MED ORDER — GABAPENTIN 300 MG PO CAPS: 300.0000 mg | ORAL_CAPSULE | Freq: Three times a day (TID) | ORAL | 1 refills | Status: DC

## 2023-01-15 NOTE — Telephone Encounter (Signed)
Melanie Fisher stated she was advised to call Dr.Kirsteins. If an increase of Gabapentin is needed. She said the current overall pain is at a level 8/10. Patient reported taking Tylenol, Tramadol, Bengay & Gabapentin.   Will you be increasing the Gabapentin?   Call back phone [phone].

## 2023-01-15 NOTE — Telephone Encounter (Signed)
Patient notified

## 2023-01-22 ENCOUNTER — Other Ambulatory Visit: Payer: Self-pay | Admitting: Internal Medicine

## 2023-01-22 ENCOUNTER — Other Ambulatory Visit (HOSPITAL_COMMUNITY): Payer: Self-pay

## 2023-01-22 ENCOUNTER — Telehealth: Payer: Self-pay

## 2023-01-22 ENCOUNTER — Telehealth: Payer: Self-pay | Admitting: Internal Medicine

## 2023-01-22 DIAGNOSIS — K219 Gastro-esophageal reflux disease without esophagitis: Secondary | ICD-10-CM

## 2023-01-22 NOTE — Telephone Encounter (Signed)
[facility] in [place] told patient she needs a Prior Auth for her omeprazole (PRILOSEC) 40 MG capsule.

## 2023-01-22 NOTE — Telephone Encounter (Signed)
Left a detailed message letting pt know that medication has been approved.

## 2023-01-22 NOTE — Telephone Encounter (Signed)
Pharmacy Patient Advocate Encounter  Received notification from [facility] that Prior Authorization for Omeprazole 40MG  dr capsules has been APPROVED from 01/22/23 to 01/22/24. Ran test claim, Copay is $20  PA #/Case ID/Reference #: [id number]

## 2023-01-22 NOTE — Telephone Encounter (Signed)
PA request has been Approved. New Encounter created for follow up. For additional info see Pharmacy Prior Auth telephone encounter from 01/22/23.

## 2023-01-24 ENCOUNTER — Other Ambulatory Visit: Payer: Self-pay | Admitting: Internal Medicine

## 2023-01-24 DIAGNOSIS — K219 Gastro-esophageal reflux disease without esophagitis: Secondary | ICD-10-CM

## 2023-01-26 MED ORDER — OMEPRAZOLE 40 MG PO CPDR
DELAYED_RELEASE_CAPSULE | ORAL | 3 refills | Status: DC
Start: 2023-01-26 — End: 2023-12-19

## 2023-01-26 NOTE — Addendum Note (Signed)
Addended by: Sandy Salaam on: 01/26/2023 01:05 PM   Modules accepted: Orders

## 2023-01-26 NOTE — Telephone Encounter (Signed)
Pt called in stating that pharmacy does not have her Omeprazole. Please advice.

## 2023-01-26 NOTE — Telephone Encounter (Signed)
LM letting pt know that medication has been refilled.  

## 2023-02-09 ENCOUNTER — Encounter: Payer: Self-pay | Admitting: Physical Medicine & Rehabilitation

## 2023-02-09 ENCOUNTER — Encounter: Payer: 59 | Attending: Physical Medicine & Rehabilitation | Admitting: Physical Medicine & Rehabilitation

## 2023-02-09 VITALS — BP 119/76 | HR 64 | Ht 63.0 in | Wt 196.2 lb

## 2023-02-09 DIAGNOSIS — M5416 Radiculopathy, lumbar region: Secondary | ICD-10-CM | POA: Insufficient documentation

## 2023-02-09 MED ORDER — GABAPENTIN 400 MG PO CAPS: 400.0000 mg | ORAL_CAPSULE | Freq: Four times a day (QID) | ORAL | 1 refills | Status: DC

## 2023-02-09 NOTE — Progress Notes (Signed)
Subjective:    Patient ID: Melanie Fisher, female    DOB: 04-Aug-1965, 57 y.o.   MRN: [id number] 57 year old female kindly referred by primary care provider for chronic bilateral low back pain with right-sided sciatica. Patient states she has a 10-year history of low back pain but her right lower extremity pain has been for several months.  Her pain has moved to her right butt cheek about 4 weeks ago.  She has had no recent trauma.  She has had no weakness in her leg on the right side.  She did have some right lateral leg tingling.  She has a history of right knee osteoarthritis for the last 8 years.  She does not feel like her knee pain has gotten worse she feels like her pain in her lower extremity is coming from the back.  She has tried different types of analgesic creams she takes about 6 Tylenol per day.  Her PCP had her on Celebrex but because of kidney issues was taken off of this.  She does take about 2 tramadol per day.  She tries not to take medications when she is working however.  She also states that she has not had any physical therapy because this would not fit with her work schedule. Her pain is rated as severe and improves with heat.  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 HPI  Patient has been doing her usual job as well as doing some gardening.  She did not do any focused back exercises.  She states her work schedule makes it difficult to go to physical therapy. Her pain diagram shows right groin pain as well as low back pain as well as right lateral leg pain.  She has sharp stabbing and aching pain.  Her pain interference scores are 2 out of 10 but her average pain is 8-9 out of 10.  Pain is well worse with walking bending sitting standing improves with rest and medications. Pain Inventory Average Pain 9 Pain Right Now 8 My pain is sharp, stabbing, and aching  In the last 24 hours, has pain interfered with the following? General activity 2 Relation with others  2 Enjoyment of life 2 What TIME of day is your pain at its worst? morning , daytime, evening, and night Sleep (in general) Fair  Pain is worse with: walking, bending, sitting, standing, and some activites Pain improves with: rest, heat/ice, medication, and TENS Relief from Meds:  not specified  Family History  Problem Relation Age of Onset   Kidney disease Mother 68       ESKD, partial neprhectomy   COPD Mother    Heart disease Father        CABG at age 3   Alcohol abuse Father        until late 43   Diabetes Father    Hypertension Father    Heart disease Sister 4       first MI at age 38   Diabetes Sister    Cancer Sister        uterine ca   Stroke Brother 61   Cancer Maternal Uncle 60       stage 4 colon CA   Colon cancer Maternal Uncle    Esophageal cancer Neg Hx    Pancreatic cancer Neg Hx    Rectal cancer Neg Hx    Stomach cancer Neg Hx    Breast cancer Neg Hx    Social History  Socioeconomic History   Marital status: Married    Spouse name: Not on file   Number of children: Not on file   Years of education: Not on file   Highest education level: Not on file  Occupational History   Occupation: [employer]  Tobacco Use   Smoking status: Former    Current packs/day: 0.00    Average packs/day: 0.5 packs/day for 21.0 years (10.5 ttl pk-yrs)    Types: Cigarettes    Start date: 08/1999    Quit date: 08/2020    Years since quitting: 2.4   Smokeless tobacco: Never  Vaping Use   Vaping status: Never Used  Substance and Sexual Activity   Alcohol use: Not Currently    Comment: quit drinking    Drug use: No   Sexual activity: Not Currently  Other Topics Concern   Not on file  Social History Narrative   Married as of 05/30/20   Former smoker quit 07/29/19 cigarettes    Social Determinants of Health   Financial Resource Strain: Unknown (01/28/2021)   Overall Financial Resource Strain (CARDIA)    Difficulty of Paying Living Expenses: Patient declined  Food  Insecurity: Unknown (01/28/2021)   Hunger Vital Sign    Worried About Running Out of Food in the Last Year: Patient declined    Ran Out of Food in the Last Year: Patient declined  Transportation Needs: Unknown (01/28/2021)   PRAPARE - Transportation    Lack of Transportation (Medical): Patient declined    Lack of Transportation (Non-Medical): Patient declined  Physical Activity: Sufficiently Active (01/28/2021)   Exercise Vital Sign    Days of Exercise per Week: 3 days    Minutes of Exercise per Session: 50 min  Stress: Unknown (01/28/2021)   [employer] of Occupational Health - Occupational Stress Questionnaire    Feeling of Stress : Patient declined  Social Connections: Socially Integrated (01/28/2021)   Social Connection and Isolation Panel [NHANES]    Frequency of Communication with Friends and Family: More than three times a week    Frequency of Social Gatherings with Friends and Family: Three times a week    Attends Religious Services: 1 to 4 times per year    Active Member of Clubs or Organizations: Yes    Attends Banker Meetings: Patient declined    Marital Status: Married   Past Surgical History:  Procedure Laterality Date   ABDOMINAL HYSTERECTOMY  2008   partial   COLONOSCOPY WITH PROPOFOL N/A 06/04/2018   Procedure: COLONOSCOPY WITH PROPOFOL;  Surgeon: Wyline Mood, MD;  Location: [facility] ENDOSCOPY;  Service: Gastroenterology;  Laterality: N/A;   right toe bunionectomy     right wrist ganglion cyst removal     Past Surgical History:  Procedure Laterality Date   ABDOMINAL HYSTERECTOMY  2008   partial   COLONOSCOPY WITH PROPOFOL N/A 06/04/2018   Procedure: COLONOSCOPY WITH PROPOFOL;  Surgeon: Wyline Mood, MD;  Location: [facility] ENDOSCOPY;  Service: Gastroenterology;  Laterality: N/A;   right toe bunionectomy     right wrist ganglion cyst removal     Past Medical History:  Diagnosis Date   Allergy    Anxiety    Asthma    Depression    GERD  (gastroesophageal reflux disease)    Heart murmur    Hyperlipidemia    Hypertension    Sleep apnea    Tobacco abuse    BP 119/76   Pulse 64   Ht 5\' 3"  (1.6 m)   Wt 196  lb 3.2 oz (89 kg)   SpO2 98%   BMI 34.76 kg/m   Opioid Risk Score:   Fall Risk Score:  `1  Depression screen PHQ 2/9     02/09/2023    1:48 PM 01/05/2023   10:40 AM 10/17/2022    3:49 PM 10/17/2022    3:48 PM 07/19/2022   11:06 AM 09/26/2021    8:27 AM 09/12/2021    4:22 PM  Depression screen PHQ 2/9  Decreased Interest 1 0 0 0 2 0 0  Down, Depressed, Hopeless 1 0 0 0 2 0 0  PHQ - 2 Score 2 0 0 0 4 0 0  Altered sleeping  0 0  2 1   Tired, decreased energy  0 0  3 0   Change in appetite  0 0  3 1   Feeling bad or failure about yourself   0 0  3 0   Trouble concentrating  0 0  2 0   Moving slowly or fidgety/restless  0 0  0 0   Suicidal thoughts  0 0  0 0   PHQ-9 Score  0 0  17 2   Difficult doing work/chores   Not difficult at all  Not difficult at all Not difficult at all      Review of Systems  Constitutional: Negative.   HENT: Negative.    Eyes: Negative.   Respiratory: Negative.    Cardiovascular: Negative.   Gastrointestinal: Negative.   Endocrine: Negative.   Genitourinary: Negative.   Musculoskeletal:  Positive for back pain.       Left leg  Skin: Negative.   Allergic/Immunologic: Negative.   Neurological: Negative.   Hematological: Negative.   Psychiatric/Behavioral:  Positive for dysphoric mood.   All other systems reviewed and are negative.      Objective:   Physical Exam Vitals and nursing note reviewed.  Constitutional:      Appearance: She is obese.  HENT:     Head: Normocephalic and atraumatic.  Eyes:     Extraocular Movements: Extraocular movements intact.     Conjunctiva/sclera: Conjunctivae normal.     Pupils: Pupils are equal, round, and reactive to light.  Musculoskeletal:     Right lower leg: No edema.     Left lower leg: No edema.  Skin:    General: Skin is  warm and dry.  Neurological:     Mental Status: She is alert and oriented to person, place, and time.  Psychiatric:        Mood and Affect: Mood normal.        Behavior: Behavior normal.      Right L4 lat leg numbness to pinprick  LE MMT wnl, 5/5 bilateral hip flexor knee extensor ankle dorsiflexor and plantar flexor Negative straight leg raising bilaterally Ambulates without assistive device no evidence of toe drag or knee instability    Assessment & Plan:  1.  Low back and right lower extremity pain has been persistent for greater than 6 weeks, lumbar spine x-rays demonstrate L4-5 L5-S1 facet arthropathy.  There is also anterior listhesis likely from degenerative facet arthropathy at L5-S1.  Suspect she may have some foraminal stenosis on the right side at L4 5 L5-S1 but we will need lumbar MRI to further evaluate.  Right hip x-rays only demonstrate mild degenerative changes.  Do not think that that is a pain generator for this problem

## 2023-02-15 ENCOUNTER — Telehealth: Payer: Self-pay

## 2023-02-15 NOTE — Telephone Encounter (Signed)
Patient called stating that MRI was denied but symptoms is worse. Symptoms are burning going down the front of right leg, pain across lower back into buttocks. Please advise.

## 2023-02-15 NOTE — Telephone Encounter (Signed)
I looked up the autho and it is still pending not denied.

## 2023-02-16 ENCOUNTER — Ambulatory Visit: Payer: 59

## 2023-02-23 ENCOUNTER — Ambulatory Visit: Payer: 59

## 2023-03-15 ENCOUNTER — Ambulatory Visit: Payer: Self-pay | Admitting: Nurse Practitioner

## 2023-03-16 ENCOUNTER — Encounter: Payer: 59 | Admitting: Physical Medicine & Rehabilitation

## 2023-03-16 NOTE — Progress Notes (Unsigned)
Subjective:    Patient ID: Melanie Fisher, female    DOB: 1966/04/25, 57 y.o.   MRN: [id number]  HPI Pain Inventory Average Pain 9 Pain Right Now 8 My pain is intermittent, sharp, burning, stabbing, tingling, and aching  In the last 24 hours, has pain interfered with the following? General activity 7 Relation with others 3 Enjoyment of life 9 What TIME of day is your pain at its worst? varies Sleep (in general) Fair  Pain is worse with: walking, bending, sitting, standing, and some activites Pain improves with: heat/ice and medication Relief from Meds: 5  Family History  Problem Relation Age of Onset  . Kidney disease Mother 47       ESKD, partial neprhectomy  . COPD Mother   . Heart disease Father        CABG at age 30  . Alcohol abuse Father        until late 53  . Diabetes Father   . Hypertension Father   . Heart disease Sister 72       first MI at age 78  . Diabetes Sister   . Cancer Sister        uterine ca  . Stroke Brother 78  . Cancer Maternal Uncle 60       stage 4 colon CA  . Colon cancer Maternal Uncle   . Esophageal cancer Neg Hx   . Pancreatic cancer Neg Hx   . Rectal cancer Neg Hx   . Stomach cancer Neg Hx   . Breast cancer Neg Hx    Social History   Socioeconomic History  . Marital status: Married    Spouse name: Not on file  . Number of children: Not on file  . Years of education: Not on file  . Highest education level: Not on file  Occupational History  . Occupation: [employer]  Tobacco Use  . Smoking status: Former    Current packs/day: 0.00    Average packs/day: 0.5 packs/day for 21.0 years (10.5 ttl pk-yrs)    Types: Cigarettes    Start date: 08/1999    Quit date: 08/2020    Years since quitting: 2.5  . Smokeless tobacco: Never  Vaping Use  . Vaping status: Never Used  Substance and Sexual Activity  . Alcohol use: Not Currently    Comment: quit drinking   . Drug use: No  . Sexual activity: Not Currently  Other Topics  Concern  . Not on file  Social History Narrative   Married as of 05/30/20   Former smoker quit 07/29/19 cigarettes    Social Determinants of Health   Financial Resource Strain: Unknown (01/28/2021)   Overall Financial Resource Strain (CARDIA)   . Difficulty of Paying Living Expenses: Patient declined  Food Insecurity: Unknown (01/28/2021)   Hunger Vital Sign   . Worried About Programme researcher, broadcasting/film/video in the Last Year: Patient declined   . Ran Out of Food in the Last Year: Patient declined  Transportation Needs: Unknown (01/28/2021)   PRAPARE - Transportation   . Lack of Transportation (Medical): Patient declined   . Lack of Transportation (Non-Medical): Patient declined  Physical Activity: Sufficiently Active (01/28/2021)   Exercise Vital Sign   . Days of Exercise per Week: 3 days   . Minutes of Exercise per Session: 50 min  Stress: Unknown (01/28/2021)   [employer] of Occupational Health - Occupational Stress Questionnaire   . Feeling of Stress : Patient declined  Social Connections: Socially  Integrated (01/28/2021)   Social Connection and Isolation Panel [NHANES]   . Frequency of Communication with Friends and Family: More than three times a week   . Frequency of Social Gatherings with Friends and Family: Three times a week   . Attends Religious Services: 1 to 4 times per year   . Active Member of Clubs or Organizations: Yes   . Attends Banker Meetings: Patient declined   . Marital Status: Married   Past Surgical History:  Procedure Laterality Date  . ABDOMINAL HYSTERECTOMY  2008   partial  . COLONOSCOPY WITH PROPOFOL N/A 06/04/2018   Procedure: COLONOSCOPY WITH PROPOFOL;  Surgeon: Wyline Mood, MD;  Location: [facility] ENDOSCOPY;  Service: Gastroenterology;  Laterality: N/A;  . right toe bunionectomy    . right wrist ganglion cyst removal     Past Surgical History:  Procedure Laterality Date  . ABDOMINAL HYSTERECTOMY  2008   partial  . COLONOSCOPY WITH PROPOFOL  N/A 06/04/2018   Procedure: COLONOSCOPY WITH PROPOFOL;  Surgeon: Wyline Mood, MD;  Location: [facility] ENDOSCOPY;  Service: Gastroenterology;  Laterality: N/A;  . right toe bunionectomy    . right wrist ganglion cyst removal     Past Medical History:  Diagnosis Date  . Allergy   . Anxiety   . Asthma   . Depression   . GERD (gastroesophageal reflux disease)   . Heart murmur   . Hyperlipidemia   . Hypertension   . Sleep apnea   . Tobacco abuse    There were no vitals taken for this visit.  Opioid Risk Score:   Fall Risk Score:  `1  Depression screen PHQ 2/9     02/09/2023    1:48 PM 01/05/2023   10:40 AM 10/17/2022    3:49 PM 10/17/2022    3:48 PM 07/19/2022   11:06 AM 09/26/2021    8:27 AM 09/12/2021    4:22 PM  Depression screen PHQ 2/9  Decreased Interest 1 0 0 0 2 0 0  Down, Depressed, Hopeless 1 0 0 0 2 0 0  PHQ - 2 Score 2 0 0 0 4 0 0  Altered sleeping  0 0  2 1   Tired, decreased energy  0 0  3 0   Change in appetite  0 0  3 1   Feeling bad or failure about yourself   0 0  3 0   Trouble concentrating  0 0  2 0   Moving slowly or fidgety/restless  0 0  0 0   Suicidal thoughts  0 0  0 0   PHQ-9 Score  0 0  17 2   Difficult doing work/chores   Not difficult at all  Not difficult at all Not difficult at all       Review of Systems  Musculoskeletal:  Positive for back pain and gait problem.      Objective:   Physical Exam        Assessment & Plan:

## 2023-03-20 NOTE — Addendum Note (Signed)
Addended by: Erick Colace on: 03/20/2023 03:53 PM   Modules accepted: Orders

## 2023-03-22 ENCOUNTER — Encounter: Payer: 59 | Attending: Physical Medicine & Rehabilitation | Admitting: Physical Medicine & Rehabilitation

## 2023-03-22 ENCOUNTER — Encounter: Payer: Self-pay | Admitting: Physical Medicine & Rehabilitation

## 2023-03-22 VITALS — BP 109/71 | HR 56 | Ht 63.0 in | Wt 196.0 lb

## 2023-03-22 DIAGNOSIS — M5416 Radiculopathy, lumbar region: Secondary | ICD-10-CM | POA: Insufficient documentation

## 2023-03-22 DIAGNOSIS — M5441 Lumbago with sciatica, right side: Secondary | ICD-10-CM | POA: Insufficient documentation

## 2023-03-22 DIAGNOSIS — G8929 Other chronic pain: Secondary | ICD-10-CM | POA: Diagnosis not present

## 2023-03-22 DIAGNOSIS — M533 Sacrococcygeal disorders, not elsewhere classified: Secondary | ICD-10-CM | POA: Insufficient documentation

## 2023-03-22 MED ORDER — GABAPENTIN 300 MG PO CAPS: 600.0000 mg | ORAL_CAPSULE | Freq: Three times a day (TID) | ORAL | 1 refills | Status: DC

## 2023-03-22 NOTE — Progress Notes (Signed)
Subjective:    Patient ID: Melanie Fisher, female    DOB: 05-May-1966, 57 y.o.   MRN: [id number]  HPI  57 year old female with chronic lumbar pain who is complaining of increasing right lower extremity pain.  She was seen in initial evaluation about 6 weeks ago.  Time she has some numbness in the right lateral thigh with concerns for lumbar radiculopathy.  MRI was ordered but denied by insurance because patient has not gone through physical therapy.  The patient is scheduled for therapy next week. Pain Inventory Average Pain 9 Pain Right Now 10 My pain is sharp, burning, stabbing, and tingling  In the last 24 hours, has pain interfered with the following? General activity 8 Relation with others 9 Enjoyment of life 9 What TIME of day is your pain at its worst? varies Sleep (in general) Fair  Pain is worse with: walking, bending, sitting, inactivity, and standing Pain improves with: rest, heat/ice, and TENS Relief from Meds: 5  Family History  Problem Relation Age of Onset   Kidney disease Mother 18       ESKD, partial neprhectomy   COPD Mother    Heart disease Father        CABG at age 52   Alcohol abuse Father        until late 72   Diabetes Father    Hypertension Father    Heart disease Sister 70       first MI at age 27   Diabetes Sister    Cancer Sister        uterine ca   Stroke Brother 36   Cancer Maternal Uncle 60       stage 4 colon CA   Colon cancer Maternal Uncle    Esophageal cancer Neg Hx    Pancreatic cancer Neg Hx    Rectal cancer Neg Hx    Stomach cancer Neg Hx    Breast cancer Neg Hx    Social History   Socioeconomic History   Marital status: Married    Spouse name: Not on file   Number of children: Not on file   Years of education: Not on file   Highest education level: Not on file  Occupational History   Occupation: [employer]  Tobacco Use   Smoking status: Former    Current packs/day: 0.00    Average packs/day: 0.5 packs/day for 21.0  years (10.5 ttl pk-yrs)    Types: Cigarettes    Start date: 08/1999    Quit date: 08/2020    Years since quitting: 2.5   Smokeless tobacco: Never  Vaping Use   Vaping status: Never Used  Substance and Sexual Activity   Alcohol use: Not Currently    Comment: quit drinking    Drug use: No   Sexual activity: Not Currently  Other Topics Concern   Not on file  Social History Narrative   Married as of 05/30/20   Former smoker quit 07/29/19 cigarettes    Social Determinants of Health   Financial Resource Strain: Unknown (01/28/2021)   Overall Financial Resource Strain (CARDIA)    Difficulty of Paying Living Expenses: Patient declined  Food Insecurity: Unknown (01/28/2021)   Hunger Vital Sign    Worried About Running Out of Food in the Last Year: Patient declined    Ran Out of Food in the Last Year: Patient declined  Transportation Needs: Unknown (01/28/2021)   PRAPARE - Administrator, Civil Service (Medical): Patient declined  Lack of Transportation (Non-Medical): Patient declined  Physical Activity: Sufficiently Active (01/28/2021)   Exercise Vital Sign    Days of Exercise per Week: 3 days    Minutes of Exercise per Session: 50 min  Stress: Unknown (01/28/2021)   [employer] of Occupational Health - Occupational Stress Questionnaire    Feeling of Stress : Patient declined  Social Connections: Socially Integrated (01/28/2021)   Social Connection and Isolation Panel [NHANES]    Frequency of Communication with Friends and Family: More than three times a week    Frequency of Social Gatherings with Friends and Family: Three times a week    Attends Religious Services: 1 to 4 times per year    Active Member of Clubs or Organizations: Yes    Attends Banker Meetings: Patient declined    Marital Status: Married   Past Surgical History:  Procedure Laterality Date   ABDOMINAL HYSTERECTOMY  2008   partial   COLONOSCOPY WITH PROPOFOL N/A 06/04/2018    Procedure: COLONOSCOPY WITH PROPOFOL;  Surgeon: Wyline Mood, MD;  Location: [facility] ENDOSCOPY;  Service: Gastroenterology;  Laterality: N/A;   right toe bunionectomy     right wrist ganglion cyst removal     Past Surgical History:  Procedure Laterality Date   ABDOMINAL HYSTERECTOMY  2008   partial   COLONOSCOPY WITH PROPOFOL N/A 06/04/2018   Procedure: COLONOSCOPY WITH PROPOFOL;  Surgeon: Wyline Mood, MD;  Location: [facility] ENDOSCOPY;  Service: Gastroenterology;  Laterality: N/A;   right toe bunionectomy     right wrist ganglion cyst removal     Past Medical History:  Diagnosis Date   Allergy    Anxiety    Asthma    Depression    GERD (gastroesophageal reflux disease)    Heart murmur    Hyperlipidemia    Hypertension    Sleep apnea    Tobacco abuse    There were no vitals taken for this visit.  Opioid Risk Score:   Fall Risk Score:  `1  Depression screen [facility] 2/9     03/16/2023   11:19 AM 02/09/2023    1:48 PM 01/05/2023   10:40 AM 10/17/2022    3:49 PM 10/17/2022    3:48 PM 07/19/2022   11:06 AM 09/26/2021    8:27 AM  Depression screen PHQ 2/9  Decreased Interest 0 1 0 0 0 2 0  Down, Depressed, Hopeless 0 1 0 0 0 2 0  PHQ - 2 Score 0 2 0 0 0 4 0  Altered sleeping   0 0  2 1  Tired, decreased energy   0 0  3 0  Change in appetite   0 0  3 1  Feeling bad or failure about yourself    0 0  3 0  Trouble concentrating   0 0  2 0  Moving slowly or fidgety/restless   0 0  0 0  Suicidal thoughts   0 0  0 0  PHQ-9 Score   0 0  17 2  Difficult doing work/chores    Not difficult at all  Not difficult at all Not difficult at all      Review of Systems  Musculoskeletal:  Positive for back pain and gait problem.  All other systems reviewed and are negative.     Objective:   Physical Exam  General No acute distress Mood and affect are appropriate Strength is 5/5 bilateral hip flexor knee extensor ankle dorsiflexor Sensation normal to light touch bilateral  L2-3 for 5 dermatome  distribution Ambulates without assistive device no evidence of toe drag or knee instability.  Sacral thrust (prone) : Positive Lateral compression: Negative FABER's: Positive Distraction (supine): Positive       Assessment & Plan:  1.  Chronic low back pain now with more right lower extremity pain.  She has pain that suggest sciatic involvement however sacroiliac signs are also positive.  Will send her through some physical therapy have her come back in a month.  If no improvement, need lumbar imaging to further isolate diagnosis.  Also may need diagnostic/therapeutic injections of the lumbar spine if no improvement with PT

## 2023-03-26 ENCOUNTER — Ambulatory Visit: Payer: 59 | Attending: Physical Medicine & Rehabilitation | Admitting: Physical Therapy

## 2023-03-26 DIAGNOSIS — R2681 Unsteadiness on feet: Secondary | ICD-10-CM | POA: Insufficient documentation

## 2023-03-26 DIAGNOSIS — M5416 Radiculopathy, lumbar region: Secondary | ICD-10-CM | POA: Diagnosis not present

## 2023-03-26 DIAGNOSIS — R269 Unspecified abnormalities of gait and mobility: Secondary | ICD-10-CM | POA: Insufficient documentation

## 2023-03-26 DIAGNOSIS — R2689 Other abnormalities of gait and mobility: Secondary | ICD-10-CM | POA: Insufficient documentation

## 2023-03-26 DIAGNOSIS — M6281 Muscle weakness (generalized): Secondary | ICD-10-CM | POA: Diagnosis not present

## 2023-03-26 DIAGNOSIS — R262 Difficulty in walking, not elsewhere classified: Secondary | ICD-10-CM | POA: Diagnosis not present

## 2023-03-26 DIAGNOSIS — M5459 Other low back pain: Secondary | ICD-10-CM | POA: Diagnosis not present

## 2023-03-26 NOTE — Therapy (Signed)
OUTPATIENT PHYSICAL THERAPY THORACOLUMBAR EVALUATION   Patient Name: Melanie Fisher MRN: [id number] DOB:April 16, 1966, 57 y.o., female Today's Date: 03/27/2023  END OF SESSION:  PT End of Session - 03/27/23 0812     Visit Number 1    Number of Visits 12    Date for PT Re-Evaluation 05/07/23    PT Start Time 1615    PT Stop Time 1659    PT Time Calculation (min) 44 min    Activity Tolerance Patient limited by pain   back went into spasm when transitioning to supine so no tests or measures were able to be taken in this position.            Past Medical History:  Diagnosis Date   Allergy    Anxiety    Asthma    Depression    GERD (gastroesophageal reflux disease)    Heart murmur    Hyperlipidemia    Hypertension    Sleep apnea    Tobacco abuse    Past Surgical History:  Procedure Laterality Date   ABDOMINAL HYSTERECTOMY  2008   partial   COLONOSCOPY WITH PROPOFOL N/A 06/04/2018   Procedure: COLONOSCOPY WITH PROPOFOL;  Surgeon: Wyline Mood, MD;  Location: [facility] ENDOSCOPY;  Service: Gastroenterology;  Laterality: N/A;   right toe bunionectomy     right wrist ganglion cyst removal     Patient Active Problem List   Diagnosis Date Noted   Acute renal failure (ARF) (HCC) 10/17/2022   Chronic bilateral low back pain with right-sided sciatica 10/05/2022   OSA (obstructive sleep apnea) 04/06/2021   Prediabetes 12/25/2020   Herpes simplex infection 05/28/2019   Widowed 05/21/2018   Tubular adenoma of colon 05/21/2018   B12 deficiency 05/21/2018   Insomnia due to anxiety and fear 04/10/2015   Polyneuropathy 02/10/2015   Essential hypertension 08/19/2014   Degenerative disc disease, thoracic 03/23/2014   Nonallopathic lesion of thoracic region 03/23/2014   Nonallopathic lesion-rib cage 03/23/2014   History of colonic polyps 02/11/2014   Obesity 07/01/2013   History of tobacco abuse 03/15/2013   Encounter for preventive health examination 09/04/2012   COPD (chronic  obstructive pulmonary disease) (HCC) 05/26/2012   Chronic shoulder pain 04/09/2012   Generalized anxiety disorder 04/06/2012   Depression with anxiety    Heart murmur    Hyperlipidemia    Allergic rhinitis due to grass pollen     PCP:    Sherlene Shams, MD    REFERRING PROVIDER:    Erick Colace, MD    REFERRING DIAG: M54.16 (ICD-10-CM) - Right lumbar radiculitis   Rationale for Evaluation and Treatment: Rehabilitation  THERAPY DIAG:  Other low back pain  Muscle weakness (generalized)  Abnormality of gait and mobility  ONSET DATE: 03/19/2013   SUBJECTIVE:  SUBJECTIVE STATEMENT: Pt reports she has had progressing back pain for about 10 years. She reports she has DDD and has been on and off several medications to help with her pain. Pt has just been taking tramadol and tylenol recently. Pt noticed her pain worsened to a point where any position became uncomfortable. Pt reports a burning and tingling sensation that goes down her leg. Pt has not had PT for her back in the past but had it for her knee with some success. Heat pad and tens have been helpful but is only temporary relief. Pt reports no other aches or pains outside of her back.   PERTINENT HISTORY:  HLD, HTN, long history of low back pain.   Pt reports she has had progressing back pain for about 10 years. She reports she has DDD and has been on and off several medications to help with her pain. Pt has just been taking tramadol and tylenol recently. Pt noticed her pain worsened to a point where any position became uncomfortable. Pt reports a burning and tingling sensation that goes down her leg. Pt has not had PT for her back in the past but had it for her knee with some success. Heat pad and tens have been helpful but is only  temporary relief. Pt reports no other aches or pains outside of her back.   PAIN:  Are you having pain? Yes: NPRS scale: 8/10 Pain location: Low back, right side  Pain description: Sharp Aggravating factors: prolonged positioning  Relieving factors: heat, tens ( temporary)  PRECAUTIONS: None  RED FLAGS: None   WEIGHT BEARING RESTRICTIONS: No  FALLS:  Has patient fallen in last 6 months? 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 Bending and twisting are a part of her job. 7-4 M-F with some 1/2 days on Fridays.   PLOF: Independent  PATIENT GOALS: To improve her back pain.   NEXT MD VISIT: 04/22/23  OBJECTIVE:  Note: Objective measures were completed at Evaluation unless otherwise noted.  DIAGNOSTIC FINDINGS:  Impression for DGI lumbar from 7/26:  IMPRESSION: 1. Grade 1 anterolisthesis of L5 on S1, likely degenerative. 2. L4-L5 and L5-S1 facet hypertrophy. 3. Multilevel spondylosis with spurring, mild disc space narrowing in the upper lumbar spine.    PATIENT SURVEYS:  Modified Oswestry 40%  FOTO 51  COGNITION: Overall cognitive status: Within functional limits for tasks assessed     SENSATION: WFL   PALPATION: R lateral lumbar paraspinals, unable to palpate gluteal sensitivity or piriformis sensitivity secondary to positioning challenges, pt unable to lye in supine without moaning in pain in clinic.   LUMBAR ROM:   AROM eval  Flexion Hands to mid thigh  Extension No active extension without pain  Right lateral flexion Hands to mid thigh  Left lateral flexion Hands to mid thigh  Right rotation Limited due to pain  Left rotation Limited due to pain   (Blank rows = not tested)     (Blank rows = not tested)  LOWER EXTREMITY MMT:    MMT Right eval Left eval  Hip flexion 4 4  Hip extension    Hip abduction 4 4  Hip adduction 4 4  Hip internal rotation    Hip  external rotation    Knee flexion 4+ 4+  Knee extension 4+ 4+  Ankle dorsiflexion    Ankle plantarflexion    Ankle inversion    Ankle eversion     (Blank rows = not tested)  LUMBAR SPECIAL TESTS:  Slump test: Negative  FUNCTIONAL TESTS:  5 times sit to stand: 31.49 sec   GAIT: Distance walked: 10 M Assistive device utilized: None Level of assistance: Complete Independence Comments: slight hip drop bilaterally  TODAY'S TREATMENT:                                                                                                                              Eval only     PATIENT EDUCATION:  Education details: POC Person educated: Patient Education method: Explanation Education comprehension: verbalized understanding   HOME EXERCISE PROGRAM: Establish visit 2    ASSESSMENT:  CLINICAL IMPRESSION: Patient is a 57 y.o. F who was seen today for physical therapy evaluation and treatment for low back pain and radiculopathy.   OBJECTIVE IMPAIRMENTS: Abnormal gait, decreased activity tolerance, decreased mobility, decreased ROM, decreased strength, impaired perceived functional ability, increased muscle spasms, impaired flexibility, postural dysfunction, and pain.   ACTIVITY LIMITATIONS: carrying, lifting, bending, sitting, standing, squatting, sleeping, and locomotion level  PARTICIPATION LIMITATIONS: cleaning, laundry, driving, shopping, community activity, occupation, and yard work  PERSONAL FACTORS: Fitness, Profession, Time since onset of injury/illness/exacerbation, and 1-2 comorbidities: HTN, HLD  are also affecting patient's functional outcome.   REHAB POTENTIAL: Fair time since onset, fitness level  CLINICAL DECISION MAKING: Stable/uncomplicated  EVALUATION COMPLEXITY:  Low   GOALS: Goals reviewed with patient? Yes  SHORT TERM GOALS: Target date: 04/16/2023       Patient will be independent in home exercise program to improve strength/mobility for better functional independence with ADLs. Baseline: No HEP currently  Goal status: INITIAL   LONG TERM GOALS: Target date: 05/07/2023     Pt will improve FOTO score to 58 or greater in order to indicate clinically significant improvement in overall function as a result of her back limitations  Baseline: 51 Goal status: INITIAL  2.  Pt will improve MODI  score to 10/50 or less in order to indicate clinically significant improvement in overall function as a result of her back limitations  Baseline: 20/50 Goal status: INITIAL  3.  Pt will improve 5X STS to 18 sec or less in order to indicate improved function with transfers and improved LE strength and power.  Baseline: 32.49 sec  Goal status: INITIAL  4.  Patient will perform lumbar flexion, sidebending, and rotation, to within the tolerance of her spinal mobility with no reports of increased pain to allow for improved functional mobility Baseline: Significant pain with all of the above movements with significant limitation secondary to pain Goal status: INITIAL   PLAN:  PT FREQUENCY: 1-2x/week  PT DURATION: 6 weeks  PLANNED INTERVENTIONS: Therapeutic exercises, Therapeutic activity, Neuromuscular re-education, Balance training, Gait training,  Patient/Family education, Self Care, Joint mobilization, Joint manipulation, Dry Needling, Spinal manipulation, Spinal mobilization, Moist heat, and Manual therapy.  PLAN FOR NEXT SESSION: Initiate HEP, practice transitioning to supine if applicable with focus on maintaining bent knee posture throughout to prevent onset of low back pain.  Consider seated there ex if appropriate.   Norman Herrlich, PT 03/27/2023, 8:34 AM

## 2023-03-28 ENCOUNTER — Ambulatory Visit: Payer: 59

## 2023-03-28 DIAGNOSIS — R2689 Other abnormalities of gait and mobility: Secondary | ICD-10-CM | POA: Diagnosis not present

## 2023-03-28 DIAGNOSIS — R269 Unspecified abnormalities of gait and mobility: Secondary | ICD-10-CM | POA: Diagnosis not present

## 2023-03-28 DIAGNOSIS — R262 Difficulty in walking, not elsewhere classified: Secondary | ICD-10-CM | POA: Diagnosis not present

## 2023-03-28 DIAGNOSIS — M5459 Other low back pain: Secondary | ICD-10-CM | POA: Diagnosis not present

## 2023-03-28 DIAGNOSIS — R2681 Unsteadiness on feet: Secondary | ICD-10-CM | POA: Diagnosis not present

## 2023-03-28 DIAGNOSIS — M6281 Muscle weakness (generalized): Secondary | ICD-10-CM | POA: Diagnosis not present

## 2023-03-28 DIAGNOSIS — M5416 Radiculopathy, lumbar region: Secondary | ICD-10-CM | POA: Diagnosis not present

## 2023-03-28 NOTE — Therapy (Signed)
OUTPATIENT PHYSICAL THERAPY THORACOLUMBAR TREATMENT   Patient Name: Melanie Fisher MRN: [id number] DOB:[birth year], 57 y.o., female Today's Date: 03/29/2023  END OF SESSION:  PT End of Session - 03/28/23 1611     Visit Number 2    Number of Visits 12    Date for PT Re-Evaluation 05/07/23    PT Start Time 1618    PT Stop Time 1701    PT Time Calculation (min) 43 min    Activity Tolerance Patient limited by pain   back went into spasm when transitioning to supine so no tests or measures were able to be taken in this position.            Past Medical History:  Diagnosis Date   Allergy    Anxiety    Asthma    Depression    GERD (gastroesophageal reflux disease)    Heart murmur    Hyperlipidemia    Hypertension    Sleep apnea    Tobacco abuse    Past Surgical History:  Procedure Laterality Date   ABDOMINAL HYSTERECTOMY  2008   partial   COLONOSCOPY WITH PROPOFOL N/A 06/04/2018   Procedure: COLONOSCOPY WITH PROPOFOL;  Surgeon: Wyline Mood, MD;  Location: [facility] ENDOSCOPY;  Service: Gastroenterology;  Laterality: N/A;   right toe bunionectomy     right wrist ganglion cyst removal     Patient Active Problem List   Diagnosis Date Noted   Acute renal failure (ARF) (HCC) 10/17/2022   Chronic bilateral low back pain with right-sided sciatica 10/05/2022   OSA (obstructive sleep apnea) 04/06/2021   Prediabetes 12/25/2020   Herpes simplex infection 05/28/2019   Widowed 05/21/2018   Tubular adenoma of colon 05/21/2018   B12 deficiency 05/21/2018   Insomnia due to anxiety and fear 04/10/2015   Polyneuropathy 02/10/2015   Essential hypertension 08/19/2014   Degenerative disc disease, thoracic 03/23/2014   Nonallopathic lesion of thoracic region 03/23/2014   Nonallopathic lesion-rib cage 03/23/2014   History of colonic polyps 02/11/2014   Obesity 07/01/2013   History of tobacco abuse 03/15/2013   Encounter for preventive health examination 09/04/2012   COPD (chronic  obstructive pulmonary disease) (HCC) 05/26/2012   Chronic shoulder pain 04/09/2012   Generalized anxiety disorder 04/06/2012   Depression with anxiety    Heart murmur    Hyperlipidemia    Allergic rhinitis due to grass pollen     PCP:    Sherlene Shams, MD    REFERRING PROVIDER:    Erick Colace, MD    REFERRING DIAG: M54.16 (ICD-10-CM) - Right lumbar radiculitis   Rationale for Evaluation and Treatment: Rehabilitation  THERAPY DIAG:  Muscle weakness (generalized)  Other low back pain  Other abnormalities of gait and mobility  ONSET DATE: 03/19/2013   SUBJECTIVE:  SUBJECTIVE STATEMENT: Pt reports she is feeling tired and rates pain as 8/10. Pt reports following testing pain was 10/10 and she needed to use a heating pad to decrease pain.  Pt reports she naps in a recliner because it is more comfortable. At night she'll sleep and lay on her L side.  PERTINENT HISTORY:  HLD, HTN, long history of low back pain.   Pt reports she has had progressing back pain for about 10 years. She reports she has DDD and has been on and off several medications to help with her pain. Pt has just been taking tramadol and tylenol recently. Pt noticed her pain worsened to a point where any position became uncomfortable. Pt reports a burning and tingling sensation that goes down her leg. Pt has not had PT for her back in the past but had it for her knee with some success. Heat pad and tens have been helpful but is only temporary relief. Pt reports no other aches or pains outside of her back.   PAIN:  Are you having pain? Yes: NPRS scale: 8/10 Pain location: Low back, right side  Pain description: Sharp Aggravating factors: prolonged positioning  Relieving factors: heat, tens ( temporary)  PRECAUTIONS:  None  RED FLAGS: None   WEIGHT BEARING RESTRICTIONS: No  FALLS:  Has patient fallen in last 6 months? 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 Bending and twisting are a part of her job. 7-4 M-F with some 1/2 days on Fridays.   PLOF: Independent  PATIENT GOALS: To improve her back pain.   NEXT MD VISIT: 04/22/23  OBJECTIVE:  Note: Objective measures were completed at Evaluation unless otherwise noted.  DIAGNOSTIC FINDINGS:  Impression for DGI lumbar from 7/26:  IMPRESSION: 1. Grade 1 anterolisthesis of L5 on S1, likely degenerative. 2. L4-L5 and L5-S1 facet hypertrophy. 3. Multilevel spondylosis with spurring, mild disc space narrowing in the upper lumbar spine.    PATIENT SURVEYS:  Modified Oswestry 40%  FOTO 51  COGNITION: Overall cognitive status: Within functional limits for tasks assessed     SENSATION: WFL   PALPATION: R lateral lumbar paraspinals, unable to palpate gluteal sensitivity or piriformis sensitivity secondary to positioning challenges, pt unable to lye in supine without moaning in pain in clinic.   LUMBAR ROM:   AROM eval  Flexion Hands to mid thigh  Extension No active extension without pain  Right lateral flexion Hands to mid thigh  Left lateral flexion Hands to mid thigh  Right rotation Limited due to pain  Left rotation Limited due to pain   (Blank rows = not tested)    (Blank rows = not tested)  LOWER EXTREMITY MMT:    MMT Right eval Left eval  Hip flexion 4 4  Hip extension    Hip abduction 4 4  Hip adduction 4 4  Hip internal rotation    Hip external rotation    Knee flexion 4+ 4+  Knee extension 4+ 4+  Ankle dorsiflexion    Ankle plantarflexion     Ankle inversion    Ankle eversion     (Blank rows = not tested)  LUMBAR SPECIAL TESTS:  Slump test: Negative  FUNCTIONAL TESTS:  5 times sit  to stand: 31.49 sec   GAIT: Distance walked: 10 M Assistive device utilized: None Level of assistance: Complete Independence Comments: slight hip drop bilaterally  TODAY'S TREATMENT:                                                                                                                               TE Heat donned to low back (pt reports nothing currently applied to skin that would prohibit safe use of heat/nothing applied to skin). Pt reports heat feels good, no adverse reaction to heat.  Stability ball rollouts cuing to complete in decreased ROM FWD/BCKWD x 60 sec Stability ball rollouts LTL x 60 sec performed through decreased range Seated adductor squeezes with pball 10x (feels discomfort in R glute, reports 1+ increase in pain) Seated march 2x10 each LE - rates easy LAQ 10x each LE - pt reports onset of R side pain after 6th repetition  Seated YTB hip abduction 3x10 bilat LE  Transferred seated>hooklye with attempt to log roll onto mat table- this is still very painful for pt. Pt attempts hooklye vs supine when on table, hooklye more comfortable but still painful.   Instruction in glute sets, then TrA activation with glute sets. Pt able to tolerate these fair, does not experience pain increase for several reps.  Hooklye>seated does increase pain but not as significantly as first lying down. Pt reports at this time to PT this is what she experiences every night when trying to go to bed, and reports interrupted sleep due to back pain with lying flat or on her side. She reports she is able to achieve comfortable, non-interrupted sleep in her recliner. PT suggests trial of 2-3 day sleeping in recliener for improved sleep to see impact on pain levels, then will attempt to progress with PT back to lying flat.     PATIENT  EDUCATION:  Education details: exercise technique, HEP, sleep positioning  Person educated: Patient Education method: Explanation, demo, VC, printout Education comprehension: verbalized understanding   HOME EXERCISE PROGRAM: Issued HEP: Instruction to perform as long as pain-limited or pain-free Access Code: [email or web address] URL: [email or web address] Date: 03/28/2023 Prepared by: Temple Pacini  Exercises - Seated March  - 1 x daily - 5 x weekly - 2 sets - 10 reps - Seated Long Arc Quad  - 1 x daily - 5 x weekly - 2 sets - 5-10 reps - Seated Hip Abduction with Resistance  - 1 x daily - 5 x weekly - 2 sets - 10 reps   ASSESSMENT:  CLINICAL IMPRESSION: Pt attempts log roll for more comfortable transfer to mat table today. She is still significantly limited by pain with lying supine or hooklye. Reports to PT at this time she is unable to achieve any comfortable horizontal position, whether supine, hooklye, or side-lying and that this significantly disrupts her sleep every night. PT intsructed for now to trial a few nights sleeping in her recliner, as pt reports she  is able to achieve comfortable, uninterrupted sleep this way. HEP also initiated this visit. The pt will benefit from further skilled PT to improve pain, strength and mobility in order to increase QOL.  OBJECTIVE IMPAIRMENTS: Abnormal gait, decreased activity tolerance, decreased mobility, decreased ROM, decreased strength, impaired perceived functional ability, increased muscle spasms, impaired flexibility, postural dysfunction, and pain.   ACTIVITY LIMITATIONS: carrying, lifting, bending, sitting, standing, squatting, sleeping, and locomotion level  PARTICIPATION LIMITATIONS: cleaning, laundry, driving, shopping, community activity, occupation, and yard work  PERSONAL FACTORS: Fitness, Profession, Time since onset of injury/illness/exacerbation, and 1-2 comorbidities: HTN, HLD  are also affecting patient's functional  outcome.   REHAB POTENTIAL: Fair time since onset, fitness level  CLINICAL DECISION MAKING: Stable/uncomplicated  EVALUATION COMPLEXITY: Low   GOALS: Goals reviewed with patient? Yes  SHORT TERM GOALS: Target date: 04/16/2023       Patient will be independent in home exercise program to improve strength/mobility for better functional independence with ADLs. Baseline: No HEP currently  Goal status: INITIAL   LONG TERM GOALS: Target date: 05/07/2023     Pt will improve FOTO score to 58 or greater in order to indicate clinically significant improvement in overall function as a result of her back limitations  Baseline: 51 Goal status: INITIAL  2.  Pt will improve MODI  score to 10/50 or less in order to indicate clinically significant improvement in overall function as a result of her back limitations  Baseline: 20/50 Goal status: INITIAL  3.  Pt will improve 5X STS to 18 sec or less in order to indicate improved function with transfers and improved LE strength and power.  Baseline: 32.49 sec  Goal status: INITIAL  4.  Patient will perform lumbar flexion, sidebending, and rotation, to within the tolerance of her spinal mobility with no reports of increased pain to allow for improved functional mobility Baseline: Significant pain with all of the above movements with significant limitation secondary to pain Goal status: INITIAL   PLAN:  PT FREQUENCY: 1-2x/week  PT DURATION: 6 weeks  PLANNED INTERVENTIONS: Therapeutic exercises, Therapeutic activity, Neuromuscular re-education, Balance training, Gait training, Patient/Family education, Self Care, Joint mobilization, Joint manipulation, Dry Needling, Spinal manipulation, Spinal mobilization, Moist heat, and Manual therapy.  PLAN FOR NEXT SESSION: Seated therex and mobility (hooklye, supine, and side-lye current significantly limited by pain)   Baird Kay, PT 03/29/2023, 1:26 PM

## 2023-03-29 ENCOUNTER — Ambulatory Visit: Payer: 59 | Admitting: Physical Therapy

## 2023-04-02 ENCOUNTER — Ambulatory Visit: Payer: 59

## 2023-04-02 ENCOUNTER — Encounter: Payer: Self-pay | Admitting: Physical Therapy

## 2023-04-02 DIAGNOSIS — R2681 Unsteadiness on feet: Secondary | ICD-10-CM | POA: Diagnosis not present

## 2023-04-02 DIAGNOSIS — R269 Unspecified abnormalities of gait and mobility: Secondary | ICD-10-CM | POA: Diagnosis not present

## 2023-04-02 DIAGNOSIS — M5416 Radiculopathy, lumbar region: Secondary | ICD-10-CM | POA: Diagnosis not present

## 2023-04-02 DIAGNOSIS — M6281 Muscle weakness (generalized): Secondary | ICD-10-CM

## 2023-04-02 DIAGNOSIS — R262 Difficulty in walking, not elsewhere classified: Secondary | ICD-10-CM

## 2023-04-02 DIAGNOSIS — M5459 Other low back pain: Secondary | ICD-10-CM | POA: Diagnosis not present

## 2023-04-02 DIAGNOSIS — R2689 Other abnormalities of gait and mobility: Secondary | ICD-10-CM | POA: Diagnosis not present

## 2023-04-02 NOTE — Therapy (Signed)
OUTPATIENT PHYSICAL THERAPY THORACOLUMBAR TREATMENT   Patient Name: Melanie Fisher MRN: [id number] DOB:[birth year], 57 y.o., female Today's Date: 04/02/2023  END OF SESSION:  PT End of Session - 04/02/23 1613     Visit Number 3    Number of Visits 12    Date for PT Re-Evaluation 05/07/23    PT Start Time 1614    PT Stop Time 1656    PT Time Calculation (min) 42 min    Activity Tolerance Patient limited by pain              Past Medical History:  Diagnosis Date   Allergy    Anxiety    Asthma    Depression    GERD (gastroesophageal reflux disease)    Heart murmur    Hyperlipidemia    Hypertension    Sleep apnea    Tobacco abuse    Past Surgical History:  Procedure Laterality Date   ABDOMINAL HYSTERECTOMY  2008   partial   COLONOSCOPY WITH PROPOFOL N/A 06/04/2018   Procedure: COLONOSCOPY WITH PROPOFOL;  Surgeon: Wyline Mood, MD;  Location: [facility] ENDOSCOPY;  Service: Gastroenterology;  Laterality: N/A;   right toe bunionectomy     right wrist ganglion cyst removal     Patient Active Problem List   Diagnosis Date Noted   Acute renal failure (ARF) (HCC) 10/17/2022   Chronic bilateral low back pain with right-sided sciatica 10/05/2022   OSA (obstructive sleep apnea) 04/06/2021   Prediabetes 12/25/2020   Herpes simplex infection 05/28/2019   Widowed 05/21/2018   Tubular adenoma of colon 05/21/2018   B12 deficiency 05/21/2018   Insomnia due to anxiety and fear 04/10/2015   Polyneuropathy 02/10/2015   Essential hypertension 08/19/2014   Degenerative disc disease, thoracic 03/23/2014   Nonallopathic lesion of thoracic region 03/23/2014   Nonallopathic lesion-rib cage 03/23/2014   History of colonic polyps 02/11/2014   Obesity 07/01/2013   History of tobacco abuse 03/15/2013   Encounter for preventive health examination 09/04/2012   COPD (chronic obstructive pulmonary disease) (HCC) 05/26/2012   Chronic shoulder pain 04/09/2012   Generalized anxiety disorder  04/06/2012   Depression with anxiety    Heart murmur    Hyperlipidemia    Allergic rhinitis due to grass pollen     PCP:    Sherlene Shams, MD    REFERRING PROVIDER:    Erick Colace, MD    REFERRING DIAG: M54.16 (ICD-10-CM) - Right lumbar radiculitis   Rationale for Evaluation and Treatment: Rehabilitation  THERAPY DIAG:  Muscle weakness (generalized)  Other abnormalities of gait and mobility  Other low back pain  Abnormality of gait and mobility  Difficulty in walking, not elsewhere classified  Unsteadiness on feet  ONSET DATE: 03/19/2013   SUBJECTIVE:  SUBJECTIVE STATEMENT:  Pt reports she is feeling tired and rates pain as 8/10. Patient reports that her back hurt for a day after last session but not unsual.    PERTINENT HISTORY:  HLD, HTN, long history of low back pain.   Pt reports she has had progressing back pain for about 10 years. She reports she has DDD and has been on and off several medications to help with her pain. Pt has just been taking tramadol and tylenol recently. Pt noticed her pain worsened to a point where any position became uncomfortable. Pt reports a burning and tingling sensation that goes down her leg. Pt has not had PT for her back in the past but had it for her knee with some success. Heat pad and tens have been helpful but is only temporary relief. Pt reports no other aches or pains outside of her back.   PAIN:  Are you having pain? Yes: NPRS scale: 8/10 Pain location: Low back, right side  Pain description: Sharp Aggravating factors: prolonged positioning  Relieving factors: heat, tens ( temporary)  PRECAUTIONS: None  RED FLAGS: None   WEIGHT BEARING RESTRICTIONS: No  FALLS:  Has patient fallen in last 6 months? 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 Bending and twisting are a part of her job. 7-4 M-F with some 1/2 days on Fridays.   PLOF: Independent  PATIENT GOALS: To improve her back pain.   NEXT MD VISIT: 04/22/23  OBJECTIVE:  Note: Objective measures were completed at Evaluation unless otherwise noted.  DIAGNOSTIC FINDINGS:  Impression for DGI lumbar from 7/26:  IMPRESSION: 1. Grade 1 anterolisthesis of L5 on S1, likely degenerative. 2. L4-L5 and L5-S1 facet hypertrophy. 3. Multilevel spondylosis with spurring, mild disc space narrowing in the upper lumbar spine.    PATIENT SURVEYS:  Modified Oswestry 40%  FOTO 51  COGNITION: Overall cognitive status: Within functional limits for tasks assessed     SENSATION: WFL   PALPATION: R lateral lumbar paraspinals, unable to palpate gluteal sensitivity or piriformis sensitivity secondary to positioning challenges, pt unable to lye in supine without moaning in pain in clinic.   LUMBAR ROM:   AROM eval  Flexion Hands to mid thigh  Extension No active extension without pain  Right lateral flexion Hands to mid thigh  Left lateral flexion Hands to mid thigh  Right rotation Limited due to pain  Left rotation Limited due to pain   (Blank rows = not tested)    (Blank rows = not tested)  LOWER EXTREMITY MMT:    MMT Right eval Left eval  Hip flexion 4 4  Hip extension    Hip abduction 4 4  Hip adduction 4 4  Hip internal rotation    Hip external rotation    Knee flexion 4+ 4+  Knee extension 4+ 4+  Ankle dorsiflexion    Ankle plantarflexion    Ankle inversion    Ankle eversion     (Blank rows = not tested)  LUMBAR SPECIAL TESTS:  Slump test:  Negative  FUNCTIONAL TESTS:  5 times sit to stand: 31.49 sec   GAIT: Distance walked: 10 M Assistive device utilized: None Level of assistance: Complete Independence Comments: slight hip  drop bilaterally  TODAY'S TREATMENT: 04/02/23                                                                                                                             TE Heat donned to low back (pt reports nothing currently applied to skin that would prohibit safe use of heat/nothing applied to skin). Pt reports heat feels good, no adverse reaction to heat.  Nustep level 2 x 6 minutes for B LE/UE strengthening and endurance   Stability ball rollouts cuing to complete in decreased ROM FWD/BCKWD x 60 sec Stability ball rollouts LTL x 60 sec performed through decreased range Seated march 2x10 each LE - 2# AW LAQ 2 x 10 each LE - 2# AW  Seated piriformis stretch x 30 seconds each   Seated YTB hip abduction 2 x10 bilat LE Paloff press seated with GTB x 10 each direction - light resistance from PT - patient reports feeling a spasm on the R flank   PATIENT EDUCATION:  Education details: exercise technique, HEP, sleep positioning  Person educated: Patient Education method: Explanation, demo, VC, printout Education comprehension: verbalized understanding   HOME EXERCISE PROGRAM: Issued HEP: Instruction to perform as long as pain-limited or pain-free Access Code: [email or web address] URL: [email or web address] Date: 03/28/2023 Prepared by: Temple Pacini  Exercises - Seated March  - 1 x daily - 5 x weekly - 2 sets - 10 reps - Seated Long Arc Quad  - 1 x daily - 5 x weekly - 2 sets - 5-10 reps - Seated Hip Abduction with Resistance  - 1 x daily - 5 x weekly - 2 sets - 10 reps   ASSESSMENT:  CLINICAL IMPRESSION:  Patient arrives treatment session motivated to participate with complaints of typical back pain that she experiences. Session focused on LE strengthening and lower back stretching.  Tolerated treatment session well with no increase in pain. The pt will benefit from further skilled PT to improve pain, strength and mobility in order to increase QOL.  OBJECTIVE IMPAIRMENTS: Abnormal gait, decreased activity tolerance, decreased mobility, decreased ROM, decreased strength, impaired perceived functional ability, increased muscle spasms, impaired flexibility, postural dysfunction, and pain.   ACTIVITY LIMITATIONS: carrying, lifting, bending, sitting, standing, squatting, sleeping, and locomotion level  PARTICIPATION LIMITATIONS: cleaning, laundry, driving, shopping, community activity, occupation, and yard work  PERSONAL FACTORS: Fitness, Profession, Time since onset of injury/illness/exacerbation, and 1-2 comorbidities: HTN, HLD  are also affecting patient's functional outcome.   REHAB POTENTIAL: Fair time since onset, fitness level  CLINICAL DECISION MAKING: Stable/uncomplicated  EVALUATION COMPLEXITY: Low   GOALS: Goals reviewed with patient? Yes  SHORT TERM GOALS: Target date: 04/16/2023       Patient will be independent in home exercise program to improve strength/mobility for better functional independence with ADLs. Baseline: No HEP currently  Goal status: INITIAL   LONG TERM GOALS: Target date: 05/07/2023     Pt will improve FOTO score to 58 or greater in order  to indicate clinically significant improvement in overall function as a result of her back limitations  Baseline: 51 Goal status: INITIAL  2.  Pt will improve MODI  score to 10/50 or less in order to indicate clinically significant improvement in overall function as a result of her back limitations  Baseline: 20/50 Goal status: INITIAL  3.  Pt will improve 5X STS to 18 sec or less in order to indicate improved function with transfers and improved LE strength and power.  Baseline: 32.49 sec  Goal status: INITIAL  4.  Patient will perform lumbar flexion, sidebending, and rotation, to within  the tolerance of her spinal mobility with no reports of increased pain to allow for improved functional mobility Baseline: Significant pain with all of the above movements with significant limitation secondary to pain Goal status: INITIAL   PLAN:  PT FREQUENCY: 1-2x/week  PT DURATION: 6 weeks  PLANNED INTERVENTIONS: Therapeutic exercises, Therapeutic activity, Neuromuscular re-education, Balance training, Gait training, Patient/Family education, Self Care, Joint mobilization, Joint manipulation, Dry Needling, Spinal manipulation, Spinal mobilization, Moist heat, and Manual therapy.  PLAN FOR NEXT SESSION: Seated therex and mobility (hooklye, supine, and side-lye current significantly limited by pain)   Viviann Spare, PT, DPT 04/02/2023, 4:14 PM

## 2023-04-04 ENCOUNTER — Ambulatory Visit: Payer: 59

## 2023-04-04 DIAGNOSIS — R269 Unspecified abnormalities of gait and mobility: Secondary | ICD-10-CM | POA: Diagnosis not present

## 2023-04-04 DIAGNOSIS — M6281 Muscle weakness (generalized): Secondary | ICD-10-CM | POA: Diagnosis not present

## 2023-04-04 DIAGNOSIS — R2689 Other abnormalities of gait and mobility: Secondary | ICD-10-CM | POA: Diagnosis not present

## 2023-04-04 DIAGNOSIS — M5416 Radiculopathy, lumbar region: Secondary | ICD-10-CM | POA: Diagnosis not present

## 2023-04-04 DIAGNOSIS — M5459 Other low back pain: Secondary | ICD-10-CM

## 2023-04-04 DIAGNOSIS — R2681 Unsteadiness on feet: Secondary | ICD-10-CM | POA: Diagnosis not present

## 2023-04-04 DIAGNOSIS — R262 Difficulty in walking, not elsewhere classified: Secondary | ICD-10-CM | POA: Diagnosis not present

## 2023-04-04 NOTE — Therapy (Signed)
OUTPATIENT PHYSICAL THERAPY THORACOLUMBAR TREATMENT   Patient Name: Melanie Fisher MRN: [id number] DOB:[birth year], 57 y.o., female Today's Date: 04/04/2023  END OF SESSION:  PT End of Session - 04/04/23 1703     Visit Number 4    Number of Visits 12    Date for PT Re-Evaluation 05/07/23    PT Start Time 1622    PT Stop Time 1658    PT Time Calculation (min) 36 min    Activity Tolerance Patient limited by pain;Patient tolerated treatment well               Past Medical History:  Diagnosis Date   Allergy    Anxiety    Asthma    Depression    GERD (gastroesophageal reflux disease)    Heart murmur    Hyperlipidemia    Hypertension    Sleep apnea    Tobacco abuse    Past Surgical History:  Procedure Laterality Date   ABDOMINAL HYSTERECTOMY  2008   partial   COLONOSCOPY WITH PROPOFOL N/A 06/04/2018   Procedure: COLONOSCOPY WITH PROPOFOL;  Surgeon: Wyline Mood, MD;  Location: [facility] ENDOSCOPY;  Service: Gastroenterology;  Laterality: N/A;   right toe bunionectomy     right wrist ganglion cyst removal     Patient Active Problem List   Diagnosis Date Noted   Acute renal failure (ARF) (HCC) 10/17/2022   Chronic bilateral low back pain with right-sided sciatica 10/05/2022   OSA (obstructive sleep apnea) 04/06/2021   Prediabetes 12/25/2020   Herpes simplex infection 05/28/2019   Widowed 05/21/2018   Tubular adenoma of colon 05/21/2018   B12 deficiency 05/21/2018   Insomnia due to anxiety and fear 04/10/2015   Polyneuropathy 02/10/2015   Essential hypertension 08/19/2014   Degenerative disc disease, thoracic 03/23/2014   Nonallopathic lesion of thoracic region 03/23/2014   Nonallopathic lesion-rib cage 03/23/2014   History of colonic polyps 02/11/2014   Obesity 07/01/2013   History of tobacco abuse 03/15/2013   Encounter for preventive health examination 09/04/2012   COPD (chronic obstructive pulmonary disease) (HCC) 05/26/2012   Chronic shoulder pain  04/09/2012   Generalized anxiety disorder 04/06/2012   Depression with anxiety    Heart murmur    Hyperlipidemia    Allergic rhinitis due to grass pollen     PCP:    Sherlene Shams, MD    REFERRING PROVIDER:    Erick Colace, MD    REFERRING DIAG: M54.16 (ICD-10-CM) - Right lumbar radiculitis   Rationale for Evaluation and Treatment: Rehabilitation  THERAPY DIAG:  Other low back pain  Muscle weakness (generalized)  Difficulty in walking, not elsewhere classified  ONSET DATE: 03/19/2013   SUBJECTIVE:  SUBJECTIVE STATEMENT:  Pt rates 7/10 pain currently, she didn't have to do a lot of bending today. Pt reports tried sleeping in recliner but still woke up due to back pain. Pt reports laying on L side is most comfortable, although this is also not comfortable.    PERTINENT HISTORY:  HLD, HTN, long history of low back pain.   Pt reports she has had progressing back pain for about 10 years. She reports she has DDD and has been on and off several medications to help with her pain. Pt has just been taking tramadol and tylenol recently. Pt noticed her pain worsened to a point where any position became uncomfortable. Pt reports a burning and tingling sensation that goes down her leg. Pt has not had PT for her back in the past but had it for her knee with some success. Heat pad and tens have been helpful but is only temporary relief. Pt reports no other aches or pains outside of her back.   PAIN:  Are you having pain? Yes: NPRS scale: 8/10 Pain location: Low back, right side  Pain description: Sharp Aggravating factors: prolonged positioning  Relieving factors: heat, tens ( temporary)  PRECAUTIONS: None  RED FLAGS: None   WEIGHT BEARING RESTRICTIONS: No  FALLS:  Has patient fallen  in last 6 months? 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 Bending and twisting are a part of her job. 7-4 M-F with some 1/2 days on Fridays.   PLOF: Independent  PATIENT GOALS: To improve her back pain.   NEXT MD VISIT: 04/22/23  OBJECTIVE:  Note: Objective measures were completed at Evaluation unless otherwise noted.  DIAGNOSTIC FINDINGS:  Impression for DGI lumbar from 7/26:  IMPRESSION: 1. Grade 1 anterolisthesis of L5 on S1, likely degenerative. 2. L4-L5 and L5-S1 facet hypertrophy. 3. Multilevel spondylosis with spurring, mild disc space narrowing in the upper lumbar spine.    PATIENT SURVEYS:  Modified Oswestry 40%  FOTO 51  COGNITION: Overall cognitive status: Within functional limits for tasks assessed     SENSATION: WFL   PALPATION: R lateral lumbar paraspinals, unable to palpate gluteal sensitivity or piriformis sensitivity secondary to positioning challenges, pt unable to lye in supine without moaning in pain in clinic.   LUMBAR ROM:   AROM eval  Flexion Hands to mid thigh  Extension No active extension without pain  Right lateral flexion Hands to mid thigh  Left lateral flexion Hands to mid thigh  Right rotation Limited due to pain  Left rotation Limited due to pain   (Blank rows = not tested)    (Blank rows = not tested)  LOWER EXTREMITY MMT:    MMT Right eval Left eval  Hip flexion 4 4  Hip extension    Hip abduction 4 4  Hip adduction 4 4  Hip internal rotation    Hip external rotation    Knee flexion 4+ 4+  Knee extension 4+ 4+  Ankle dorsiflexion    Ankle plantarflexion    Ankle inversion    Ankle eversion     (Blank rows = not tested)  LUMBAR SPECIAL  TESTS:  Slump test: Negative  FUNCTIONAL TESTS:  5 times sit to stand: 31.49 sec  GAIT: Distance walked: 10 M Assistive device utilized: None Level of assistance: Complete Independence Comments: slight hip drop bilaterally  TODAY'S TREATMENT: 04/04/23                                                                                                                             TE Heat donned to low back (pt reports nothing currently applied to skin that would prohibit safe use of heat). Pt reports heat feels good, no adverse reaction to heat.  Stability ball rollouts cuing to complete in decreased ROM FWD/BCKWD x 60 sec Stability ball rollouts LTL x 60 sec performed through decreased range  Seated glute set 10x with 3 sec hold/rep - cuing to decrease intensity of contraction to increase comfort, feels a tightness   Seated hamstring stretch with ankle in PF 2x30 sec each LE. Attempts with DF but brings on symptoms suggestive of neural tension   R positive slump test   Seated march 1x10 each LE --repeats with 2# ankle weights 2x10  LAQ 2 x 10 each LE - 2# AW   Nustep level 1 x 6 minutes for B LE/UE strengthening and endurance. Attempts lvl 2 but too pain-limited today, PT adjusts intensity and monitors pt for response. SPM 40s.  Seated YTB hip abduction 3 x12  bilat LE. Rates medium  Gait for distance with 2# AW able to complete approx 90 ft before needing aw removed due to radicular symptoms in RLE. Able to complete remaining 58 ft comfortably without AW.     PATIENT EDUCATION:  Education details: exercise technique Person educated: Patient Education method: Explanation, demo, VC,  Education comprehension: verbalized understanding   HOME EXERCISE PROGRAM: Issued HEP: Instruction to perform as long as pain-limited or pain-free Access Code: [email or web address] URL: [email or web address] Date: 03/28/2023 Prepared by: Temple Pacini  Exercises - Seated March  - 1 x daily  - 5 x weekly - 2 sets - 10 reps - Seated Long Arc Quad  - 1 x daily - 5 x weekly - 2 sets - 5-10 reps - Seated Hip Abduction with Resistance  - 1 x daily - 5 x weekly - 2 sets - 10 reps   ASSESSMENT:  CLINICAL IMPRESSION:  Pt able to progress a few strengthening interventions by performing at increased volume without pain increase. Although pt shows progress, she is still very pain-limited requiring modifications for majority of interventions. The pt will benefit from further skilled PT to improve pain, strength and mobility in order to increase QOL.  OBJECTIVE IMPAIRMENTS: Abnormal gait, decreased activity tolerance, decreased mobility, decreased ROM, decreased strength, impaired perceived functional ability, increased muscle spasms, impaired flexibility, postural dysfunction, and pain.   ACTIVITY LIMITATIONS: carrying, lifting, bending, sitting, standing, squatting, sleeping, and locomotion level  PARTICIPATION LIMITATIONS: cleaning, laundry, driving, shopping, community activity, occupation, and yard work  PERSONAL FACTORS: Fitness, Profession, Time since onset of injury/illness/exacerbation, and 1-2 comorbidities: HTN, HLD  are also affecting patient's functional outcome.  REHAB POTENTIAL: Fair time since onset, fitness level  CLINICAL DECISION MAKING: Stable/uncomplicated  EVALUATION COMPLEXITY: Low   GOALS: Goals reviewed with patient? Yes  SHORT TERM GOALS: Target date: 04/16/2023       Patient will be independent in home exercise program to improve strength/mobility for better functional independence with ADLs. Baseline: No HEP currently  Goal status: INITIAL   LONG TERM GOALS: Target date: 05/07/2023     Pt will improve FOTO score to 58 or greater in order to indicate clinically significant improvement in overall function as a result of her back limitations  Baseline: 51 Goal status: INITIAL  2.  Pt will improve MODI  score to 10/50 or less in order to  indicate clinically significant improvement in overall function as a result of her back limitations  Baseline: 20/50 Goal status: INITIAL  3.  Pt will improve 5X STS to 18 sec or less in order to indicate improved function with transfers and improved LE strength and power.  Baseline: 32.49 sec  Goal status: INITIAL  4.  Patient will perform lumbar flexion, sidebending, and rotation, to within the tolerance of her spinal mobility with no reports of increased pain to allow for improved functional mobility Baseline: Significant pain with all of the above movements with significant limitation secondary to pain Goal status: INITIAL   PLAN:  PT FREQUENCY: 1-2x/week  PT DURATION: 6 weeks  PLANNED INTERVENTIONS: Therapeutic exercises, Therapeutic activity, Neuromuscular re-education, Balance training, Gait training, Patient/Family education, Self Care, Joint mobilization, Joint manipulation, Dry Needling, Spinal manipulation, Spinal mobilization, Moist heat, and Manual therapy.  PLAN FOR NEXT SESSION: Seated therex and mobility (hooklye, supine, and side-lye current significantly limited by pain)   Baird Kay, PT, DPT 04/04/2023, 5:04 PM

## 2023-04-10 ENCOUNTER — Ambulatory Visit: Payer: 59

## 2023-04-10 DIAGNOSIS — R2689 Other abnormalities of gait and mobility: Secondary | ICD-10-CM

## 2023-04-10 DIAGNOSIS — M6281 Muscle weakness (generalized): Secondary | ICD-10-CM | POA: Diagnosis not present

## 2023-04-10 DIAGNOSIS — M5416 Radiculopathy, lumbar region: Secondary | ICD-10-CM | POA: Diagnosis not present

## 2023-04-10 DIAGNOSIS — R262 Difficulty in walking, not elsewhere classified: Secondary | ICD-10-CM

## 2023-04-10 DIAGNOSIS — M5459 Other low back pain: Secondary | ICD-10-CM

## 2023-04-10 DIAGNOSIS — R269 Unspecified abnormalities of gait and mobility: Secondary | ICD-10-CM | POA: Diagnosis not present

## 2023-04-10 DIAGNOSIS — R2681 Unsteadiness on feet: Secondary | ICD-10-CM | POA: Diagnosis not present

## 2023-04-10 NOTE — Therapy (Signed)
OUTPATIENT PHYSICAL THERAPY THORACOLUMBAR TREATMENT   Patient Name: Melanie Fisher MRN: [id number] DOB:01-04-1966, 57 y.o., female Today's Date: 04/11/2023  END OF SESSION:  PT End of Session - 04/10/23 1618     Visit Number 5    Number of Visits 12    Date for PT Re-Evaluation 05/07/23    PT Start Time 1616    PT Stop Time 1659    PT Time Calculation (min) 43 min    Activity Tolerance Patient limited by pain;Patient tolerated treatment well                Past Medical History:  Diagnosis Date   Allergy    Anxiety    Asthma    Depression    GERD (gastroesophageal reflux disease)    Heart murmur    Hyperlipidemia    Hypertension    Sleep apnea    Tobacco abuse    Past Surgical History:  Procedure Laterality Date   ABDOMINAL HYSTERECTOMY  2008   partial   COLONOSCOPY WITH PROPOFOL N/A 06/04/2018   Procedure: COLONOSCOPY WITH PROPOFOL;  Surgeon: Wyline Mood, MD;  Location: [facility] ENDOSCOPY;  Service: Gastroenterology;  Laterality: N/A;   right toe bunionectomy     right wrist ganglion cyst removal     Patient Active Problem List   Diagnosis Date Noted   Acute renal failure (ARF) (HCC) 10/17/2022   Chronic bilateral low back pain with right-sided sciatica 10/05/2022   OSA (obstructive sleep apnea) 04/06/2021   Prediabetes 12/25/2020   Herpes simplex infection 05/28/2019   Widowed 05/21/2018   Tubular adenoma of colon 05/21/2018   B12 deficiency 05/21/2018   Insomnia due to anxiety and fear 04/10/2015   Polyneuropathy 02/10/2015   Essential hypertension 08/19/2014   Degenerative disc disease, thoracic 03/23/2014   Nonallopathic lesion of thoracic region 03/23/2014   Nonallopathic lesion-rib cage 03/23/2014   History of colonic polyps 02/11/2014   Obesity 07/01/2013   History of tobacco abuse 03/15/2013   Encounter for preventive health examination 09/04/2012   COPD (chronic obstructive pulmonary disease) (HCC) 05/26/2012   Chronic shoulder pain  04/09/2012   Generalized anxiety disorder 04/06/2012   Depression with anxiety    Heart murmur    Hyperlipidemia    Allergic rhinitis due to grass pollen     PCP:    Sherlene Shams, MD    REFERRING PROVIDER:    Erick Colace, MD    REFERRING DIAG: M54.16 (ICD-10-CM) - Right lumbar radiculitis   Rationale for Evaluation and Treatment: Rehabilitation  THERAPY DIAG:  Other low back pain  Muscle weakness (generalized)  Difficulty in walking, not elsewhere classified  Other abnormalities of gait and mobility  ONSET DATE: 03/19/2013   SUBJECTIVE:  SUBJECTIVE STATEMENT:  Patient reports her pain is a 9/10 and reports it starting to be in LLE as well.    PERTINENT HISTORY:  HLD, HTN, long history of low back pain.   Pt reports she has had progressing back pain for about 10 years. She reports she has DDD and has been on and off several medications to help with her pain. Pt has just been taking tramadol and tylenol recently. Pt noticed her pain worsened to a point where any position became uncomfortable. Pt reports a burning and tingling sensation that goes down her leg. Pt has not had PT for her back in the past but had it for her knee with some success. Heat pad and tens have been helpful but is only temporary relief. Pt reports no other aches or pains outside of her back.   PAIN:  Are you having pain? Yes: NPRS scale: 8/10 Pain location: Low back, right side  Pain description: Sharp Aggravating factors: prolonged positioning  Relieving factors: heat, tens ( temporary)  PRECAUTIONS: None  RED FLAGS: None   WEIGHT BEARING RESTRICTIONS: No  FALLS:  Has patient fallen in last 6 months? 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 Bending and twisting are a part of her job. 7-4 M-F with some 1/2 days on Fridays.   PLOF: Independent  PATIENT GOALS: To improve her back pain.   NEXT MD VISIT: 04/22/23  OBJECTIVE:  Note: Objective measures were completed at Evaluation unless otherwise noted.  DIAGNOSTIC FINDINGS:  Impression for DGI lumbar from 7/26:  IMPRESSION: 1. Grade 1 anterolisthesis of L5 on S1, likely degenerative. 2. L4-L5 and L5-S1 facet hypertrophy. 3. Multilevel spondylosis with spurring, mild disc space narrowing in the upper lumbar spine.    PATIENT SURVEYS:  Modified Oswestry 40%  FOTO 51  COGNITION: Overall cognitive status: Within functional limits for tasks assessed     SENSATION: WFL   PALPATION: R lateral lumbar paraspinals, unable to palpate gluteal sensitivity or piriformis sensitivity secondary to positioning challenges, pt unable to lye in supine without moaning in pain in clinic.   LUMBAR ROM:   AROM eval  Flexion Hands to mid thigh  Extension No active extension without pain  Right lateral flexion Hands to mid thigh  Left lateral flexion Hands to mid thigh  Right rotation Limited due to pain  Left rotation Limited due to pain   (Blank rows = not tested)    (Blank rows = not tested)  LOWER EXTREMITY MMT:    MMT Right eval Left eval  Hip flexion 4 4  Hip extension    Hip abduction 4 4  Hip adduction 4 4  Hip internal rotation    Hip external rotation    Knee flexion 4+ 4+  Knee extension 4+ 4+  Ankle dorsiflexion    Ankle plantarflexion    Ankle inversion    Ankle eversion     (Blank rows = not tested)  LUMBAR SPECIAL TESTS:  Slump test: Negative  FUNCTIONAL TESTS:  5 times sit to stand: 31.49 sec   GAIT: Distance  walked: 10 M Assistive device utilized: None Level of assistance: Complete Independence Comments: slight hip drop bilaterally  TODAY'S TREATMENT: 04/11/23  TE Heat donned to low back (pt reports nothing currently applied to skin that would prohibit safe use of heat). Pt reports heat feels good, no adverse reaction to heat.  Hamsring lengthening on PT leg 60 seconds Contract relax pressing leg into PT leg 10x  TrA contraction with swiss ball 10x  TrA contraction with UE raises 10x Prostretch 2 minutes RTB abduction 10x  RTB march-terminated due to pain Anterior/posterior stretch 30 seconds   Attempted use of pelvic belt: unsuccessful and painful     PATIENT EDUCATION:  Education details: exercise technique Person educated: Patient Education method: Programmer, multimedia, Manufacturing engineer, VC,  Education comprehension: verbalized understanding   HOME EXERCISE PROGRAM: Issued HEP: Instruction to perform as long as pain-limited or pain-free Access Code: [email or web address] URL: [email or web address] Date: 03/28/2023 Prepared by: Temple Pacini  Exercises - Seated March  - 1 x daily - 5 x weekly - 2 sets - 10 reps - Seated Long Arc Quad  - 1 x daily - 5 x weekly - 2 sets - 5-10 reps - Seated Hip Abduction with Resistance  - 1 x daily - 5 x weekly - 2 sets - 10 reps   ASSESSMENT:  CLINICAL IMPRESSION:  Patient has significant low back pain that is not resolved by end of session. Attempt at pelvic belt is unsuccessful.  She has pain with lifting of legs at this time. The pt will benefit from further skilled PT to improve pain, strength and mobility in order to increase QOL.  OBJECTIVE IMPAIRMENTS: Abnormal gait, decreased activity tolerance, decreased mobility, decreased ROM, decreased strength, impaired perceived functional ability, increased muscle spasms, impaired  flexibility, postural dysfunction, and pain.   ACTIVITY LIMITATIONS: carrying, lifting, bending, sitting, standing, squatting, sleeping, and locomotion level  PARTICIPATION LIMITATIONS: cleaning, laundry, driving, shopping, community activity, occupation, and yard work  PERSONAL FACTORS: Fitness, Profession, Time since onset of injury/illness/exacerbation, and 1-2 comorbidities: HTN, HLD  are also affecting patient's functional outcome.   REHAB POTENTIAL: Fair time since onset, fitness level  CLINICAL DECISION MAKING: Stable/uncomplicated  EVALUATION COMPLEXITY: Low   GOALS: Goals reviewed with patient? Yes  SHORT TERM GOALS: Target date: 04/16/2023       Patient will be independent in home exercise program to improve strength/mobility for better functional independence with ADLs. Baseline: No HEP currently  Goal status: INITIAL   LONG TERM GOALS: Target date: 05/07/2023     Pt will improve FOTO score to 58 or greater in order to indicate clinically significant improvement in overall function as a result of her back limitations  Baseline: 51 Goal status: INITIAL  2.  Pt will improve MODI  score to 10/50 or less in order to indicate clinically significant improvement in overall function as a result of her back limitations  Baseline: 20/50 Goal status: INITIAL  3.  Pt will improve 5X STS to 18 sec or less in order to indicate improved function with transfers and improved LE strength and power.  Baseline: 32.49 sec  Goal status: INITIAL  4.  Patient will perform lumbar flexion, sidebending, and rotation, to within the tolerance of her spinal mobility with no reports of increased pain to allow for improved functional mobility Baseline: Significant pain with all of the above movements with significant limitation secondary to pain Goal status: INITIAL   PLAN:  PT FREQUENCY: 1-2x/week  PT DURATION: 6 weeks  PLANNED INTERVENTIONS: Therapeutic exercises, Therapeutic  activity, Neuromuscular re-education, Balance training, Gait training, Patient/Family education, Self Care, Joint mobilization, Joint manipulation, Dry Needling, Spinal manipulation, Spinal  mobilization, Moist heat, and Manual therapy.  PLAN FOR NEXT SESSION: Seated therex and mobility (hooklye, supine, and side-lye current significantly limited by pain)   Precious Bard, PT, DPT 04/11/2023, 7:38 AM

## 2023-04-15 ENCOUNTER — Emergency Department
Admission: EM | Admit: 2023-04-15 | Discharge: 2023-04-15 | Disposition: A | Discharge status: Home / Self Care | Payer: 59 | Attending: Emergency Medicine | Admitting: Emergency Medicine

## 2023-04-15 ENCOUNTER — Encounter: Payer: Self-pay | Admitting: Emergency Medicine

## 2023-04-15 ENCOUNTER — Other Ambulatory Visit: Payer: Self-pay

## 2023-04-15 DIAGNOSIS — S60861A Insect bite (nonvenomous) of right wrist, initial encounter: Secondary | ICD-10-CM | POA: Diagnosis not present

## 2023-04-15 DIAGNOSIS — W57XXXA Bitten or stung by nonvenomous insect and other nonvenomous arthropods, initial encounter: Secondary | ICD-10-CM | POA: Insufficient documentation

## 2023-04-15 DIAGNOSIS — I10 Essential (primary) hypertension: Secondary | ICD-10-CM | POA: Diagnosis not present

## 2023-04-15 DIAGNOSIS — J45909 Unspecified asthma, uncomplicated: Secondary | ICD-10-CM | POA: Diagnosis not present

## 2023-04-15 DIAGNOSIS — S61551A Open bite of right wrist, initial encounter: Secondary | ICD-10-CM | POA: Diagnosis not present

## 2023-04-15 MED ORDER — TRIAMCINOLONE ACETONIDE 0.1 % EX CREA: 1.0000 | TOPICAL_CREAM | Freq: Two times a day (BID) | CUTANEOUS | 0 refills | Status: DC

## 2023-04-15 NOTE — ED Provider Notes (Signed)
[facility] Provider Note    Event Date/Time   First MD Initiated Contact with Patient 04/15/23 [phone]     (approximate)   History   Insect Bite   HPI  Melanie Fisher is a 57 y.o. female with PMH of anxiety, hypertension, asthma, depression presents for evaluation of an insect bite.  Patient saw a spider on her yesterday and believes it bit her.  She reports swelling to her right wrist with the blister from the bite.  She says it is more itchy than painful.  She has applied benadryl and cortisone cream to the area.       Physical Exam   Triage Vital Signs: ED Triage Vitals  Encounter Vitals Group     BP 04/15/23 0925 (!) 148/78     Systolic BP Percentile --      Diastolic BP Percentile --      Pulse Rate 04/15/23 0925 64     Resp 04/15/23 0925 17     Temp 04/15/23 0925 98.3 F (36.8 C)     Temp Source 04/15/23 0925 Oral     SpO2 04/15/23 0925 100 %     Weight 04/15/23 0926 196 lb (88.9 kg)     Height 04/15/23 0926 5\' 3"  (1.6 m)     Head Circumference --      Peak Flow --      Pain Score 04/15/23 0925 5     Pain Loc --      Pain Education --      Exclude from Growth Chart --     Most recent vital signs: Vitals:   04/15/23 0925  BP: (!) 148/78  Pulse: 64  Resp: 17  Temp: 98.3 F (36.8 C)  SpO2: 100%   General: Awake, no distress.  CV:  Good peripheral perfusion.  Resp:  Normal effort.  Abd:  No distention.  Other:  Localized swelling to the right dorsal wrist, without overlying skin changes, erythema, induration or fluctuance.   ED Results / Procedures / Treatments   Labs (all labs ordered are listed, but only abnormal results are displayed) Labs Reviewed - No data to display    PROCEDURES:  Critical Care performed: No  Procedures   MEDICATIONS ORDERED IN ED: Medications - No data to display   IMPRESSION / MDM / ASSESSMENT AND PLAN / ED COURSE  I reviewed the triage vital signs and the nursing notes.                              57 year old female presents for evaluation of an insect bite. Patient was hypertensive in triage but has a history of hypertension, VSS otherwise. NAD on exam.  Differential diagnosis includes, but is not limited to, local reaction, cellulitis, abscess, anaphylaxis.  Patient's presentation is most consistent with acute, uncomplicated illness.  Patient appears to be having a localized reaction to the spider bite.  There is no evidence of cellulitis or abscess development.  My suspicion for anaphylaxis is very low as patient does not have urticaria, lip or tongue swelling, no difficulty breathing and no GI symptoms.  She does have some improvement with hydrocortisone cream but I will send her a stronger topical steroid.  I advised her to take a daily allergy medication to help with the itching symptoms.  She voiced understanding, all questions were answered and she was stable at discharge.     FINAL  CLINICAL IMPRESSION(S) / ED DIAGNOSES   Final diagnoses:  Insect bite of right wrist, initial encounter     Rx / DC Orders   ED Discharge Orders          Ordered    triamcinolone cream (KENALOG) 0.1 %  2 times daily        04/15/23 1134             Note:  This document was prepared using Dragon voice recognition software and may include unintentional dictation errors.   Cameron Ali, PA-C 04/15/23 1139    Minna Antis, MD 04/15/23 2259

## 2023-04-15 NOTE — ED Triage Notes (Signed)
Pt states this past Friday at 1430 she saw a spider on her and believe it bit her. Pt states swelling to the right wrist with a blister from the bite. Pt reports 5/10 pain. Pt states the bite does itch.   Pt states using benadryl and cortisone cream, and it helps temporarily.

## 2023-04-15 NOTE — Discharge Instructions (Addendum)
Apply the kenalog cream to the site twice daily for itching. Take allergy medicine as needed.

## 2023-04-15 NOTE — ED Notes (Signed)
See triage note  Presents with redness and swelling to right hand   States she has been using cortisone cream and benadryl w/o relief

## 2023-04-16 ENCOUNTER — Ambulatory Visit: Payer: 59 | Admitting: Physical Therapy

## 2023-04-16 DIAGNOSIS — R2681 Unsteadiness on feet: Secondary | ICD-10-CM | POA: Diagnosis not present

## 2023-04-16 DIAGNOSIS — M5416 Radiculopathy, lumbar region: Secondary | ICD-10-CM | POA: Diagnosis not present

## 2023-04-16 DIAGNOSIS — R2689 Other abnormalities of gait and mobility: Secondary | ICD-10-CM | POA: Diagnosis not present

## 2023-04-16 DIAGNOSIS — M5459 Other low back pain: Secondary | ICD-10-CM | POA: Diagnosis not present

## 2023-04-16 DIAGNOSIS — R262 Difficulty in walking, not elsewhere classified: Secondary | ICD-10-CM | POA: Diagnosis not present

## 2023-04-16 DIAGNOSIS — M6281 Muscle weakness (generalized): Secondary | ICD-10-CM

## 2023-04-16 DIAGNOSIS — R269 Unspecified abnormalities of gait and mobility: Secondary | ICD-10-CM | POA: Diagnosis not present

## 2023-04-16 NOTE — Therapy (Unsigned)
OUTPATIENT PHYSICAL THERAPY THORACOLUMBAR TREATMENT   Patient Name: Melanie Fisher MRN: [id number] DOB:11/05/1965, 57 y.o., female Today's Date: 04/16/2023  END OF SESSION:  PT End of Session - 04/16/23 1626     Visit Number 6    Number of Visits 12    Date for PT Re-Evaluation 05/07/23    PT Start Time 1615    PT Stop Time 1655    PT Time Calculation (min) 40 min    Activity Tolerance Patient limited by pain;Patient tolerated treatment well                 Past Medical History:  Diagnosis Date   Allergy    Anxiety    Asthma    Depression    GERD (gastroesophageal reflux disease)    Heart murmur    Hyperlipidemia    Hypertension    Sleep apnea    Tobacco abuse    Past Surgical History:  Procedure Laterality Date   ABDOMINAL HYSTERECTOMY  2008   partial   COLONOSCOPY WITH PROPOFOL N/A 06/04/2018   Procedure: COLONOSCOPY WITH PROPOFOL;  Surgeon: Wyline Mood, MD;  Location: [facility] ENDOSCOPY;  Service: Gastroenterology;  Laterality: N/A;   right toe bunionectomy     right wrist ganglion cyst removal     Patient Active Problem List   Diagnosis Date Noted   Acute renal failure (ARF) (HCC) 10/17/2022   Chronic bilateral low back pain with right-sided sciatica 10/05/2022   OSA (obstructive sleep apnea) 04/06/2021   Prediabetes 12/25/2020   Herpes simplex infection 05/28/2019   Widowed 05/21/2018   Tubular adenoma of colon 05/21/2018   B12 deficiency 05/21/2018   Insomnia due to anxiety and fear 04/10/2015   Polyneuropathy 02/10/2015   Essential hypertension 08/19/2014   Degenerative disc disease, thoracic 03/23/2014   Nonallopathic lesion of thoracic region 03/23/2014   Nonallopathic lesion-rib cage 03/23/2014   History of colonic polyps 02/11/2014   Obesity 07/01/2013   History of tobacco abuse 03/15/2013   Encounter for preventive health examination 09/04/2012   COPD (chronic obstructive pulmonary disease) (HCC) 05/26/2012   Chronic shoulder pain  04/09/2012   Generalized anxiety disorder 04/06/2012   Depression with anxiety    Heart murmur    Hyperlipidemia    Allergic rhinitis due to grass pollen     PCP:    Sherlene Shams, MD    REFERRING PROVIDER:    Erick Colace, MD    REFERRING DIAG: M54.16 (ICD-10-CM) - Right lumbar radiculitis   Rationale for Evaluation and Treatment: Rehabilitation  THERAPY DIAG:  No diagnosis found.  ONSET DATE: 03/19/2013   SUBJECTIVE:  SUBJECTIVE STATEMENT:  Patient reports her pain is a 8/10 and reports it starting to be in LLE as well.    PERTINENT HISTORY:  HLD, HTN, long history of low back pain.   Pt reports she has had progressing back pain for about 10 years. She reports she has DDD and has been on and off several medications to help with her pain. Pt has just been taking tramadol and tylenol recently. Pt noticed her pain worsened to a point where any position became uncomfortable. Pt reports a burning and tingling sensation that goes down her leg. Pt has not had PT for her back in the past but had it for her knee with some success. Heat pad and tens have been helpful but is only temporary relief. Pt reports no other aches or pains outside of her back.   PAIN:  Are you having pain? Yes: NPRS scale: 8/10 Pain location: Low back, right side  Pain description: Sharp Aggravating factors: prolonged positioning  Relieving factors: heat, tens ( temporary)  PRECAUTIONS: None  RED FLAGS: None   WEIGHT BEARING RESTRICTIONS: No  FALLS:  Has patient fallen in last 6 months? 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 Bending and twisting are a part of her job. 7-4 M-F with some 1/2 days on Fridays.   PLOF: Independent  PATIENT GOALS: To improve her back pain.   NEXT MD VISIT: 04/22/23  OBJECTIVE:  Note: Objective measures were completed at Evaluation unless otherwise noted.  DIAGNOSTIC FINDINGS:  Impression for DGI lumbar from 7/26:  IMPRESSION: 1. Grade 1 anterolisthesis of L5 on S1, likely degenerative. 2. L4-L5 and L5-S1 facet hypertrophy. 3. Multilevel spondylosis with spurring, mild disc space narrowing in the upper lumbar spine.    PATIENT SURVEYS:  Modified Oswestry 40%  FOTO 51  COGNITION: Overall cognitive status: Within functional limits for tasks assessed     SENSATION: WFL   PALPATION: R lateral lumbar paraspinals, unable to palpate gluteal sensitivity or piriformis sensitivity secondary to positioning challenges, pt unable to lye in supine without moaning in pain in clinic.   LUMBAR ROM:   AROM eval  Flexion Hands to mid thigh  Extension No active extension without pain  Right lateral flexion Hands to mid thigh  Left lateral flexion Hands to mid thigh  Right rotation Limited due to pain  Left rotation Limited due to pain   (Blank rows = not tested)    (Blank rows = not tested)  LOWER EXTREMITY MMT:    MMT Right eval Left eval  Hip flexion 4 4  Hip extension    Hip abduction 4 4  Hip adduction 4 4  Hip internal rotation    Hip external rotation    Knee flexion 4+ 4+  Knee extension 4+ 4+  Ankle dorsiflexion    Ankle plantarflexion    Ankle inversion    Ankle eversion     (Blank rows = not tested)  LUMBAR SPECIAL TESTS:  Slump test: Negative  FUNCTIONAL TESTS:  5 times sit to stand: 31.49 sec   GAIT: Distance walked: 10 M Assistive device utilized: None Level of assistance: Complete Independence Comments: slight hip drop bilaterally  TODAY'S  TREATMENT: 04/16/23  TE Nustep level 1 x 6 min with hot pack donned  Heat donned to low back (pt reports nothing currently applied to skin that would prohibit safe use of heat). Pt reports heat feels good, no adverse reaction to heat.  Seated ball roll out anterior x 10 ant and bilateral with 5 sec holds.  - to R caused some pain on the lateral aspect of the R LE   LLE piriformis stretch and figure 4 stretch seated x 45 sec ea LE Hamstring lengthening on step 2 x 45 sec ea side  Standing at steps hip flexor stretch 2 x 45 sec ea  Seated gluteal press down with GTB 2 x 10 reps     PATIENT EDUCATION:  Education details: exercise technique Person educated: Patient Education method: Programmer, multimedia, Manufacturing engineer, VC,  Education comprehension: verbalized understanding   HOME EXERCISE PROGRAM: Issued HEP: Instruction to perform as long as pain-limited or pain-free Access Code: [email or web address] URL: [email or web address] Date: 03/28/2023 Prepared by: Temple Pacini  Exercises - Seated March  - 1 x daily - 5 x weekly - 2 sets - 10 reps - Seated Long Arc Quad  - 1 x daily - 5 x weekly - 2 sets - 5-10 reps - Seated Hip Abduction with Resistance  - 1 x daily - 5 x weekly - 2 sets - 10 reps   ASSESSMENT:  CLINICAL IMPRESSION:   Patient has significant low back pain that is not resolved by end of session. Discussed potential for discharge if PT continues to be ineffective but she is encouraged to discuss this with MD prior to official discharge. Pt is agreeable with this plan as she tends to be in more pain following PT than prior to PT. The pt will benefit from further skilled PT to improve pain, strength and mobility in order to increase QOL.  OBJECTIVE IMPAIRMENTS: Abnormal gait, decreased activity tolerance, decreased mobility, decreased ROM, decreased strength, impaired  perceived functional ability, increased muscle spasms, impaired flexibility, postural dysfunction, and pain.   ACTIVITY LIMITATIONS: carrying, lifting, bending, sitting, standing, squatting, sleeping, and locomotion level  PARTICIPATION LIMITATIONS: cleaning, laundry, driving, shopping, community activity, occupation, and yard work  PERSONAL FACTORS: Fitness, Profession, Time since onset of injury/illness/exacerbation, and 1-2 comorbidities: HTN, HLD  are also affecting patient's functional outcome.   REHAB POTENTIAL: Fair time since onset, fitness level  CLINICAL DECISION MAKING: Stable/uncomplicated  EVALUATION COMPLEXITY: Low   GOALS: Goals reviewed with patient? Yes  SHORT TERM GOALS: Target date: 04/16/2023       Patient will be independent in home exercise program to improve strength/mobility for better functional independence with ADLs. Baseline: No HEP currently  Goal status: INITIAL   LONG TERM GOALS: Target date: 05/07/2023     Pt will improve FOTO score to 58 or greater in order to indicate clinically significant improvement in overall function as a result of her back limitations  Baseline: 51 Goal status: INITIAL  2.  Pt will improve MODI  score to 10/50 or less in order to indicate clinically significant improvement in overall function as a result of her back limitations  Baseline: 20/50 Goal status: INITIAL  3.  Pt will improve 5X STS to 18 sec or less in order to indicate improved function with transfers and improved LE strength and power.  Baseline: 32.49 sec  Goal status: INITIAL  4.  Patient will perform lumbar flexion, sidebending, and rotation, to within the tolerance of her spinal mobility with no reports of increased pain to allow for  improved functional mobility Baseline: Significant pain with all of the above movements with significant limitation secondary to pain Goal status: INITIAL   PLAN:  PT FREQUENCY: 1-2x/week  PT DURATION: 6  weeks  PLANNED INTERVENTIONS: Therapeutic exercises, Therapeutic activity, Neuromuscular re-education, Balance training, Gait training, Patient/Family education, Self Care, Joint mobilization, Joint manipulation, Dry Needling, Spinal manipulation, Spinal mobilization, Moist heat, and Manual therapy.  PLAN FOR NEXT SESSION: Seated therex and mobility (hooklye, supine, and side-lye current significantly limited by pain) Discuss discharge if PT continues to be ineffective or worsens pain pending discussion between MD and pt. Norman Herrlich, PT, DPT 04/16/2023, 4:28 PM

## 2023-04-17 ENCOUNTER — Other Ambulatory Visit: Payer: Self-pay | Admitting: Internal Medicine

## 2023-04-19 ENCOUNTER — Ambulatory Visit: Payer: 59

## 2023-04-19 DIAGNOSIS — R269 Unspecified abnormalities of gait and mobility: Secondary | ICD-10-CM | POA: Diagnosis not present

## 2023-04-19 DIAGNOSIS — M5416 Radiculopathy, lumbar region: Secondary | ICD-10-CM | POA: Diagnosis not present

## 2023-04-19 DIAGNOSIS — M5459 Other low back pain: Secondary | ICD-10-CM | POA: Diagnosis not present

## 2023-04-19 DIAGNOSIS — R2689 Other abnormalities of gait and mobility: Secondary | ICD-10-CM | POA: Diagnosis not present

## 2023-04-19 DIAGNOSIS — R262 Difficulty in walking, not elsewhere classified: Secondary | ICD-10-CM | POA: Diagnosis not present

## 2023-04-19 DIAGNOSIS — M6281 Muscle weakness (generalized): Secondary | ICD-10-CM | POA: Diagnosis not present

## 2023-04-19 DIAGNOSIS — R2681 Unsteadiness on feet: Secondary | ICD-10-CM | POA: Diagnosis not present

## 2023-04-19 NOTE — Therapy (Signed)
OUTPATIENT PHYSICAL THERAPY THORACOLUMBAR TREATMENT   Patient Name: Melanie Fisher MRN: [id number] DOB:[birth year], 57 y.o., female Today's Date: 04/19/2023  END OF SESSION:  PT End of Session - 04/19/23 1623     Visit Number 7    Number of Visits 12    Date for PT Re-Evaluation 05/07/23    PT Start Time 1618    PT Stop Time 1700    PT Time Calculation (min) 42 min    Activity Tolerance Patient limited by pain;Patient tolerated treatment well                  Past Medical History:  Diagnosis Date   Allergy    Anxiety    Asthma    Depression    GERD (gastroesophageal reflux disease)    Heart murmur    Hyperlipidemia    Hypertension    Sleep apnea    Tobacco abuse    Past Surgical History:  Procedure Laterality Date   ABDOMINAL HYSTERECTOMY  2008   partial   COLONOSCOPY WITH PROPOFOL N/A 06/04/2018   Procedure: COLONOSCOPY WITH PROPOFOL;  Surgeon: Wyline Mood, MD;  Location: [facility] ENDOSCOPY;  Service: Gastroenterology;  Laterality: N/A;   right toe bunionectomy     right wrist ganglion cyst removal     Patient Active Problem List   Diagnosis Date Noted   Acute renal failure (ARF) (HCC) 10/17/2022   Chronic bilateral low back pain with right-sided sciatica 10/05/2022   OSA (obstructive sleep apnea) 04/06/2021   Prediabetes 12/25/2020   Herpes simplex infection 05/28/2019   Widowed 05/21/2018   Tubular adenoma of colon 05/21/2018   B12 deficiency 05/21/2018   Insomnia due to anxiety and fear 04/10/2015   Polyneuropathy 02/10/2015   Essential hypertension 08/19/2014   Degenerative disc disease, thoracic 03/23/2014   Nonallopathic lesion of thoracic region 03/23/2014   Nonallopathic lesion-rib cage 03/23/2014   History of colonic polyps 02/11/2014   Obesity 07/01/2013   History of tobacco abuse 03/15/2013   Encounter for preventive health examination 09/04/2012   COPD (chronic obstructive pulmonary disease) (HCC) 05/26/2012   Chronic shoulder pain  04/09/2012   Generalized anxiety disorder 04/06/2012   Depression with anxiety    Heart murmur    Hyperlipidemia    Allergic rhinitis due to grass pollen     PCP:    Sherlene Shams, MD    REFERRING PROVIDER:    Erick Colace, MD    REFERRING DIAG: M54.16 (ICD-10-CM) - Right lumbar radiculitis   Rationale for Evaluation and Treatment: Rehabilitation  THERAPY DIAG:  Other low back pain  Muscle weakness (generalized)  Difficulty in walking, not elsewhere classified  ONSET DATE: 03/19/2013   SUBJECTIVE:  SUBJECTIVE STATEMENT:  Patient reports back pain is still an 8/10. Pt going to physician Friday to determine if she will continue PT or not.   PERTINENT HISTORY:  HLD, HTN, long history of low back pain.   Pt reports she has had progressing back pain for about 10 years. She reports she has DDD and has been on and off several medications to help with her pain. Pt has just been taking tramadol and tylenol recently. Pt noticed her pain worsened to a point where any position became uncomfortable. Pt reports a burning and tingling sensation that goes down her leg. Pt has not had PT for her back in the past but had it for her knee with some success. Heat pad and tens have been helpful but is only temporary relief. Pt reports no other aches or pains outside of her back.   PAIN:  Are you having pain? Yes: NPRS scale: 8/10 Pain location: Low back, right side  Pain description: Sharp Aggravating factors: prolonged positioning  Relieving factors: heat, tens ( temporary)  PRECAUTIONS: None  RED FLAGS: None   WEIGHT BEARING RESTRICTIONS: No  FALLS:  Has patient fallen in last 6 months? 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 Bending and twisting are a part of her job. 7-4 M-F with some 1/2 days on Fridays.   PLOF: Independent  PATIENT GOALS: To improve her back pain.   NEXT MD VISIT: 04/22/23  OBJECTIVE:  Note: Objective measures were completed at Evaluation unless otherwise noted.  DIAGNOSTIC FINDINGS:  Impression for DGI lumbar from 7/26:  IMPRESSION: 1. Grade 1 anterolisthesis of L5 on S1, likely degenerative. 2. L4-L5 and L5-S1 facet hypertrophy. 3. Multilevel spondylosis with spurring, mild disc space narrowing in the upper lumbar spine.    PATIENT SURVEYS:  Modified Oswestry 40%  FOTO 51  COGNITION: Overall cognitive status: Within functional limits for tasks assessed     SENSATION: WFL   PALPATION: R lateral lumbar paraspinals, unable to palpate gluteal sensitivity or piriformis sensitivity secondary to positioning challenges, pt unable to lye in supine without moaning in pain in clinic.   LUMBAR ROM:   AROM eval  Flexion Hands to mid thigh  Extension No active extension without pain  Right lateral flexion Hands to mid thigh  Left lateral flexion Hands to mid thigh  Right rotation Limited due to pain  Left rotation Limited due to pain   (Blank rows = not tested)    (Blank rows = not tested)  LOWER EXTREMITY MMT:    MMT Right eval Left eval  Hip flexion 4 4  Hip extension    Hip abduction 4 4  Hip adduction 4 4  Hip internal rotation    Hip external rotation    Knee flexion 4+ 4+  Knee extension 4+ 4+  Ankle dorsiflexion    Ankle plantarflexion    Ankle inversion    Ankle eversion     (Blank rows = not tested)  LUMBAR SPECIAL TESTS:  Slump test: Negative  FUNCTIONAL TESTS:  5 times sit to stand: 31.49 sec   GAIT: Distance walked: 10  M Assistive device utilized: None Level of assistance: Complete Independence Comments: slight hip drop bilaterally  TODAY'S TREATMENT:  04/19/23                                                                                                                             TE Nustep level 1-2-1 x 6 min - on lvl 2 for one min then decreased to LE burning  STS 5x   Seated hamstring stretch 60 sec each LE  LE piriformis stretch and figure 4 stretch seated x 60 sec ea LE Standing at steps hip flexor stretch x 60 sec ea  Standing hip abduction 10x, 5x each LE  Seated march 3x10 each LE LAQ 2x10 each LE   TA: Goal reassessment initiated please refer to goal section and assessment below for details. *Only half of MODI filled out this session, will need to be completed if pt continues PT: MODI Pain intensity: 3 Personal Care: 1 Lifting: 1 Walking: 1 Sitting: 1     PATIENT EDUCATION:  Education details: exercise technique Person educated: Patient Education method: Explanation, demo, VC,  Education comprehension: verbalized understanding   HOME EXERCISE PROGRAM: Issued HEP: Instruction to perform as long as pain-limited or pain-free Access Code: [email or web address] URL: [email or web address] Date: 03/28/2023 Prepared by: Temple Pacini  Exercises - Seated March  - 1 x daily - 5 x weekly - 2 sets - 10 reps - Seated Long Arc Quad  - 1 x daily - 5 x weekly - 2 sets - 5-10 reps - Seated Hip Abduction with Resistance  - 1 x daily - 5 x weekly - 2 sets - 10 reps   ASSESSMENT:  CLINICAL IMPRESSION:  Goals reassessed as pt reports today may be last PT day depending on how visit with her physician goes this Friday. Pt continues to present with high levels of baseline pain, and FOTO score indicate decreased perception of functional mobility and QOL. Pt too pain-limited to perform range of motion testing. She did exhibit improvement on 5xSTS, and met goal, although this increased her  pain. The pt will benefit from further skilled PT to improve pain, strength and mobility in order to increase QOL.  OBJECTIVE IMPAIRMENTS: Abnormal gait, decreased activity tolerance, decreased mobility, decreased ROM, decreased strength, impaired perceived functional ability, increased muscle spasms, impaired flexibility, postural dysfunction, and pain.   ACTIVITY LIMITATIONS: carrying, lifting, bending, sitting, standing, squatting, sleeping, and locomotion level  PARTICIPATION LIMITATIONS: cleaning, laundry, driving, shopping, community activity, occupation, and yard work  PERSONAL FACTORS: Fitness, Profession, Time since onset of injury/illness/exacerbation, and 1-2 comorbidities: HTN, HLD  are also affecting patient's functional outcome.   REHAB POTENTIAL: Fair time since onset, fitness level  CLINICAL DECISION MAKING: Stable/uncomplicated  EVALUATION COMPLEXITY: Low   GOALS: Goals reviewed with patient? Yes  SHORT TERM GOALS: Target date: 04/16/2023       Patient will be independent in home exercise program to improve strength/mobility for better functional independence with ADLs. Baseline: No HEP currently  Goal status: INITIAL   LONG TERM GOALS: Target date: 05/07/2023  Pt will improve FOTO score to 58 or greater in order to indicate clinically significant improvement in overall function as a result of her back limitations  Baseline: 51; 04/19/23: 45  Goal status: ONGOING  2.  Pt will improve MODI  score to 10/50 or less in order to indicate clinically significant improvement in overall function as a result of her back limitations  Baseline: 20/50 Goal status: ONGOING  3.  Pt will improve 5X STS to 18 sec or less in order to indicate improved function with transfers and improved LE strength and power.  Baseline: 32.49 sec; 10/31: 18 sec, still pain-limited (reports increased pain to 10) Goal status: MET  4.  Patient will perform lumbar flexion, sidebending,  and rotation, to within the tolerance of her spinal mobility with no reports of increased pain to allow for improved functional mobility Baseline: Significant pain with all of the above movements with significant limitation secondary to pain; 10/31 pt with high levels of LBP at baseline 8/10, testing deferred to prevent further increase in LBP Goal status: ONGOING   PLAN:  PT FREQUENCY: 1-2x/week  PT DURATION: 6 weeks  PLANNED INTERVENTIONS: Therapeutic exercises, Therapeutic activity, Neuromuscular re-education, Balance training, Gait training, Patient/Family education, Self Care, Joint mobilization, Joint manipulation, Dry Needling, Spinal manipulation, Spinal mobilization, Moist heat, and Manual therapy.  PLAN FOR NEXT SESSION: Seated therex and mobility (hooklye, supine, and side-lye current significantly limited by pain) Discuss discharge if PT continues to be ineffective or worsens pain pending discussion between MD and pt. Baird Kay, PT, DPT 04/19/2023, 5:04 PM

## 2023-04-20 ENCOUNTER — Encounter: Payer: Self-pay | Admitting: Physical Medicine & Rehabilitation

## 2023-04-20 ENCOUNTER — Encounter: Payer: 59 | Attending: Physical Medicine & Rehabilitation | Admitting: Physical Medicine & Rehabilitation

## 2023-04-20 VITALS — BP 130/82 | HR 62 | Ht 63.0 in | Wt 200.0 lb

## 2023-04-20 DIAGNOSIS — M5416 Radiculopathy, lumbar region: Secondary | ICD-10-CM | POA: Diagnosis not present

## 2023-04-20 MED ORDER — PREGABALIN 75 MG PO CAPS: 75.0000 mg | ORAL_CAPSULE | Freq: Two times a day (BID) | ORAL | 1 refills | Status: DC

## 2023-04-20 NOTE — Progress Notes (Signed)
Subjective:    Patient ID: Melanie Fisher, female    DOB: 09/02/1965, 57 y.o.   MRN: [id number]  HPI 57 year old female with 10-year history of low back pain now with approximately 6 to 92-month history of right lower extremity pain.  She was initially seen in this clinic January 05 2023.  Patient referred by her primary care physician she had tried Tylenol up to 6 tablets/day for pain without much relief Celebrex was prescribed by primary care but this was discontinued due to kidney function problems. Gabapentin was started at 100 mg 3 times a day at the initial visit.  The patient was referred for x-rays of the lumbar spine which showed spondylosis but intact disc spaces at L4-5 L5-S1 no evidence of spondylolisthesis.  Also hip films were performed which showed no evidence of significant degenerative changes.  Due to lack of pain relief gabapentin was escalated to 300 mg 3 times daily and eventually to 600 mg 3 times daily.  In addition patient has been prescribed tramadol by her primary care physician.  Despite medication management no improvement of pain.  Patient was referred to physical therapy has been attending from 03/26/2023 until today.  She has had 8-10 out of 10 pain in her low back and right lower extremity.  No progressive weakness no new bowel or bladder dysfunction. Patient states for about 2 days after therapy she experiences increasing pain.  Increased Right sided sciatic pain , worsened for a couple days after therapy  No weakness in leg, just gets tired  No new bowel or bladder issues   Pain Inventory Average Pain 8 Pain Right Now 8 My pain is sharp, burning, dull, stabbing, and tingling  In the last 24 hours, has pain interfered with the following? General activity 10 Relation with others 10 Enjoyment of life 10 What TIME of day is your pain at its worst? morning , daytime, evening, and night Sleep (in general) Fair  Pain is worse with: walking, bending, sitting, and  standing Pain improves with: rest and TENS Relief from Meds: 0  Family History  Problem Relation Age of Onset   Kidney disease Mother 73       ESKD, partial neprhectomy   COPD Mother    Heart disease Father        CABG at age 28   Alcohol abuse Father        until late 47   Diabetes Father    Hypertension Father    Heart disease Sister 34       first MI at age 55   Diabetes Sister    Cancer Sister        uterine ca   Stroke Brother 35   Cancer Maternal Uncle 60       stage 4 colon CA   Colon cancer Maternal Uncle    Esophageal cancer Neg Hx    Pancreatic cancer Neg Hx    Rectal cancer Neg Hx    Stomach cancer Neg Hx    Breast cancer Neg Hx    Social History   Socioeconomic History   Marital status: Married    Spouse name: Not on file   Number of children: Not on file   Years of education: Not on file   Highest education level: Not on file  Occupational History   Occupation: [employer]  Tobacco Use   Smoking status: Former    Current packs/day: 0.00    Average packs/day: 0.5 packs/day for 21.0 years (  10.5 ttl pk-yrs)    Types: Cigarettes    Start date: 08/1999    Quit date: 08/2020    Years since quitting: 2.6   Smokeless tobacco: Never  Vaping Use   Vaping status: Never Used  Substance and Sexual Activity   Alcohol use: Not Currently    Comment: quit drinking    Drug use: No   Sexual activity: Not Currently  Other Topics Concern   Not on file  Social History Narrative   Married as of 05/30/20   Former smoker quit 07/29/19 cigarettes    Social Determinants of Health   Financial Resource Strain: Unknown (01/28/2021)   Overall Financial Resource Strain (CARDIA)    Difficulty of Paying Living Expenses: Patient declined  Food Insecurity: Unknown (01/28/2021)   Hunger Vital Sign    Worried About Running Out of Food in the Last Year: Patient declined    Ran Out of Food in the Last Year: Patient declined  Transportation Needs: Unknown (01/28/2021)   PRAPARE -  Transportation    Lack of Transportation (Medical): Patient declined    Lack of Transportation (Non-Medical): Patient declined  Physical Activity: Sufficiently Active (01/28/2021)   Exercise Vital Sign    Days of Exercise per Week: 3 days    Minutes of Exercise per Session: 50 min  Stress: Unknown (01/28/2021)   [employer] of Occupational Health - Occupational Stress Questionnaire    Feeling of Stress : Patient declined  Social Connections: Socially Integrated (01/28/2021)   Social Connection and Isolation Panel [NHANES]    Frequency of Communication with Friends and Family: More than three times a week    Frequency of Social Gatherings with Friends and Family: Three times a week    Attends Religious Services: 1 to 4 times per year    Active Member of Clubs or Organizations: Yes    Attends Banker Meetings: Patient declined    Marital Status: Married   Past Surgical History:  Procedure Laterality Date   ABDOMINAL HYSTERECTOMY  2008   partial   COLONOSCOPY WITH PROPOFOL N/A 06/04/2018   Procedure: COLONOSCOPY WITH PROPOFOL;  Surgeon: Wyline Mood, MD;  Location: [facility] ENDOSCOPY;  Service: Gastroenterology;  Laterality: N/A;   right toe bunionectomy     right wrist ganglion cyst removal     Past Surgical History:  Procedure Laterality Date   ABDOMINAL HYSTERECTOMY  2008   partial   COLONOSCOPY WITH PROPOFOL N/A 06/04/2018   Procedure: COLONOSCOPY WITH PROPOFOL;  Surgeon: Wyline Mood, MD;  Location: [facility] ENDOSCOPY;  Service: Gastroenterology;  Laterality: N/A;   right toe bunionectomy     right wrist ganglion cyst removal     Past Medical History:  Diagnosis Date   Allergy    Anxiety    Asthma    Depression    GERD (gastroesophageal reflux disease)    Heart murmur    Hyperlipidemia    Hypertension    Sleep apnea    Tobacco abuse    BP 130/82   Pulse 62   Ht 5\' 3"  (1.6 m)   Wt 200 lb (90.7 kg)   SpO2 98%   BMI 35.43 kg/m   Opioid Risk Score:    Fall Risk Score:  `1  Depression screen PHQ 2/9     04/20/2023    3:25 PM 03/22/2023    2:49 PM 03/16/2023   11:19 AM 02/09/2023    1:48 PM 01/05/2023   10:40 AM 10/17/2022    3:49 PM 10/17/2022  3:48 PM  Depression screen PHQ 2/9  Decreased Interest 0 0 0 1 0 0 0  Down, Depressed, Hopeless 0 0 0 1 0 0 0  PHQ - 2 Score 0 0 0 2 0 0 0  Altered sleeping     0 0   Tired, decreased energy     0 0   Change in appetite     0 0   Feeling bad or failure about yourself      0 0   Trouble concentrating     0 0   Moving slowly or fidgety/restless     0 0   Suicidal thoughts     0 0   PHQ-9 Score     0 0   Difficult doing work/chores      Not difficult at all      Review of Systems  Musculoskeletal:  Positive for back pain and gait problem.  All other systems reviewed and are negative.     Objective:   Physical Exam General No acute distress Mood and affect are appropriate Extremities without edema Negative straight leg raise bilaterally Ambulates without assistive device no evidence of toe drag or knee instability  Reduced pinprick sensation Bilateral L2, L3 and L4 as well as Right L5   DTR  1/5 Left achilles, 2/5 Right achilles 2/5 B Knees  Motor strength is 5/5 bilateral hip flexor knee extensor ankle dorsiflexor and plantar flexor Lumbar spine his 50% forward flexion 25% extension endrange is painful in both directions. No pain with hip range of motion bilaterally with internal and external rotation.    Assessment & Plan:   #1.  Chronic low back pain with subacute sciatica has been on medication management including gabapentin, tramadol, Celebrex without relief.  Has tried physical therapy over the last month without relief. Will send for MRI lumbar spine question herniated disc versus lumbar spinal stenosis. Back after this. In the meantime we will reduce gabapentin to 300 mg 3 times daily x 2 days then stop and start pregabalin 75 mg twice daily.  Patient may require  epidural steroid injection but will need MRI to further delineate nerve root impingement

## 2023-04-20 NOTE — Patient Instructions (Signed)
Reduce Gabapentin to 300mg  (1 tab) 3 times a day for 2 d then start Lyrica (pregabalin)

## 2023-04-23 ENCOUNTER — Other Ambulatory Visit: Payer: Self-pay | Admitting: Internal Medicine

## 2023-04-23 ENCOUNTER — Ambulatory Visit: Payer: 59

## 2023-04-25 ENCOUNTER — Ambulatory Visit: Payer: 59 | Admitting: Physical Therapy

## 2023-04-26 ENCOUNTER — Encounter: Payer: Self-pay | Admitting: Physical Medicine & Rehabilitation

## 2023-04-27 ENCOUNTER — Encounter: Payer: Self-pay | Admitting: Physical Medicine & Rehabilitation

## 2023-04-30 ENCOUNTER — Ambulatory Visit: Payer: 59

## 2023-05-02 ENCOUNTER — Ambulatory Visit: Payer: 59

## 2023-05-02 ENCOUNTER — Inpatient Hospital Stay
Admission: RE | Admit: 2023-05-02 | Discharge: 2023-05-02 | Discharge status: Home / Self Care | Payer: 59 | Source: Ambulatory Visit | Attending: Physical Medicine & Rehabilitation | Admitting: Physical Medicine & Rehabilitation

## 2023-05-02 DIAGNOSIS — M5416 Radiculopathy, lumbar region: Secondary | ICD-10-CM | POA: Diagnosis not present

## 2023-05-08 ENCOUNTER — Encounter: Payer: 59 | Admitting: Physical Therapy

## 2023-05-10 ENCOUNTER — Encounter: Payer: 59 | Admitting: Physical Therapy

## 2023-05-25 ENCOUNTER — Encounter: Payer: 59 | Attending: Physical Medicine & Rehabilitation | Admitting: Physical Medicine & Rehabilitation

## 2023-05-25 ENCOUNTER — Encounter: Payer: Self-pay | Admitting: Physical Medicine & Rehabilitation

## 2023-05-25 VITALS — BP 125/82 | HR 64 | Ht 63.0 in | Wt 198.0 lb

## 2023-05-25 DIAGNOSIS — M47817 Spondylosis without myelopathy or radiculopathy, lumbosacral region: Secondary | ICD-10-CM | POA: Diagnosis not present

## 2023-05-25 NOTE — Progress Notes (Signed)
Subjective:    Patient ID: Melanie Fisher, female    DOB: 29-Oct-1965, 57 y.o.   MRN: [id number]  HPI 57 year old female with history of low back pain as well as lower extremity pain.  She has been through outpatient physical therapy within the last 3 months.  She has tried over-the-counter medications as well as prescription medications for both neurogenic pain as well as mechanical low back pain.  She has not had much relief.  She went for MRI of the lumbar spine which was performed earlier this month.  This showed no compressive lesions in the lumbar spine no foraminal stenosis of any significance.  There was moderate lumbar spondylosis and facet joints L5-S1 greater than L4-5.  The actual films were reviewed independently as well No progressive lower extremity weakness no numbness tingling in the lower extremities Patient remains functionally independent with all self-care and mobility she does not require an assistive device. Pain Inventory Average Pain 8 Pain Right Now 8 My pain is sharp, burning, dull, stabbing, tingling, and aching  In the last 24 hours, has pain interfered with the following? General activity 10 Relation with others 10 Enjoyment of life 10 What TIME of day is your pain at its worst? morning , daytime, evening, and night Sleep (in general) Fair  Pain is worse with: walking, bending, sitting, standing, and some activites Pain improves with: rest and TENS Relief from Meds: 0  Family History  Problem Relation Age of Onset   Kidney disease Mother 6       ESKD, partial neprhectomy   COPD Mother    Heart disease Father        CABG at age 26   Alcohol abuse Father        until late 9   Diabetes Father    Hypertension Father    Heart disease Sister 66       first MI at age 11   Diabetes Sister    Cancer Sister        uterine ca   Stroke Brother 34   Cancer Maternal Uncle 60       stage 4 colon CA   Colon cancer Maternal Uncle    Esophageal cancer Neg  Hx    Pancreatic cancer Neg Hx    Rectal cancer Neg Hx    Stomach cancer Neg Hx    Breast cancer Neg Hx    Social History   Socioeconomic History   Marital status: Married    Spouse name: Not on file   Number of children: Not on file   Years of education: Not on file   Highest education level: Not on file  Occupational History   Occupation: [employer]  Tobacco Use   Smoking status: Former    Current packs/day: 0.00    Average packs/day: 0.5 packs/day for 21.0 years (10.5 ttl pk-yrs)    Types: Cigarettes    Start date: 08/1999    Quit date: 08/2020    Years since quitting: 2.7   Smokeless tobacco: Never  Vaping Use   Vaping status: Never Used  Substance and Sexual Activity   Alcohol use: Not Currently    Comment: quit drinking    Drug use: No   Sexual activity: Not Currently  Other Topics Concern   Not on file  Social History Narrative   Married as of 05/30/20   Former smoker quit 07/29/19 cigarettes    Social Determinants of Health   Financial Resource Strain: Unknown (01/28/2021)  Overall Financial Resource Strain (CARDIA)    Difficulty of Paying Living Expenses: Patient declined  Food Insecurity: Unknown (01/28/2021)   Hunger Vital Sign    Worried About Running Out of Food in the Last Year: Patient declined    Ran Out of Food in the Last Year: Patient declined  Transportation Needs: Unknown (01/28/2021)   PRAPARE - Transportation    Lack of Transportation (Medical): Patient declined    Lack of Transportation (Non-Medical): Patient declined  Physical Activity: Sufficiently Active (01/28/2021)   Exercise Vital Sign    Days of Exercise per Week: 3 days    Minutes of Exercise per Session: 50 min  Stress: Unknown (01/28/2021)   [employer] of Occupational Health - Occupational Stress Questionnaire    Feeling of Stress : Patient declined  Social Connections: Socially Integrated (01/28/2021)   Social Connection and Isolation Panel [NHANES]    Frequency of  Communication with Friends and Family: More than three times a week    Frequency of Social Gatherings with Friends and Family: Three times a week    Attends Religious Services: 1 to 4 times per year    Active Member of Clubs or Organizations: Yes    Attends Banker Meetings: Patient declined    Marital Status: Married   Past Surgical History:  Procedure Laterality Date   ABDOMINAL HYSTERECTOMY  2008   partial   COLONOSCOPY WITH PROPOFOL N/A 06/04/2018   Procedure: COLONOSCOPY WITH PROPOFOL;  Surgeon: Wyline Mood, MD;  Location: [facility] ENDOSCOPY;  Service: Gastroenterology;  Laterality: N/A;   right toe bunionectomy     right wrist ganglion cyst removal     Past Surgical History:  Procedure Laterality Date   ABDOMINAL HYSTERECTOMY  2008   partial   COLONOSCOPY WITH PROPOFOL N/A 06/04/2018   Procedure: COLONOSCOPY WITH PROPOFOL;  Surgeon: Wyline Mood, MD;  Location: [facility] ENDOSCOPY;  Service: Gastroenterology;  Laterality: N/A;   right toe bunionectomy     right wrist ganglion cyst removal     Past Medical History:  Diagnosis Date   Allergy    Anxiety    Asthma    Depression    GERD (gastroesophageal reflux disease)    Heart murmur    Hyperlipidemia    Hypertension    Sleep apnea    Tobacco abuse    BP 125/82   Pulse 64   Ht 5\' 3"  (1.6 m)   Wt 198 lb (89.8 kg)   SpO2 98%   BMI 35.07 kg/m   Opioid Risk Score:   Fall Risk Score:  `1  Depression screen PHQ 2/9     05/25/2023    2:05 PM 04/20/2023    3:25 PM 03/22/2023    2:49 PM 03/16/2023   11:19 AM 02/09/2023    1:48 PM 01/05/2023   10:40 AM 10/17/2022    3:49 PM  Depression screen PHQ 2/9  Decreased Interest 0 0 0 0 1 0 0  Down, Depressed, Hopeless 0 0 0 0 1 0 0  PHQ - 2 Score 0 0 0 0 2 0 0  Altered sleeping      0 0  Tired, decreased energy      0 0  Change in appetite      0 0  Feeling bad or failure about yourself       0 0  Trouble concentrating      0 0  Moving slowly or fidgety/restless       0 0  Suicidal  thoughts      0 0  PHQ-9 Score      0 0  Difficult doing work/chores       Not difficult at all      Review of Systems  Musculoskeletal:  Positive for back pain and gait problem.  All other systems reviewed and are negative.     Objective:   Physical Exam  General No acute distress Mood and affect appropriate Lower extremity strength is 5/5 bilateral hip flexor knee extensor ankle dorsiflexor Negative straight leg raising bilaterally Sensation normal bilateral lower extremities Tone is normal bilateral lower extremities Ambulates without assistive device no evidence of toe drag and instability Hip knee and ankle range of motion is full. SI joint testing is negative Lumbar spine has reduced range of motion 50% flexion 25% extension extension is more painful than flexion.  She has tenderness lumbar paraspinal area particularly at lumbosacral junction.      Assessment & Plan:  1.  Chronic low back pain with lumbar facet arthropathy lower lumbar levels correlating with pain during standing and extension.  Failed physical therapy as well as medication management.  Will schedule for medial branch blocks.  L3-4-5 we discussed that if the patient gets good temporary relief this would need to be repeated and then if she has good temporary relief of greater than 80% once again she can go on to RFA. 2.  Lower extremity paresthesias no compressive lesions.  No history of neuropathy.  Will increase pregabalin to 100 mg 3 times daily she has 75 mg currently she will use these up.  I will see her back for the medial branch blocks.

## 2023-05-30 ENCOUNTER — Encounter: Payer: 59 | Admitting: Physical Therapy

## 2023-06-15 ENCOUNTER — Telehealth: Payer: Self-pay

## 2023-06-15 MED ORDER — PREGABALIN 100 MG PO CAPS: 100.0000 mg | ORAL_CAPSULE | Freq: Three times a day (TID) | ORAL | 5 refills | Status: DC

## 2023-06-15 NOTE — Telephone Encounter (Signed)
Patient called in stating that she has finished the pregabalin 75mg  and asking if you can send in the 300 mg to the [facility] in [place]

## 2023-06-18 ENCOUNTER — Encounter: Payer: 59 | Admitting: Physical Therapy

## 2023-06-21 ENCOUNTER — Telehealth: Payer: Self-pay

## 2023-06-21 ENCOUNTER — Other Ambulatory Visit: Payer: Self-pay

## 2023-06-21 ENCOUNTER — Encounter: Payer: Self-pay | Admitting: Internal Medicine

## 2023-06-21 ENCOUNTER — Encounter: Payer: 59 | Admitting: Physical Therapy

## 2023-06-21 ENCOUNTER — Ambulatory Visit: Payer: 59 | Admitting: Internal Medicine

## 2023-06-21 VITALS — BP 128/76 | HR 69 | Ht 63.0 in | Wt 203.0 lb

## 2023-06-21 DIAGNOSIS — R7303 Prediabetes: Secondary | ICD-10-CM

## 2023-06-21 DIAGNOSIS — M5441 Lumbago with sciatica, right side: Secondary | ICD-10-CM

## 2023-06-21 DIAGNOSIS — I10 Essential (primary) hypertension: Secondary | ICD-10-CM | POA: Diagnosis not present

## 2023-06-21 DIAGNOSIS — F418 Other specified anxiety disorders: Secondary | ICD-10-CM | POA: Diagnosis not present

## 2023-06-21 DIAGNOSIS — Z1211 Encounter for screening for malignant neoplasm of colon: Secondary | ICD-10-CM

## 2023-06-21 DIAGNOSIS — G4733 Obstructive sleep apnea (adult) (pediatric): Secondary | ICD-10-CM | POA: Diagnosis not present

## 2023-06-21 DIAGNOSIS — G8929 Other chronic pain: Secondary | ICD-10-CM

## 2023-06-21 DIAGNOSIS — Z23 Encounter for immunization: Secondary | ICD-10-CM

## 2023-06-21 DIAGNOSIS — E7800 Pure hypercholesterolemia, unspecified: Secondary | ICD-10-CM | POA: Diagnosis not present

## 2023-06-21 DIAGNOSIS — Z87891 Personal history of nicotine dependence: Secondary | ICD-10-CM | POA: Diagnosis not present

## 2023-06-21 DIAGNOSIS — Z860100 Personal history of colon polyps, unspecified: Secondary | ICD-10-CM

## 2023-06-21 LAB — HEMOGLOBIN A1C: Hgb A1c MFr Bld: 6.1 % (ref 4.6–6.5)

## 2023-06-21 LAB — COMPREHENSIVE METABOLIC PANEL
ALT: 15 U/L (ref 0–35)
AST: 18 U/L (ref 0–37)
Albumin: 4.7 g/dL (ref 3.5–5.2)
Alkaline Phosphatase: 78 U/L (ref 39–117)
BUN: 31 mg/dL — ABNORMAL HIGH (ref 6–23)
CO2: 26 meq/L (ref 19–32)
Calcium: 10 mg/dL (ref 8.4–10.5)
Chloride: 104 meq/L (ref 96–112)
Creatinine, Ser: 0.95 mg/dL (ref 0.40–1.20)
GFR: 65.87 mL/min (ref >60.00)
Glucose, Bld: 81 mg/dL (ref 70–99)
Potassium: 4.3 meq/L (ref 3.5–5.1)
Sodium: 139 meq/L (ref 135–145)
Total Bilirubin: 0.4 mg/dL (ref 0.2–1.2)
Total Protein: 7.9 g/dL (ref 6.0–8.3)

## 2023-06-21 LAB — LIPID PANEL
Cholesterol: 223 mg/dL — ABNORMAL HIGH (ref 0–200)
HDL: 65.5 mg/dL (ref >39.00)
LDL Cholesterol: 140 mg/dL — ABNORMAL HIGH (ref 0–99)
NonHDL: 157.07
Total CHOL/HDL Ratio: 3
Triglycerides: 83 mg/dL (ref 0.0–149.0)
VLDL: 16.6 mg/dL (ref 0.0–40.0)

## 2023-06-21 LAB — LDL CHOLESTEROL, DIRECT: Direct LDL: 143 mg/dL

## 2023-06-21 MED ORDER — NA SULFATE-K SULFATE-MG SULF 17.5-3.13-1.6 GM/177ML PO SOLN: 1.0000 | Freq: Once | ORAL | 0 refills | Status: AC

## 2023-06-21 MED ORDER — SERTRALINE HCL 100 MG PO TABS: 100.0000 mg | ORAL_TABLET | Freq: Every day | ORAL | 1 refills | Status: DC

## 2023-06-21 NOTE — Progress Notes (Signed)
 Subjective:  Patient ID: Melanie Fisher, female    DOB: [birth year]  Age: 58 y.o. MRN: [id number]  CC: The primary encounter diagnosis was Colon cancer screening. Diagnoses of Chronic bilateral low back pain with right-sided sciatica, Need for Tdap vaccination, Essential hypertension, Prediabetes, Pure hypercholesterolemia, Depression with anxiety, History of tobacco abuse, and OSA (obstructive sleep apnea) were also pertinent to this visit.   HPI Melanie Fisher presents for  Chief Complaint  Patient presents with   Medical Management of Chronic Issues   1) HISTORY OF OSA:  HAS OT WORNE CPAP IN 10 YEARS.   NO LONGETR WAKING UP SNROIG  NO ALCOHOL IN 4 YEARS.  NO TOBACCO IN 1 YEAR S    HTN: taking amlodipine , losartan  and metoprolol    3) Depression:  she is tearful today.  Her depression is worse and aggravated by  the coflict between her two remaining adult sons. (Oldest son died of an overdose . Melanie Fisher 2022-05-28)  Her youger som Melanie Fisher , 30   had a  SUICIDE ATTEMPT (UNSUCCESSFUL OVERDOSE ON BORROWED DRUGS) . Son was revived with Narcan given by emergency planning/management officer who responded to 911 call.  Son is homeless . Middle son, 48 , soon to be A FATHER,   diagnosed with bipolar disorder in November during an involuntary commitment by his younger brother after abusing whipit    4) CHRONIC LOW BACK PAIN:  DID NOT IMPROVED WITH PT. HAD MRI.  NO STENOSIS,  TAKING GABAPENTIN  ,  TRAMADOL  STOPPED.  SHE IS  SCHEDULED FOR EPIDURAL SPINAL INJECTIONS AND RIGHT HIP INTRARTICULAR INJECTIONS IN THE FUTURE     Outpatient Medications Prior to Visit  Medication Sig Dispense Refill   acyclovir  (ZOVIRAX ) 400 MG tablet TAKE 1 TABLET BY MOUTH EVERY DAY FOR PREVENTION OR OUTBREAK 90 tablet 1   amLODipine  (NORVASC ) 10 MG tablet TAKE 1 TABLET(10 MG) BY MOUTH DAILY 90 tablet 1   diazepam  (VALIUM ) 5 MG tablet Take 1 tablet (5 mg total) by mouth at bedtime as needed for anxiety. 30 tablet 1   losartan  (COZAAR ) 100  MG tablet TAKE 1 TABLET(100 MG) BY MOUTH DAILY 90 tablet 1   metoprolol  succinate (TOPROL -XL) 50 MG 24 hr tablet TAKE 1 TABLET BY MOUTH EVERY DAY 90 tablet 1   omeprazole  (PRILOSEC) 40 MG capsule TAKE 1 CAPSULE(40 MG) BY MOUTH DAILY 90 capsule 3   pregabalin  (LYRICA ) 100 MG capsule Take 1 capsule (100 mg total) by mouth 3 (three) times daily. 90 capsule 5   triamcinolone  cream (KENALOG ) 0.1 % Apply 1 Application topically 2 (two) times daily. 30 g 0   sertraline  (ZOLOFT ) 50 MG tablet Take 1 tablet (50 mg total) by mouth daily. 90 tablet 1   traMADol  (ULTRAM ) 50 MG tablet TAKE 1 TABLET(50 MG) BY MOUTH EVERY 12 HOURS AS NEEDED FOR MODERATE PAIN (Patient not taking: Reported on 06/21/2023) 60 tablet 5   No facility-administered medications prior to visit.    Review of Systems;  Patient denies headache, fevers, malaise, unintentional weight loss, skin rash, eye pain, sinus congestion and sinus pain, sore throat, dysphagia,  hemoptysis , cough, dyspnea, wheezing, chest pain, palpitations, orthopnea, edema, abdominal pain, nausea, melena, diarrhea, constipation, flank pain, dysuria, hematuria, urinary  Frequency, nocturia, numbness, tingling, seizures,  Focal weakness, Loss of consciousness,  Tremor, insomnia, depression, anxiety, and suicidal ideation.      Objective:  BP 128/76   Pulse 69   Ht 5' 3 (1.6 m)   Wt 203  lb (92.1 kg)   SpO2 95%   BMI 35.96 kg/m   BP Readings from Last 3 Encounters:  06/21/23 128/76  05/25/23 125/82  04/20/23 130/82    Wt Readings from Last 3 Encounters:  06/21/23 203 lb (92.1 kg)  05/25/23 198 lb (89.8 kg)  04/20/23 200 lb (90.7 kg)    Physical Exam Vitals reviewed.  Constitutional:      General: She is not in acute distress.    Appearance: Normal appearance. She is normal weight. She is not ill-appearing, toxic-appearing or diaphoretic.  HENT:     Head: Normocephalic.  Eyes:     General: No scleral icterus.       Right eye: No discharge.         Left eye: No discharge.     Conjunctiva/sclera: Conjunctivae normal.  Cardiovascular:     Rate and Rhythm: Normal rate and regular rhythm.     Heart sounds: Normal heart sounds.  Pulmonary:     Effort: Pulmonary effort is normal. No respiratory distress.     Breath sounds: Normal breath sounds.  Musculoskeletal:        General: Normal range of motion.     Cervical back: Tenderness present.  Skin:    General: Skin is warm and dry.  Neurological:     General: No focal deficit present.     Mental Status: She is alert and oriented to person, place, and time. Mental status is at baseline.  Psychiatric:        Mood and Affect: Mood normal.        Behavior: Behavior normal.        Thought Content: Thought content normal.        Judgment: Judgment normal.    Lab Results  Component Value Date   HGBA1C 6.1 06/21/2023   HGBA1C 5.8 10/12/2022   HGBA1C 6.1 09/26/2021    Lab Results  Component Value Date   CREATININE 0.95 06/21/2023   CREATININE 1.09 11/02/2022   CREATININE 1.41 (H) 10/17/2022    Lab Results  Component Value Date   WBC 6.3 10/12/2022   HGB 12.1 10/12/2022   HCT 36.5 10/12/2022   PLT 230.0 10/12/2022   GLUCOSE 81 06/21/2023   CHOL 223 (H) 06/21/2023   TRIG 83.0 06/21/2023   HDL 65.50 06/21/2023   LDLDIRECT 143.0 06/21/2023   LDLCALC 140 (H) 06/21/2023   ALT 15 06/21/2023   AST 18 06/21/2023   NA 139 06/21/2023   K 4.3 06/21/2023   CL 104 06/21/2023   CREATININE 0.95 06/21/2023   BUN 31 (H) 06/21/2023   CO2 26 06/21/2023   TSH 4.49 10/12/2022   HGBA1C 6.1 06/21/2023   MICROALBUR <0.7 10/12/2022    MR LUMBAR SPINE WO CONTRAST Result Date: 05/23/2023 CLINICAL DATA:  Right lumbar radiculitis, helical radiculopathy for over 6 weeks EXAM: MRI LUMBAR SPINE WITHOUT CONTRAST TECHNIQUE: Multiplanar, multisequence MR imaging of the lumbar spine was performed. No intravenous contrast was administered. COMPARISON:  Lumbar x-ray 01/12/2023 FINDINGS: Segmentation:   Standard. Alignment: Minimal grade 1 anterolisthesis of L5 on S1 secondary to facet disease. Vertebrae: No acute fracture, evidence of discitis, or aggressive bone lesion. Conus medullaris and cauda equina: Conus extends to the T12-L1 level. Conus and cauda equina appear normal. Paraspinal and other soft tissues: No acute paraspinal abnormality. Disc levels: Disc spaces: Minimal disc height loss at L1-2. T12-L1: Mild broad-based disc bulge. Mild bilateral facet arthropathy. No foraminal or central canal stenosis. L1-L2: Mild broad-based disc bulge. Mild  bilateral facet arthropathy. No foraminal or central canal stenosis. L2-L3: Mild disc bulge. Mild bilateral facet arthropathy. Mild central canal stenosis. No foraminal stenosis. L3-L4: Mild disc bulge. Mild bilateral facet arthropathy. No foraminal stenosis. Mild central canal stenosis. L4-L5: Minimal disc bulge. Mild bilateral facet arthropathy. Mild spinal stenosis and bilateral lateral recess stenosis. Mild bilateral foraminal stenosis. L5-S1: Mild disc bulge. Moderate bilateral facet arthropathy. No foraminal or central canal stenosis. IMPRESSION: 1. Mild lumbar spine spondylosis as described above. 2. No acute osseous injury of the lumbar spine. Electronically Signed   By: Julaine Blanch M.D.   On: 05/23/2023 13:53    Assessment & Plan:  .Colon cancer screening -     Ambulatory referral to Gastroenterology  Chronic bilateral low back pain with right-sided sciatica Assessment & Plan: She has been under treatment  with PT by Kirsteins,  with no significant improvement .  MRI done in Nov.  Dec   OV reviewed:   MRI of the lumbar spine was performed  and showed no compressive lesions in the lumbar spine,  no foraminal stenosis of any significance.  There was moderate lumbar spondylosis and facet joints L5-S1 greater than L4-5.  No progressive lower extremity weakness no numbness tingling in the lower extremities. Patient remains functionally independent  with all self-care and mobility she does not require an assistive device.   Need for Tdap vaccination -     Tdap vaccine greater than or equal to 7yo IM  Essential hypertension Assessment & Plan: Well controlled on current regimen of amlodipine , losartan  and metoprolol  . Renal function stable, no changes today.   Lab Results  Component Value Date   CREATININE 0.95 06/21/2023   Lab Results  Component Value Date   NA 139 06/21/2023   K 4.3 06/21/2023   CL 104 06/21/2023   CO2 26 06/21/2023     Orders: -     Comprehensive metabolic panel  Prediabetes -     Comprehensive metabolic panel -     Hemoglobin A1c  Pure hypercholesterolemia -     Lipid panel -     LDL cholesterol, direct  Depression with anxiety Assessment & Plan: Symptoms controlled on current therapy but she is working hard to resist the emotioanl manipulation  by her two drug abusing sons.  Recommending  [facility]   History of tobacco abuse Assessment & Plan: Has not smoked  In 1 year    OSA (obstructive sleep apnea) Assessment & Plan: Untreated for years due to mask intolerance.    Other orders -     Sertraline  HCl; Take 1 tablet (100 mg total) by mouth daily.  Dispense: 90 tablet; Refill: 1     I provided 35 minutes of face-to-face time during this encounter reviewing patient's last visit with me, patient's  most recent visit with physical rehab,   recent surgical and non surgical procedures, previous  labs and imaging studies, counseling on currently addressed issues,  and post visit ordering to diagnostics and therapeutics .   Follow-up: Return in about 6 months (around 12/19/2023).   Verneita LITTIE Kettering, MD

## 2023-06-21 NOTE — Assessment & Plan Note (Signed)
 Has not smoked  In 1 year

## 2023-06-21 NOTE — Telephone Encounter (Signed)
 Gastroenterology Pre-Procedure Review  Request Date: 07/27/23 Requesting Physician: Dr. Therisa  PATIENT REVIEW QUESTIONS: The patient responded to the following health history questions as indicated:    1. Are you having any GI issues? no 2. Do you have a personal history of Polyps? yes (last colonoscopy 06/04/2018 performed by Dr. Therisa ) 3. Do you have a family history of Colon Cancer or Polyps? no 4. Diabetes Mellitus? no 5. Joint replacements in the past 12 months?no 6. Major health problems in the past 3 months?no 7. Any artificial heart valves, MVP, or defibrillator?no    MEDICATIONS & ALLERGIES:    Patient reports the following regarding taking any anticoagulation/antiplatelet therapy:   Plavix, Coumadin, Eliquis, Xarelto, Lovenox, Pradaxa, Brilinta, or Effient? no Aspirin? no  Patient confirms/reports the following medications:  Current Outpatient Medications  Medication Sig Dispense Refill   acyclovir  (ZOVIRAX ) 400 MG tablet TAKE 1 TABLET BY MOUTH EVERY DAY FOR PREVENTION OR OUTBREAK 90 tablet 1   amLODipine  (NORVASC ) 10 MG tablet TAKE 1 TABLET(10 MG) BY MOUTH DAILY 90 tablet 1   diazepam  (VALIUM ) 5 MG tablet Take 1 tablet (5 mg total) by mouth at bedtime as needed for anxiety. 30 tablet 1   losartan  (COZAAR ) 100 MG tablet TAKE 1 TABLET(100 MG) BY MOUTH DAILY 90 tablet 1   metoprolol  succinate (TOPROL -XL) 50 MG 24 hr tablet TAKE 1 TABLET BY MOUTH EVERY DAY 90 tablet 1   Na Sulfate-K Sulfate-Mg Sulf 17.5-3.13-1.6 GM/177ML SOLN Take 1 kit by mouth once for 1 dose. 354 mL 0   omeprazole  (PRILOSEC) 40 MG capsule TAKE 1 CAPSULE(40 MG) BY MOUTH DAILY 90 capsule 3   pregabalin  (LYRICA ) 100 MG capsule Take 1 capsule (100 mg total) by mouth 3 (three) times daily. 90 capsule 5   sertraline  (ZOLOFT ) 100 MG tablet Take 1 tablet (100 mg total) by mouth daily. 90 tablet 1   triamcinolone  cream (KENALOG ) 0.1 % Apply 1 Application topically 2 (two) times daily. 30 g 0   No current  facility-administered medications for this visit.    Patient confirms/reports the following allergies:  No Known Allergies  No orders of the defined types were placed in this encounter.   AUTHORIZATION INFORMATION Primary Insurance: 1D#: Group #:  Secondary Insurance: 1D#: Group #:  SCHEDULE INFORMATION: Date: 07/27/23 Time: Location: [facility]

## 2023-06-21 NOTE — Assessment & Plan Note (Signed)
 Untreated for years due to mask intolerance.

## 2023-06-21 NOTE — Assessment & Plan Note (Signed)
 Well controlled on current regimen of amlodipine , losartan  and metoprolol  . Renal function stable, no changes today.   Lab Results  Component Value Date   CREATININE 0.95 06/21/2023   Lab Results  Component Value Date   NA 139 06/21/2023   K 4.3 06/21/2023   CL 104 06/21/2023   CO2 26 06/21/2023

## 2023-06-21 NOTE — Telephone Encounter (Signed)
 Spoke with pt per pt her insurance is in Network with [employer]. Pt has Community education officer.

## 2023-06-21 NOTE — Patient Instructions (Addendum)
 Your sons will remain children until they address and resolve their own problems  Your sons' Love should not be conditional upon you BAILING them out.  That's manipulation  CONSIDER GOING TO A SUPPORT GROUP LIKE [facility] FAMILY MEMBERS WHO ARE VICTIMS OF DRUG ADDICTION ([email or web address])

## 2023-06-21 NOTE — Assessment & Plan Note (Signed)
 Symptoms controlled on current therapy but she is working hard to resist the emotioanl manipulation  by her two drug abusing sons.  Recommending  [facility]

## 2023-06-21 NOTE — Assessment & Plan Note (Addendum)
 She has been under treatment  with PT by Kirsteins,  with no significant improvement .  MRI done in Nov.  Dec   OV reviewed:   MRI of the lumbar spine was performed  and showed no compressive lesions in the lumbar spine,  no foraminal stenosis of any significance.  There was moderate lumbar spondylosis and facet joints L5-S1 greater than L4-5.  No progressive lower extremity weakness no numbness tingling in the lower extremities. Patient remains functionally independent with all self-care and mobility she does not require an assistive device.

## 2023-06-25 NOTE — Progress Notes (Signed)
  PROCEDURE RECORD [facility]   Name: Melanie Fisher DOB:Apr 22, 1966 MRN: [id number]  Date:06/25/2023  Physician: Prentice Compton, MD    Nurse/CMA: Tye Kitty MA  Allergies: No Known Allergies  Consent Signed: Yes.    Is patient diabetic? No.    Pregnant: No. LMP: No LMP recorded. Patient has had a hysterectomy. (age [age])  Anticoagulants: no Anti-inflammatory: no Antibiotics: no  Procedure: Right L3-4-5 Medial Branch Block  Position: Prone Start Time: 2:23 pm  End Time: 2:30 pm  Fluoro Time: 29  RN/CMA Royal MA Zelda Reames MA    Time 1:51 PM  2:34 PM    BP 137/86 133/82    Pulse 56 54    Respirations 16 16    O2 Sat 99 98    S/S 6 6    Pain Level 8/10 5/10     D/C home with Husband, patient A & O X 3, D/C instructions reviewed, and sits independently.

## 2023-06-26 ENCOUNTER — Other Ambulatory Visit: Payer: Self-pay

## 2023-06-26 ENCOUNTER — Telehealth: Payer: Self-pay | Admitting: Family Medicine

## 2023-06-26 MED ORDER — ACYCLOVIR 400 MG PO TABS: ORAL_TABLET | ORAL | 1 refills | Status: DC

## 2023-06-26 NOTE — Telephone Encounter (Signed)
 This patient is scheduled with me to discuss her recent lab work done by her PCP.  She really should be seeing her PCP to discuss these labs.  I would suggest getting her set up with her PCP for follow-up on this.  I am fine seeing her if she would prefer to see me though this is a discussion best had with the physician that knows her the best.

## 2023-06-26 NOTE — Telephone Encounter (Signed)
 This has been corrected,

## 2023-06-27 ENCOUNTER — Encounter: Payer: Self-pay | Admitting: Internal Medicine

## 2023-06-27 ENCOUNTER — Telehealth: Payer: 59 | Admitting: Internal Medicine

## 2023-06-27 VITALS — Ht 63.0 in | Wt 203.0 lb

## 2023-06-27 DIAGNOSIS — E7800 Pure hypercholesterolemia, unspecified: Secondary | ICD-10-CM | POA: Diagnosis not present

## 2023-06-27 MED ORDER — ROSUVASTATIN CALCIUM 5 MG PO TABS: 5.0000 mg | ORAL_TABLET | Freq: Every day | ORAL | 0 refills | Status: DC

## 2023-06-27 NOTE — Assessment & Plan Note (Addendum)
 Patient's risk using the AHA cardiac risk calculator does not accurate reflect risk of CAD ,  given her FH of early CAD in 2 first degree family members. Risks and beneifits of statin therapy discussed and she has accepted recommendations to start low dose rosuvastatin  and rtc clinic in  weeks for cmet

## 2023-06-27 NOTE — Progress Notes (Signed)
 Virtual Visit via Caregility   Note   This format is felt to be most appropriate for this patient at this time.  All issues noted in this document were discussed and addressed.  No physical exam was performed (except for noted visual exam findings with Video Visits).   I connected with Melanie Fisher on 06/27/23 at  5:00 PM EST by a video enabled telemedicine application  and verified that I am speaking with the correct person using two identifiers. Location patient: home Location provider: work or home office Persons participating in the virtual visit: patient, provider  I discussed the limitations, risks, security and privacy concerns of performing an evaluation and management service by telephone and the availability of in person appointments. I also discussed with the patient that there may be a patient responsible charge related to this service. The patient expressed understanding and agreed to proceed.    Reason for visit: DISCUSS LAB RESU;TS  HPI:  Melanie Fisher is a 58 YR OLD FEMALE WITH COPD secondary to > 11 pack year HISTORY OF TOBACCO ABUSE (QUIT ONE YEAR AGO) and  hypertension ,who present s to discuss recent labs noting hyperlipidemiia ,with an  LDL of 140.  She has no prior trial of statins.  She has a strong FH of early CAD (father had CABG at 31,  sister had AMI at age 69).  She does not exercise regularly,    Lab Results  Component Value Date   CHOL 223 (H) 06/21/2023   HDL 65.50 06/21/2023   LDLCALC 140 (H) 06/21/2023   LDLDIRECT 143.0 06/21/2023   TRIG 83.0 06/21/2023   CHOLHDL 3 06/21/2023        ROS: See pertinent positives and negatives per HPI.  Past Medical History:  Diagnosis Date   Allergy    Anxiety    Asthma    Depression    GERD (gastroesophageal reflux disease)    Heart murmur    Hyperlipidemia    Hypertension    Sleep apnea    Tobacco abuse     Past Surgical History:  Procedure Laterality Date   ABDOMINAL HYSTERECTOMY  2008   partial    COLONOSCOPY WITH PROPOFOL  N/A 06/04/2018   Procedure: COLONOSCOPY WITH PROPOFOL ;  Surgeon: Therisa Bi, MD;  Location[facility] ENDOSCOPY;  Service: Gastroenterology;  Laterality: N/A;   right toe bunionectomy     right wrist ganglion cyst removal      Family History  Problem Relation Age of Onset   Kidney disease Mother 75       ESKD, partial neprhectomy   COPD Mother    Heart disease Father        CABG at age 40   Alcohol abuse Father        until late 6   Diabetes Father    Hypertension Father    Heart disease Sister 48       first MI at age 68   Diabetes Sister    Cancer Sister        uterine ca   Stroke Brother 82   Cancer Maternal Uncle 60       stage 4 colon CA   Colon cancer Maternal Uncle    Esophageal cancer Neg Hx    Pancreatic cancer Neg Hx    Rectal cancer Neg Hx    Stomach cancer Neg Hx    Breast cancer Neg Hx     SOCIAL HX:  reports that she quit smoking about 2 years ago. Her smoking use  included cigarettes. She started smoking about 23 years ago. She has a 10.5 pack-year smoking history. She has never used smokeless tobacco. She reports that she does not currently use alcohol. She reports that she does not use drugs.    Current Outpatient Medications:    acyclovir  (ZOVIRAX ) 400 MG tablet, TAKE 1 TABLET BY MOUTH EVERY DAY FOR PREVENTION OR OUTBREAK, Disp: 90 tablet, Rfl: 1   amLODipine  (NORVASC ) 10 MG tablet, TAKE 1 TABLET(10 MG) BY MOUTH DAILY, Disp: 90 tablet, Rfl: 1   diazepam  (VALIUM ) 5 MG tablet, Take 1 tablet (5 mg total) by mouth at bedtime as needed for anxiety., Disp: 30 tablet, Rfl: 1   losartan  (COZAAR ) 100 MG tablet, TAKE 1 TABLET(100 MG) BY MOUTH DAILY, Disp: 90 tablet, Rfl: 1   metoprolol  succinate (TOPROL -XL) 50 MG 24 hr tablet, TAKE 1 TABLET BY MOUTH EVERY DAY, Disp: 90 tablet, Rfl: 1   omeprazole  (PRILOSEC) 40 MG capsule, TAKE 1 CAPSULE(40 MG) BY MOUTH DAILY, Disp: 90 capsule, Rfl: 3   pregabalin  (LYRICA ) 100 MG capsule, Take 1 capsule (100 mg  total) by mouth 3 (three) times daily., Disp: 90 capsule, Rfl: 5   rosuvastatin  (CRESTOR ) 5 MG tablet, Take 1 tablet (5 mg total) by mouth daily., Disp: 90 tablet, Rfl: 0   sertraline  (ZOLOFT ) 100 MG tablet, Take 1 tablet (100 mg total) by mouth daily., Disp: 90 tablet, Rfl: 1   triamcinolone  cream (KENALOG ) 0.1 %, Apply 1 Application topically 2 (two) times daily., Disp: 30 g, Rfl: 0  EXAM:  VITALS per patient if applicable:  GENERAL: alert, oriented, appears well and in no acute distress  HEENT: atraumatic, conjunttiva clear, no obvious abnormalities on inspection of external nose and ears  NECK: normal movements of the head and neck  LUNGS: on inspection no signs of respiratory distress, breathing rate appears normal, no obvious gross SOB, gasping or wheezing  CV: no obvious cyanosis  MS: moves all visible extremities without noticeable abnormality  PSYCH/NEURO: pleasant and cooperative, no obvious depression or anxiety, speech and thought processing grossly intact  ASSESSMENT AND PLAN: Pure hypercholesterolemia Assessment & Plan: Patient's risk using the AHA cardiac risk calculator does not accurate reflect risk of CAD ,  given her FH of early CAD in 2 first degree family members. Risks and beneifits of statin therapy discussed and she has accepted recommendations to start low dose rosuvastatin  and rtc clinic in  weeks for cmet   Orders: -     Rosuvastatin  Calcium ; Take 1 tablet (5 mg total) by mouth daily.  Dispense: 90 tablet; Refill: 0 -     Comprehensive metabolic panel; Future      I discussed the assessment and treatment plan with the patient. The patient was provided an opportunity to ask questions and all were answered. The patient agreed with the plan and demonstrated an understanding of the instructions.   The patient was advised to call back or seek an in-person evaluation if the symptoms worsen or if the condition fails to improve as anticipated.   I spent 20  minutes dedicated to the care of this patient on the date of this face to face virtual encounter to include pre-visit review of her medical history,  Face-to-face time with the patient , and post visit ordering of testing and therapeutics.    Melanie Fisher Kettering, MD

## 2023-06-29 ENCOUNTER — Encounter: Payer: 59 | Attending: Physical Medicine & Rehabilitation | Admitting: Physical Medicine & Rehabilitation

## 2023-06-29 ENCOUNTER — Encounter: Payer: Self-pay | Admitting: Physical Medicine & Rehabilitation

## 2023-06-29 VITALS — BP 137/86 | HR 56 | Temp 97.6°F | Ht 63.0 in | Wt 203.0 lb

## 2023-06-29 DIAGNOSIS — M47817 Spondylosis without myelopathy or radiculopathy, lumbosacral region: Secondary | ICD-10-CM | POA: Diagnosis not present

## 2023-06-29 MED ORDER — LIDOCAINE HCL (PF) 2 % IJ SOLN
4.0000 mL | Freq: Once | INTRAMUSCULAR | Status: AC
  Administered 2023-06-29: 4 mL

## 2023-06-29 MED ORDER — IOHEXOL 180 MG/ML  SOLN
3.0000 mL | Freq: Once | INTRAMUSCULAR | Status: AC
Start: 2023-06-29 — End: 2023-06-29
  Administered 2023-06-29: 3 mL

## 2023-06-29 MED ORDER — LIDOCAINE HCL 1 % IJ SOLN
10.0000 mL | Freq: Once | INTRAMUSCULAR | Status: AC
  Administered 2023-06-29: 10 mL

## 2023-06-29 NOTE — Patient Instructions (Signed)
 Lumbar medial branch blocks were performed. This is to help diagnose the cause of the low back pain. It is important that you keep track of your pain for the first day or 2 after injection. This injection can give you temporary relief that lasts for hours or up to several months. There is no way to predict duration of pain relief.  Please try to compare your pain after injection to for the injection.  If this injection gives you  temporary relief there may be another longer-lasting procedure that may be beneficial call radiofrequency ablation   Please call for appt when pain worsens

## 2023-06-29 NOTE — Progress Notes (Signed)
Right lumbar L3, L4 medial branch blocks and L5 dorsal ramus injection under fluoroscopic guidance  Indication: Right Lumbar pain which is not relieved by medication management or other conservative care and interfering with self-care and mobility.  Informed consent was obtained after describing risks and benefits of the procedure with the patient, this includes bleeding, bruising, infection, paralysis and medication side effects. The patient wishes to proceed and has given written consent. The patient was placed in a prone position. The lumbar area was marked and prepped with Betadine. One ML of 1% lidocaine was injected into each of 3 areas into the skin and subcutaneous tissue. Then a 22-gauge 5in spinal needle was inserted targeting the junction of the Right S1 superior articular process and sacral ala junction. Needle was advanced under fluoroscopic guidance. Bone contact was made.Isovue 200 was injected x0.5 mL demonstrating no intravascular uptake. Then a solution containing 2% MPF lidocaine was injected x0.5 mL. Then the Right L5 superior articular process in transverse process junction was targeted. Bone contact was made.Isovue 200 was injected x0.5 mL demonstrating no intravascular uptake. Then a solution containing 2% MPF lidocaine was injected x0.5 mL. Then the Right L4 superior articular process in transverse process junction was targeted. Bone contact was made. Isovue 200 was injected x0.5 mL demonstrating no intravascular uptake. Then a solution containing2% MPF lidocaine was injected x0.5 mL Patient tolerated procedure well. Post procedure instructions were given. Please refer to post procedure form. 

## 2023-07-20 ENCOUNTER — Encounter: Payer: Self-pay | Admitting: Gastroenterology

## 2023-07-22 ENCOUNTER — Other Ambulatory Visit: Payer: Self-pay | Admitting: Internal Medicine

## 2023-07-23 ENCOUNTER — Encounter: Payer: Self-pay | Admitting: Internal Medicine

## 2023-07-23 ENCOUNTER — Other Ambulatory Visit (INDEPENDENT_AMBULATORY_CARE_PROVIDER_SITE_OTHER): Payer: 59

## 2023-07-23 DIAGNOSIS — E7800 Pure hypercholesterolemia, unspecified: Secondary | ICD-10-CM

## 2023-07-23 LAB — COMPREHENSIVE METABOLIC PANEL
ALT: 9 U/L (ref 0–35)
AST: 14 U/L (ref 0–37)
Albumin: 4.2 g/dL (ref 3.5–5.2)
Alkaline Phosphatase: 68 U/L (ref 39–117)
BUN: 23 mg/dL (ref 6–23)
CO2: 28 meq/L (ref 19–32)
Calcium: 9.2 mg/dL (ref 8.4–10.5)
Chloride: 104 meq/L (ref 96–112)
Creatinine, Ser: 0.91 mg/dL (ref 0.40–1.20)
GFR: 69.32 mL/min (ref >60.00)
Glucose, Bld: 88 mg/dL (ref 70–99)
Potassium: 4.4 meq/L (ref 3.5–5.1)
Sodium: 140 meq/L (ref 135–145)
Total Bilirubin: 0.3 mg/dL (ref 0.2–1.2)
Total Protein: 7.4 g/dL (ref 6.0–8.3)

## 2023-07-24 ENCOUNTER — Encounter: Payer: Self-pay | Admitting: Internal Medicine

## 2023-07-27 ENCOUNTER — Encounter
Admission: RE | Disposition: A | Discharge status: Home / Self Care | Payer: Self-pay | Source: Home / Self Care | Attending: Gastroenterology

## 2023-07-27 ENCOUNTER — Ambulatory Visit: Payer: 59

## 2023-07-27 ENCOUNTER — Other Ambulatory Visit: Payer: Self-pay

## 2023-07-27 ENCOUNTER — Ambulatory Visit
Admission: RE | Admit: 2023-07-27 | Discharge: 2023-07-27 | Disposition: A | Discharge status: Home / Self Care | Payer: 59 | Attending: Gastroenterology | Admitting: Gastroenterology

## 2023-07-27 DIAGNOSIS — Z860100 Personal history of colon polyps, unspecified: Secondary | ICD-10-CM | POA: Diagnosis not present

## 2023-07-27 DIAGNOSIS — Z87891 Personal history of nicotine dependence: Secondary | ICD-10-CM | POA: Insufficient documentation

## 2023-07-27 DIAGNOSIS — F32A Depression, unspecified: Secondary | ICD-10-CM | POA: Insufficient documentation

## 2023-07-27 DIAGNOSIS — G4730 Sleep apnea, unspecified: Secondary | ICD-10-CM | POA: Diagnosis not present

## 2023-07-27 DIAGNOSIS — K219 Gastro-esophageal reflux disease without esophagitis: Secondary | ICD-10-CM | POA: Diagnosis not present

## 2023-07-27 DIAGNOSIS — I10 Essential (primary) hypertension: Secondary | ICD-10-CM | POA: Diagnosis not present

## 2023-07-27 DIAGNOSIS — Z79899 Other long term (current) drug therapy: Secondary | ICD-10-CM | POA: Insufficient documentation

## 2023-07-27 DIAGNOSIS — D126 Benign neoplasm of colon, unspecified: Secondary | ICD-10-CM | POA: Diagnosis present

## 2023-07-27 DIAGNOSIS — Z1211 Encounter for screening for malignant neoplasm of colon: Secondary | ICD-10-CM

## 2023-07-27 DIAGNOSIS — E785 Hyperlipidemia, unspecified: Secondary | ICD-10-CM | POA: Diagnosis not present

## 2023-07-27 DIAGNOSIS — Z800 Family history of malignant neoplasm of digestive organs: Secondary | ICD-10-CM | POA: Diagnosis not present

## 2023-07-27 DIAGNOSIS — F419 Anxiety disorder, unspecified: Secondary | ICD-10-CM | POA: Insufficient documentation

## 2023-07-27 DIAGNOSIS — D122 Benign neoplasm of ascending colon: Secondary | ICD-10-CM | POA: Diagnosis not present

## 2023-07-27 DIAGNOSIS — K635 Polyp of colon: Secondary | ICD-10-CM | POA: Diagnosis not present

## 2023-07-27 DIAGNOSIS — J45909 Unspecified asthma, uncomplicated: Secondary | ICD-10-CM | POA: Diagnosis not present

## 2023-07-27 HISTORY — PX: POLYPECTOMY: SHX5525

## 2023-07-27 HISTORY — PX: COLONOSCOPY WITH PROPOFOL: SHX5780

## 2023-07-27 SURGERY — COLONOSCOPY WITH PROPOFOL
Anesthesia: General

## 2023-07-27 MED ORDER — EPHEDRINE SULFATE-NACL 50-0.9 MG/10ML-% IV SOSY
PREFILLED_SYRINGE | INTRAVENOUS | Status: DC | PRN
  Administered 2023-07-27: 5 mg via INTRAVENOUS

## 2023-07-27 MED ORDER — PROPOFOL 10 MG/ML IV BOLUS
INTRAVENOUS | Status: AC
  Filled 2023-07-27: qty 20

## 2023-07-27 MED ORDER — PROPOFOL 500 MG/50ML IV EMUL
INTRAVENOUS | Status: DC | PRN
  Administered 2023-07-27: 150 ug/kg/min via INTRAVENOUS
  Administered 2023-07-27: 75 ug/kg/min via INTRAVENOUS
  Administered 2023-07-27: 80 mg via INTRAVENOUS

## 2023-07-27 MED ORDER — EPHEDRINE 5 MG/ML INJ
INTRAVENOUS | Status: AC
  Filled 2023-07-27: qty 5

## 2023-07-27 MED ORDER — STERILE WATER FOR IRRIGATION IR SOLN: Status: DC | PRN

## 2023-07-27 MED ORDER — PROPOFOL 500 MG/50ML IV EMUL: INTRAVENOUS | Status: DC | PRN

## 2023-07-27 MED ORDER — SODIUM CHLORIDE 0.9 % IV SOLN: INTRAVENOUS | Status: DC

## 2023-07-27 NOTE — Op Note (Signed)
 [facility] Gastroenterology Patient Name: Melanie Fisher Procedure Date: 07/27/2023 9:41 AM MRN: [id number] Account #: [id number] Date of Birth: 1966/06/14 Admit Type: Outpatient Age: 58 Room: [facility] ENDO ROOM 4 Gender: Female Note Status: Finalized Instrument Name: [id number] Procedure:             Colonoscopy Indications:           Surveillance: Personal history of colonic polyps                         (unknown histology) on last colonoscopy more than 5                         years ago Providers:             Ruel Kung MD, MD Referring MD:          Verneita Kettering, MD (Referring MD) Medicines:             Monitored Anesthesia Care Complications:         No immediate complications. Procedure:             Pre-Anesthesia Assessment:                        - Prior to the procedure, a History and Physical was                         performed, and patient medications, allergies and                         sensitivities were reviewed. The patient's tolerance                         of previous anesthesia was reviewed.                        - The risks and benefits of the procedure and the                         sedation options and risks were discussed with the                         patient. All questions were answered and informed                         consent was obtained.                        - ASA Grade Assessment: II - A patient with mild                         systemic disease.                        After obtaining informed consent, the colonoscope was                         passed under direct vision. Throughout the procedure,                         the patient's blood pressure,  pulse, and oxygen                         saturations were monitored continuously. The                         Colonoscope was introduced through the anus and                         advanced to the the cecum, identified by the                         appendiceal  orifice. The colonoscopy was performed                         with ease. The patient tolerated the procedure well.                         The quality of the bowel preparation was excellent.                         The ileocecal valve, appendiceal orifice, and rectum                         were photographed. Findings:      The perianal and digital rectal examinations were normal.      Two sessile polyps were found in the ascending colon. The polyps were 5       to 7 mm in size. These polyps were removed with a cold snare. Resection       and retrieval were complete.      The exam was otherwise without abnormality on direct and retroflexion       views. Impression:            - Two 5 to 7 mm polyps in the ascending colon, removed                         with a cold snare. Resected and retrieved.                        - The examination was otherwise normal on direct and                         retroflexion views. Recommendation:        - Discharge patient to home (with escort).                        - Resume previous diet.                        - Continue present medications.                        - Await pathology results.                        - Repeat colonoscopy in 5-10 years for surveillance                         based on pathology results. Procedure  Code(s):     --- Professional ---                        [phone], Colonoscopy, flexible; with removal of                         tumor(s), polyp(s), or other lesion(s) by snare                         technique Diagnosis Code(s):     --- Professional ---                        Z86.010, Personal history of colonic polyps                        D12.2, Benign neoplasm of ascending colon CPT copyright 2022 American Medical Association. All rights reserved. The codes documented in this report are preliminary and upon coder review may  be revised to meet current compliance requirements. Ruel Kung, MD Ruel Kung MD, MD 07/27/2023  10:05:47 AM This report has been signed electronically. Number of Addenda: 0 Note Initiated On: 07/27/2023 9:41 AM Scope Withdrawal Time: 0 hours 10 minutes 44 seconds  Total Procedure Duration: 0 hours 14 minutes 29 seconds  Estimated Blood Loss:  Estimated blood loss: none.      [facility]

## 2023-07-27 NOTE — Transfer of Care (Signed)
 Immediate Anesthesia Transfer of Care Note  Patient: Melanie Fisher  Procedure(s) Performed: COLONOSCOPY WITH PROPOFOL  POLYPECTOMY  Patient Location: PACU  Anesthesia Type:MAC  Level of Consciousness: awake  Airway & Oxygen Therapy: Patient Spontanous Breathing  Post-op Assessment: Report given to RN and Post -op Vital signs reviewed and stable  Post vital signs: Reviewed and stable  Last Vitals:  Vitals Value Taken Time  BP 99/60 07/27/23 1007  Temp 35.2 C 07/27/23 1007  Pulse 57 07/27/23 1009  Resp    SpO2 100 % 07/27/23 1009  Vitals shown include unfiled device data.  Last Pain:  Vitals:   07/27/23 1007  TempSrc: Temporal  PainSc:       Patients Stated Pain Goal: 0 (07/27/23 0924)  Complications: No notable events documented.

## 2023-07-27 NOTE — Anesthesia Preprocedure Evaluation (Signed)
 Anesthesia Evaluation  Patient identified by MRN, date of birth, ID band Patient awake    Reviewed: Allergy & Precautions, H&P , NPO status , Patient's Chart, lab work & pertinent test results, reviewed documented beta blocker date and time   Airway Mallampati: II   Neck ROM: full    Dental  (+) Poor Dentition   Pulmonary asthma , sleep apnea , former smoker   Pulmonary exam normal breath sounds clear to auscultation       Cardiovascular Exercise Tolerance: Good hypertension, On Medications negative cardio ROS Normal cardiovascular exam+ Valvular Problems/Murmurs  Rhythm:regular Rate:Normal     Neuro/Psych  PSYCHIATRIC DISORDERS Anxiety Depression     Neuromuscular disease negative neurological ROS  negative psych ROS   GI/Hepatic negative GI ROS, Neg liver ROS,GERD  Medicated,,  Endo/Other  negative endocrine ROS    Renal/GU      Musculoskeletal   Abdominal   Peds  Hematology negative hematology ROS (+)   Anesthesia Other Findings Past Medical History: No date: Allergy No date: Anxiety No date: Asthma No date: Depression No date: GERD (gastroesophageal reflux disease) No date: Heart murmur No date: Hyperlipidemia No date: Hypertension No date: Sleep apnea No date: Tobacco abuse Past Surgical History: 2008: ABDOMINAL HYSTERECTOMY     Comment:  partial No date: right toe bunionectomy No date: right wrist ganglion cyst removal BMI    Body Mass Index:  35.07 kg/m     Reproductive/Obstetrics negative OB ROS                             Anesthesia Physical Anesthesia Plan  ASA: 3  Anesthesia Plan: General   Post-op Pain Management: Minimal or no pain anticipated   Induction: Intravenous  PONV Risk Score and Plan: 3 and Propofol  infusion, TIVA and Ondansetron   Airway Management Planned: Nasal Cannula  Additional Equipment: None  Intra-op Plan:   Post-operative Plan:    Informed Consent: I have reviewed the patients History and Physical, chart, labs and discussed the procedure including the risks, benefits and alternatives for the proposed anesthesia with the patient or authorized representative who has indicated his/her understanding and acceptance.     Dental Advisory Given  Plan Discussed with: CRNA  Anesthesia Plan Comments: (Discussed risks of anesthesia with patient, including possibility of difficulty with spontaneous ventilation under anesthesia necessitating airway intervention, PONV, and rare risks such as cardiac or respiratory or neurological events, and allergic reactions. Discussed the role of CRNA in patient's perioperative care. Patient understands.)        Anesthesia Quick Evaluation

## 2023-07-27 NOTE — Anesthesia Postprocedure Evaluation (Signed)
 Anesthesia Post Note  Patient: Melanie Fisher  Procedure(s) Performed: COLONOSCOPY WITH PROPOFOL  POLYPECTOMY  Patient location during evaluation: Endoscopy Anesthesia Type: General Level of consciousness: awake and alert Pain management: pain level controlled Vital Signs Assessment: post-procedure vital signs reviewed and stable Respiratory status: spontaneous breathing, nonlabored ventilation, respiratory function stable and patient connected to nasal cannula oxygen Cardiovascular status: blood pressure returned to baseline and stable Postop Assessment: no apparent nausea or vomiting Anesthetic complications: no  No notable events documented.   Last Vitals:  Vitals:   07/27/23 1025 07/27/23 1027  BP: 129/85 126/81  Pulse: (!) 59 (!) 59  Resp:    Temp:    SpO2: 100% 100%    Last Pain:  Vitals:   07/27/23 1027  TempSrc:   PainSc: 0-No pain                 Debby Mines

## 2023-07-27 NOTE — H&P (Signed)
 Ruel Kung, MD [address], [address], [place], [place], [address] [address], [address], [place], [place], [address] Phone: [phone]  Fax: [phone]  Primary Care Physician:  Marylynn Verneita CROME, MD   Pre-Procedure History & Physical: HPI:  CYMONE YESKE is a 58 y.o. female is here for an colonoscopy.   Past Medical History:  Diagnosis Date   Allergy    Anxiety    Asthma    Depression    GERD (gastroesophageal reflux disease)    Heart murmur    Hyperlipidemia    Hypertension    Sleep apnea    Tobacco abuse     Past Surgical History:  Procedure Laterality Date   ABDOMINAL HYSTERECTOMY  2008   partial   COLONOSCOPY WITH PROPOFOL  N/A 06/04/2018   Procedure: COLONOSCOPY WITH PROPOFOL ;  Surgeon: Kung Ruel, MD;  Location: [facility] ENDOSCOPY;  Service: Gastroenterology;  Laterality: N/A;   right toe bunionectomy     right wrist ganglion cyst removal      Prior to Admission medications   Medication Sig Start Date End Date Taking? Authorizing Provider  acyclovir  (ZOVIRAX ) 400 MG tablet TAKE 1 TABLET BY MOUTH EVERY DAY FOR PREVENTION OR OUTBREAK 06/26/23   Marylynn Verneita CROME, MD  amLODipine  (NORVASC ) 10 MG tablet TAKE 1 TABLET(10 MG) BY MOUTH DAILY 04/17/23   Marylynn Verneita CROME, MD  diazepam  (VALIUM ) 5 MG tablet Take 1 tablet (5 mg total) by mouth at bedtime as needed for anxiety. 09/11/22   Marylynn Verneita CROME, MD  losartan  (COZAAR ) 100 MG tablet TAKE 1 TABLET(100 MG) BY MOUTH DAILY 04/23/23   Marylynn Verneita CROME, MD  metoprolol  succinate (TOPROL -XL) 50 MG 24 hr tablet TAKE 1 TABLET BY MOUTH EVERY DAY 07/23/23   Marylynn Verneita CROME, MD  omeprazole  (PRILOSEC) 40 MG capsule TAKE 1 CAPSULE(40 MG) BY MOUTH DAILY 01/26/23   Marylynn Verneita CROME, MD  pregabalin  (LYRICA ) 100 MG capsule Take 1 capsule (100 mg total) by mouth 3 (three) times daily. 06/15/23   Kirsteins, Prentice BRAVO, MD  rosuvastatin  (CRESTOR ) 5 MG tablet Take 1 tablet (5 mg total) by mouth daily. 06/27/23   Marylynn Verneita CROME, MD  sertraline  (ZOLOFT )  100 MG tablet Take 1 tablet (100 mg total) by mouth daily. 06/21/23   Marylynn Verneita CROME, MD  triamcinolone  cream (KENALOG ) 0.1 % Apply 1 Application topically 2 (two) times daily. 04/15/23   Cleaster Tinnie LABOR, PA-C    Allergies as of 06/21/2023   (No Known Allergies)    Family History  Problem Relation Age of Onset   Kidney disease Mother 52       ESKD, partial neprhectomy   COPD Mother    Heart disease Father        CABG at age 83   Alcohol abuse Father        until late 90   Diabetes Father    Hypertension Father    Heart disease Sister 58       first MI at age 28   Diabetes Sister    Cancer Sister        uterine ca   Stroke Brother 8   Cancer Maternal Uncle 60       stage 4 colon CA   Colon cancer Maternal Uncle    Esophageal cancer Neg Hx    Pancreatic cancer Neg Hx    Rectal cancer Neg Hx    Stomach cancer Neg Hx    Breast cancer Neg Hx  Social History   Socioeconomic History   Marital status: Married    Spouse name: Not on file   Number of children: Not on file   Years of education: Not on file   Highest education level: Not on file  Occupational History   Occupation: [employer]  Tobacco Use   Smoking status: Former    Current packs/day: 0.00    Average packs/day: 0.5 packs/day for 21.0 years (10.5 ttl pk-yrs)    Types: Cigarettes    Start date: 08/1999    Quit date: 08/2020    Years since quitting: 2.9   Smokeless tobacco: Never  Vaping Use   Vaping status: Never Used  Substance and Sexual Activity   Alcohol use: Not Currently    Comment: quit drinking    Drug use: No   Sexual activity: Not Currently  Other Topics Concern   Not on file  Social History Narrative   Married as of 05/30/20   Former smoker quit 07/29/19 cigarettes    Social Drivers of Health   Financial Resource Strain: Unknown (01/28/2021)   Overall Financial Resource Strain (CARDIA)    Difficulty of Paying Living Expenses: Patient declined  Food Insecurity: Unknown (01/28/2021)    Hunger Vital Sign    Worried About Running Out of Food in the Last Year: Patient declined    Ran Out of Food in the Last Year: Patient declined  Transportation Needs: Unknown (01/28/2021)   PRAPARE - Transportation    Lack of Transportation (Medical): Patient declined    Lack of Transportation (Non-Medical): Patient declined  Physical Activity: Sufficiently Active (01/28/2021)   Exercise Vital Sign    Days of Exercise per Week: 3 days    Minutes of Exercise per Session: 50 min  Stress: Unknown (01/28/2021)   [employer] of Occupational Health - Occupational Stress Questionnaire    Feeling of Stress : Patient declined  Social Connections: Socially Integrated (01/28/2021)   Social Connection and Isolation Panel [NHANES]    Frequency of Communication with Friends and Family: More than three times a week    Frequency of Social Gatherings with Friends and Family: Three times a week    Attends Religious Services: 1 to 4 times per year    Active Member of Clubs or Organizations: Yes    Attends Banker Meetings: Patient declined    Marital Status: Married  Catering Manager Violence: Not on file    Review of Systems: See HPI, otherwise negative ROS  Physical Exam: There were no vitals taken for this visit. General:   Alert,  pleasant and cooperative in NAD Head:  Normocephalic and atraumatic. Neck:  Supple; no masses or thyromegaly. Lungs:  Clear throughout to auscultation, normal respiratory effort.    Heart:  +S1, +S2, Regular rate and rhythm, No edema. Abdomen:  Soft, nontender and nondistended. Normal bowel sounds, without guarding, and without rebound.   Neurologic:  Alert and  oriented x4;  grossly normal neurologically.  Impression/Plan: SHAMELL HITTLE is here for an colonoscopy to be performed for surveillance due to prior history of colon polyps   Risks, benefits, limitations, and alternatives regarding  colonoscopy have been reviewed with the patient.   Questions have been answered.  All parties agreeable.   Ruel Kung, MD  07/27/2023, 9:00 AM

## 2023-07-30 ENCOUNTER — Encounter: Payer: Self-pay | Admitting: Gastroenterology

## 2023-07-30 LAB — SURGICAL PATHOLOGY

## 2023-08-01 ENCOUNTER — Encounter: Payer: Self-pay | Admitting: Gastroenterology

## 2023-08-03 ENCOUNTER — Ambulatory Visit: Payer: 59 | Admitting: Physical Medicine & Rehabilitation

## 2023-08-24 ENCOUNTER — Encounter: Payer: Self-pay | Admitting: Physical Medicine & Rehabilitation

## 2023-08-24 ENCOUNTER — Encounter: Payer: 59 | Attending: Physical Medicine & Rehabilitation | Admitting: Physical Medicine & Rehabilitation

## 2023-08-24 VITALS — BP 123/75 | HR 54

## 2023-08-24 DIAGNOSIS — G8929 Other chronic pain: Secondary | ICD-10-CM | POA: Insufficient documentation

## 2023-08-24 DIAGNOSIS — M4317 Spondylolisthesis, lumbosacral region: Secondary | ICD-10-CM | POA: Diagnosis not present

## 2023-08-24 DIAGNOSIS — M5416 Radiculopathy, lumbar region: Secondary | ICD-10-CM | POA: Insufficient documentation

## 2023-08-24 DIAGNOSIS — M5441 Lumbago with sciatica, right side: Secondary | ICD-10-CM | POA: Diagnosis not present

## 2023-08-24 NOTE — Progress Notes (Deleted)
 Subjective:    Patient ID: Melanie Fisher, female    DOB: 1965-12-05, 58 y.o.   MRN: [id number]  HPI   Pain Inventory Average Pain {NUMBERS; 0-10:5044} Pain Right Now {NUMBERS; 0-10:5044} My pain is {PAIN DESCRIPTION:21022940}  LOCATION OF PAIN  ***  BOWEL Number of stools per week: *** Oral laxative use {YES/NO:21197} Type of laxative *** Enema or suppository use {YES/NO:21197} History of colostomy {YES/NO:21197} Incontinent {YES/NO:21197}  BLADDER {bladder options:24190} In and out cath, frequency *** Able to self cath {YES/NO:21197} Bladder incontinence {YES/NO:21197} Frequent urination {YES/NO:21197} Leakage with coughing {YES/NO:21197} Difficulty starting stream {YES/NO:21197} Incomplete bladder emptying {YES/NO:21197}   Mobility {MOBILITY ION:62952841}  Function {FUNCTION:21022946}  Neuro/Psych {NEURO/PSYCH:21022948}  Prior Studies {CPRM PRIOR STUDIES:21022953}  Physicians involved in your care {CPRM PHYSICIANS INVOLVED IN YOUR CARE:21022954}   Family History  Problem Relation Age of Onset   Kidney disease Mother 41       ESKD, partial neprhectomy   COPD Mother    Heart disease Father        CABG at age 25   Alcohol abuse Father        until late 81   Diabetes Father    Hypertension Father    Heart disease Sister 50       first MI at age 61   Diabetes Sister    Cancer Sister        uterine ca   Stroke Brother 86   Cancer Maternal Uncle 60       stage 4 colon CA   Colon cancer Maternal Uncle    Esophageal cancer Neg Hx    Pancreatic cancer Neg Hx    Rectal cancer Neg Hx    Stomach cancer Neg Hx    Breast cancer Neg Hx    Social History   Socioeconomic History   Marital status: Married    Spouse name: Not on file   Number of children: Not on file   Years of education: Not on file   Highest education level: Not on file  Occupational History   Occupation: [employer]  Tobacco Use   Smoking status: Former    Current packs/day:  0.00    Average packs/day: 0.5 packs/day for 21.0 years (10.5 ttl pk-yrs)    Types: Cigarettes    Start date: 08/1999    Quit date: 08/2020    Years since quitting: 3.0   Smokeless tobacco: Never  Vaping Use   Vaping status: Never Used  Substance and Sexual Activity   Alcohol use: Not Currently    Comment: quit drinking    Drug use: No   Sexual activity: Not Currently  Other Topics Concern   Not on file  Social History Narrative   Married as of 05/30/20   Former smoker quit 07/29/19 cigarettes    Social Drivers of Health   Financial Resource Strain: Unknown (01/28/2021)   Overall Financial Resource Strain (CARDIA)    Difficulty of Paying Living Expenses: Patient declined  Food Insecurity: Unknown (01/28/2021)   Hunger Vital Sign    Worried About Running Out of Food in the Last Year: Patient declined    Ran Out of Food in the Last Year: Patient declined  Transportation Needs: Unknown (01/28/2021)   PRAPARE - Transportation    Lack of Transportation (Medical): Patient declined    Lack of Transportation (Non-Medical): Patient declined  Physical Activity: Sufficiently Active (01/28/2021)   Exercise Vital Sign    Days of Exercise per Week: 3 days  Minutes of Exercise per Session: 50 min  Stress: Unknown (01/28/2021)   [employer] of Occupational Health - Occupational Stress Questionnaire    Feeling of Stress : Patient declined  Social Connections: Socially Integrated (01/28/2021)   Social Connection and Isolation Panel [NHANES]    Frequency of Communication with Friends and Family: More than three times a week    Frequency of Social Gatherings with Friends and Family: Three times a week    Attends Religious Services: 1 to 4 times per year    Active Member of Clubs or Organizations: Yes    Attends Banker Meetings: Patient declined    Marital Status: Married   Past Surgical History:  Procedure Laterality Date   ABDOMINAL HYSTERECTOMY  2008   partial    COLONOSCOPY WITH PROPOFOL N/A 06/04/2018   Procedure: COLONOSCOPY WITH PROPOFOL;  Surgeon: Wyline Mood, MD;  Location: [facility] ENDOSCOPY;  Service: Gastroenterology;  Laterality: N/A;   COLONOSCOPY WITH PROPOFOL N/A 07/27/2023   Procedure: COLONOSCOPY WITH PROPOFOL;  Surgeon: Wyline Mood, MD;  Location: [facility] ENDOSCOPY;  Service: Gastroenterology;  Laterality: N/A;   POLYPECTOMY  07/27/2023   Procedure: POLYPECTOMY;  Surgeon: Wyline Mood, MD;  Location: [facility] ENDOSCOPY;  Service: Gastroenterology;;   right toe bunionectomy     right wrist ganglion cyst removal     Past Medical History:  Diagnosis Date   Allergy    Anxiety    Asthma    Depression    GERD (gastroesophageal reflux disease)    Heart murmur    Hyperlipidemia    Hypertension    Sleep apnea    Tobacco abuse    There were no vitals taken for this visit.  Opioid Risk Score:   Fall Risk Score:  `1  Depression screen [facility] 2/9     06/21/2023   11:34 AM 05/25/2023    2:05 PM 04/20/2023    3:25 PM 03/22/2023    2:49 PM 03/16/2023   11:19 AM 02/09/2023    1:48 PM 01/05/2023   10:40 AM  Depression screen [facility] 2/9  Decreased Interest 0 0 0 0 0 1 0  Down, Depressed, Hopeless 2 0 0 0 0 1 0  PHQ - 2 Score 2 0 0 0 0 2 0  Altered sleeping 0      0  Tired, decreased energy 0      0  Change in appetite 2      0  Feeling bad or failure about yourself  1      0  Trouble concentrating 0      0  Moving slowly or fidgety/restless 0      0  Suicidal thoughts 0      0  PHQ-9 Score 5      0  Difficult doing work/chores Not difficult at all          Review of Systems     Objective:   Physical Exam        Assessment & Plan:

## 2023-08-24 NOTE — Progress Notes (Signed)
 Subjective:    Patient ID: Melanie Fisher, female    DOB: 03-02-1966, 58 y.o.   MRN: [id number]  HPI 58 year old female with history of chronic low back pain.  She has had recent MRI lumbar spine in November 2024 demonstrating lumbar spondylosis there was a grade 1 spondylolisthesis due to degenerative changes in the facet joints at L5-S1.  The patient has some right lower extremity pain when standing or walking.  Denies any numbness or weakness in the lower extremity Takes tylenol 2 tabs 3 times per day  06/29/2023  Right lumbar L3, L4 medial branch blocks and L5 dorsal ramus injection under fluoroscopic guidance    Pre injection 8/10 Post injection 5/10  MRI LUMBAR SPINE WITHOUT CONTRAST   TECHNIQUE: Multiplanar, multisequence MR imaging of the lumbar spine was performed. No intravenous contrast was administered.   COMPARISON:  Lumbar x-ray 01/12/2023   FINDINGS: Segmentation:  Standard.   Alignment: Minimal grade 1 anterolisthesis of L5 on S1 secondary to facet disease.   Vertebrae: No acute fracture, evidence of discitis, or aggressive bone lesion.   Conus medullaris and cauda equina: Conus extends to the T12-L1 level. Conus and cauda equina appear normal.   Paraspinal and other soft tissues: No acute paraspinal abnormality.   Disc levels:   Disc spaces: Minimal disc height loss at L1-2.   T12-L1: Mild broad-based disc bulge. Mild bilateral facet arthropathy. No foraminal or central canal stenosis.   L1-L2: Mild broad-based disc bulge. Mild bilateral facet arthropathy. No foraminal or central canal stenosis.   L2-L3: Mild disc bulge. Mild bilateral facet arthropathy. Mild central canal stenosis. No foraminal stenosis.   L3-L4: Mild disc bulge. Mild bilateral facet arthropathy. No foraminal stenosis. Mild central canal stenosis.   L4-L5: Minimal disc bulge. Mild bilateral facet arthropathy. Mild spinal stenosis and bilateral lateral recess stenosis.  Mild bilateral foraminal stenosis.   L5-S1: Mild disc bulge. Moderate bilateral facet arthropathy. No foraminal or central canal stenosis.   IMPRESSION: 1. Mild lumbar spine spondylosis as described above. 2. No acute osseous injury of the lumbar spine.     Electronically Signed   By: Elige Ko M.D.   On: 05/23/2023 13:53       Pain Inventory Average Pain 9 Pain Right Now 8 My pain is constant, sharp, and burning  In the last 24 hours, has pain interfered with the following? General activity 0 Relation with others 0 Enjoyment of life 0 What TIME of day is your pain at its worst? morning , daytime, evening, and night Sleep (in general) Fair  Pain is worse with: walking, bending, sitting, standing, and some activites Pain improves with: medication Relief from Meds: 8  Family History  Problem Relation Age of Onset   Kidney disease Mother 52       ESKD, partial neprhectomy   COPD Mother    Heart disease Father        CABG at age 57   Alcohol abuse Father        until late 34   Diabetes Father    Hypertension Father    Heart disease Sister 14       first MI at age 69   Diabetes Sister    Cancer Sister        uterine ca   Stroke Brother 61   Cancer Maternal Uncle 60       stage 4 colon CA   Colon cancer Maternal Uncle    Esophageal cancer Neg Hx  Pancreatic cancer Neg Hx    Rectal cancer Neg Hx    Stomach cancer Neg Hx    Breast cancer Neg Hx    Social History   Socioeconomic History   Marital status: Married    Spouse name: Not on file   Number of children: Not on file   Years of education: Not on file   Highest education level: Not on file  Occupational History   Occupation: [employer]  Tobacco Use   Smoking status: Former    Current packs/day: 0.00    Average packs/day: 0.5 packs/day for 21.0 years (10.5 ttl pk-yrs)    Types: Cigarettes    Start date: 08/1999    Quit date: 08/2020    Years since quitting: 3.0   Smokeless tobacco: Never   Vaping Use   Vaping status: Never Used  Substance and Sexual Activity   Alcohol use: Not Currently    Comment: quit drinking    Drug use: No   Sexual activity: Not Currently  Other Topics Concern   Not on file  Social History Narrative   Married as of 05/30/20   Former smoker quit 07/29/19 cigarettes    Social Drivers of Health   Financial Resource Strain: Unknown (01/28/2021)   Overall Financial Resource Strain (CARDIA)    Difficulty of Paying Living Expenses: Patient declined  Food Insecurity: Unknown (01/28/2021)   Hunger Vital Sign    Worried About Running Out of Food in the Last Year: Patient declined    Ran Out of Food in the Last Year: Patient declined  Transportation Needs: Unknown (01/28/2021)   PRAPARE - Transportation    Lack of Transportation (Medical): Patient declined    Lack of Transportation (Non-Medical): Patient declined  Physical Activity: Sufficiently Active (01/28/2021)   Exercise Vital Sign    Days of Exercise per Week: 3 days    Minutes of Exercise per Session: 50 min  Stress: Unknown (01/28/2021)   [employer] of Occupational Health - Occupational Stress Questionnaire    Feeling of Stress : Patient declined  Social Connections: Socially Integrated (01/28/2021)   Social Connection and Isolation Panel [NHANES]    Frequency of Communication with Friends and Family: More than three times a week    Frequency of Social Gatherings with Friends and Family: Three times a week    Attends Religious Services: 1 to 4 times per year    Active Member of Clubs or Organizations: Yes    Attends Banker Meetings: Patient declined    Marital Status: Married   Past Surgical History:  Procedure Laterality Date   ABDOMINAL HYSTERECTOMY  2008   partial   COLONOSCOPY WITH PROPOFOL N/A 06/04/2018   Procedure: COLONOSCOPY WITH PROPOFOL;  Surgeon: Wyline Mood, MD;  Location: [facility] ENDOSCOPY;  Service: Gastroenterology;  Laterality: N/A;   COLONOSCOPY WITH  PROPOFOL N/A 07/27/2023   Procedure: COLONOSCOPY WITH PROPOFOL;  Surgeon: Wyline Mood, MD;  Location: [facility] ENDOSCOPY;  Service: Gastroenterology;  Laterality: N/A;   POLYPECTOMY  07/27/2023   Procedure: POLYPECTOMY;  Surgeon: Wyline Mood, MD;  Location: [facility] ENDOSCOPY;  Service: Gastroenterology;;   right toe bunionectomy     right wrist ganglion cyst removal     Past Surgical History:  Procedure Laterality Date   ABDOMINAL HYSTERECTOMY  2008   partial   COLONOSCOPY WITH PROPOFOL N/A 06/04/2018   Procedure: COLONOSCOPY WITH PROPOFOL;  Surgeon: Wyline Mood, MD;  Location: [facility] ENDOSCOPY;  Service: Gastroenterology;  Laterality: N/A;   COLONOSCOPY WITH PROPOFOL N/A 07/27/2023  Procedure: COLONOSCOPY WITH PROPOFOL;  Surgeon: Wyline Mood, MD;  Location: [facility] ENDOSCOPY;  Service: Gastroenterology;  Laterality: N/A;   POLYPECTOMY  07/27/2023   Procedure: POLYPECTOMY;  Surgeon: Wyline Mood, MD;  Location: [facility] ENDOSCOPY;  Service: Gastroenterology;;   right toe bunionectomy     right wrist ganglion cyst removal     Past Medical History:  Diagnosis Date   Allergy    Anxiety    Asthma    Depression    GERD (gastroesophageal reflux disease)    Heart murmur    Hyperlipidemia    Hypertension    Sleep apnea    Tobacco abuse    There were no vitals taken for this visit.  Opioid Risk Score:   Fall Risk Score:  `1  Depression screen [facility] 2/9     06/21/2023   11:34 AM 05/25/2023    2:05 PM 04/20/2023    3:25 PM 03/22/2023    2:49 PM 03/16/2023   11:19 AM 02/09/2023    1:48 PM 01/05/2023   10:40 AM  Depression screen PHQ 2/9  Decreased Interest 0 0 0 0 0 1 0  Down, Depressed, Hopeless 2 0 0 0 0 1 0  PHQ - 2 Score 2 0 0 0 0 2 0  Altered sleeping 0      0  Tired, decreased energy 0      0  Change in appetite 2      0  Feeling bad or failure about yourself  1      0  Trouble concentrating 0      0  Moving slowly or fidgety/restless 0      0  Suicidal thoughts 0      0  PHQ-9 Score 5      0   Difficult doing work/chores Not difficult at all          Review of Systems  Musculoskeletal:  Positive for back pain.       Pain across back & down right leg  All other systems reviewed and are negative.      Objective:   Physical Exam Negative straight leg raising Sensation intact light touch in lower extremities 5/5 strength bilateral hip flexor knee extensor ankle dorsiflexion No pain with hip internal/external rotation  Sacral thrust (prone) : Positive Lateral compression: Negative FABER's: Negative Distraction (supine): Negative Thigh thrust test: Negative       Assessment & Plan:  #1.  Chronic low back pain MRI predominant findings consistent with lumbar facet arthropathy but she did not respond this expected to lumbar medial branch blocks approximately 40% relief She does have some pain with bending forward as well as with standing she does have some right lower extremity pain I suspect there may be some dynamic spondylolisthesis causing some right L5 nerve root irritation.  Will schedule for right L5-S1 transforaminal lumbar epidural steroid injections has been going on for greater than 4 months.  He has tried physical therapy prior to MRI Right L5-S1 ESI transforaminal

## 2023-08-30 ENCOUNTER — Telehealth: Payer: Self-pay | Admitting: *Deleted

## 2023-08-30 MED ORDER — METHOCARBAMOL 500 MG PO TABS: 500.0000 mg | ORAL_TABLET | Freq: Three times a day (TID) | ORAL | 1 refills | Status: DC | PRN

## 2023-08-30 NOTE — Telephone Encounter (Signed)
 Mrs Friedt called and reports she is having bad muscle spasms and does not have a muscle relaxer. She is asking if Dr Wynn Banker would send her in something for this issue.

## 2023-09-06 NOTE — Progress Notes (Deleted)
  PROCEDURE RECORD [facility]   Name: Melanie Fisher DOB:[birth year] MRN: [id number]  Date:09/06/2023  Physician: Claudette Laws, MD    Nurse/CMA: Charise Carwin MA  Allergies: No Known Allergies  Consent Signed: {yes [id number]}  Is patient diabetic? {yes no:314532}  CBG today? ***  Pregnant: {yes no:314532} LMP: No LMP recorded. Patient has had a hysterectomy. (age [age])  Anticoagulants: {Yes/No:19989} Anti-inflammatory: {Yes/No:19989} Antibiotics: {Yes/No:19989}  Procedure: RIght L5-S1 ESI Transforaminal   Position: Prone Start Time: ***  End Time: ***  Fluoro Time: ***  RN/CMA      Time      BP      Pulse      Respirations      O2 Sat      S/S      Pain Level       D/C home with ***, patient A & O X 3, D/C instructions reviewed, and sits independently.

## 2023-09-14 ENCOUNTER — Encounter: Payer: Self-pay | Admitting: Physical Medicine & Rehabilitation

## 2023-09-14 ENCOUNTER — Encounter: Admitting: Physical Medicine & Rehabilitation

## 2023-09-14 VITALS — BP 127/79 | HR 55 | Ht 63.0 in | Wt 208.0 lb

## 2023-09-14 DIAGNOSIS — G8929 Other chronic pain: Secondary | ICD-10-CM | POA: Diagnosis not present

## 2023-09-14 DIAGNOSIS — M4317 Spondylolisthesis, lumbosacral region: Secondary | ICD-10-CM | POA: Diagnosis not present

## 2023-09-14 DIAGNOSIS — M5441 Lumbago with sciatica, right side: Secondary | ICD-10-CM | POA: Diagnosis not present

## 2023-09-14 DIAGNOSIS — M5416 Radiculopathy, lumbar region: Secondary | ICD-10-CM | POA: Diagnosis not present

## 2023-09-14 MED ORDER — PREGABALIN 150 MG PO CAPS: 150.0000 mg | ORAL_CAPSULE | Freq: Three times a day (TID) | ORAL | 1 refills | Status: DC

## 2023-09-14 MED ORDER — PREGABALIN 100 MG PO CAPS
100.0000 mg | ORAL_CAPSULE | Freq: Three times a day (TID) | ORAL | 5 refills | Status: DC
Start: 2023-09-14 — End: 2023-09-14

## 2023-09-14 NOTE — Progress Notes (Signed)
 Subjective:    Patient ID: Melanie Fisher, female    DOB: 18-Mar-1966, 58 y.o.   MRN: [id number]  HPI  Low back pain with  RLE constant pain, the patient has numbness and tingling of the right foot.  She does not note any weakness in the legs.  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 The patient is on Lyrica 100 mg 3 times daily which she does not feel as effective in relieving her lower extremity pain.  She has no history of diabetes but has been told that she is "prediabetic"    MRI LUMBAR SPINE WITHOUT CONTRAST   TECHNIQUE: Multiplanar, multisequence MR imaging of the lumbar spine was performed. No intravenous contrast was administered.   COMPARISON:  Lumbar x-ray 01/12/2023   FINDINGS: Segmentation:  Standard.   Alignment: Minimal grade 1 anterolisthesis of L5 on S1 secondary to facet disease.   Vertebrae: No acute fracture, evidence of discitis, or aggressive bone lesion.   Conus medullaris and cauda equina: Conus extends to the T12-L1 level. Conus and cauda equina appear normal.   Paraspinal and other soft tissues: No acute paraspinal abnormality.   Disc levels:   Disc spaces: Minimal disc height loss at L1-2.   T12-L1: Mild broad-based disc bulge. Mild bilateral facet arthropathy. No foraminal or central canal stenosis.   L1-L2: Mild broad-based disc bulge. Mild bilateral facet arthropathy. No foraminal or central canal stenosis.   L2-L3: Mild disc bulge. Mild bilateral facet arthropathy. Mild central canal stenosis. No foraminal stenosis.   L3-L4: Mild disc bulge. Mild bilateral facet arthropathy. No foraminal stenosis. Mild central canal stenosis.   L4-L5: Minimal disc bulge. Mild bilateral facet arthropathy. Mild spinal stenosis and bilateral lateral recess stenosis. Mild bilateral foraminal stenosis.   L5-S1: Mild disc bulge. Moderate bilateral facet  arthropathy. No foraminal or central canal stenosis.   IMPRESSION: 1. Mild lumbar spine spondylosis as described above. 2. No acute osseous injury of the lumbar spine.     Electronically Signed   By: Elige Ko M.D.   On: 05/23/2023 13:53   Pain Inventory Average Pain 8 Pain Right Now 9 My pain is intermittent, constant, sharp, burning, dull, stabbing, tingling, and aching  In the last 24 hours, has pain interfered with the following? General activity 8 Relation with others 7 Enjoyment of life 7 What TIME of day is your pain at its worst? morning , daytime, evening, and night Sleep (in general) Fair  Pain is worse with: walking, bending, sitting, inactivity, standing, and some activites Pain improves with:  nothing Relief from Meds: 0  Family History  Problem Relation Age of Onset   Kidney disease Mother 1       ESKD, partial neprhectomy   COPD Mother    Heart disease Father        CABG at age 66   Alcohol abuse Father        until late 14   Diabetes Father    Hypertension Father    Heart disease Sister 48       first MI at age 32   Diabetes Sister    Cancer Sister        uterine ca   Stroke Brother 49   Cancer Maternal Uncle 60       stage 4 colon CA   Colon cancer Maternal Uncle    Esophageal cancer Neg  Hx    Pancreatic cancer Neg Hx    Rectal cancer Neg Hx    Stomach cancer Neg Hx    Breast cancer Neg Hx    Social History   Socioeconomic History   Marital status: Married    Spouse name: Not on file   Number of children: Not on file   Years of education: Not on file   Highest education level: Not on file  Occupational History   Occupation: [employer]  Tobacco Use   Smoking status: Former    Current packs/day: 0.00    Average packs/day: 0.5 packs/day for 21.0 years (10.5 ttl pk-yrs)    Types: Cigarettes    Start date: 08/1999    Quit date: 08/2020    Years since quitting: 3.0   Smokeless tobacco: Never  Vaping Use   Vaping status: Never  Used  Substance and Sexual Activity   Alcohol use: Not Currently    Comment: quit drinking    Drug use: No   Sexual activity: Not Currently  Other Topics Concern   Not on file  Social History Narrative   Married as of 05/30/20   Former smoker quit 07/29/19 cigarettes    Social Drivers of Health   Financial Resource Strain: Unknown (01/28/2021)   Overall Financial Resource Strain (CARDIA)    Difficulty of Paying Living Expenses: Patient declined  Food Insecurity: Unknown (01/28/2021)   Hunger Vital Sign    Worried About Running Out of Food in the Last Year: Patient declined    Ran Out of Food in the Last Year: Patient declined  Transportation Needs: Unknown (01/28/2021)   PRAPARE - Transportation    Lack of Transportation (Medical): Patient declined    Lack of Transportation (Non-Medical): Patient declined  Physical Activity: Sufficiently Active (01/28/2021)   Exercise Vital Sign    Days of Exercise per Week: 3 days    Minutes of Exercise per Session: 50 min  Stress: Unknown (01/28/2021)   [employer] of Occupational Health - Occupational Stress Questionnaire    Feeling of Stress : Patient declined  Social Connections: Socially Integrated (01/28/2021)   Social Connection and Isolation Panel [NHANES]    Frequency of Communication with Friends and Family: More than three times a week    Frequency of Social Gatherings with Friends and Family: Three times a week    Attends Religious Services: 1 to 4 times per year    Active Member of Clubs or Organizations: Yes    Attends Banker Meetings: Patient declined    Marital Status: Married   Past Surgical History:  Procedure Laterality Date   ABDOMINAL HYSTERECTOMY  2008   partial   COLONOSCOPY WITH PROPOFOL N/A 06/04/2018   Procedure: COLONOSCOPY WITH PROPOFOL;  Surgeon: Wyline Mood, MD;  Location: [facility] ENDOSCOPY;  Service: Gastroenterology;  Laterality: N/A;   COLONOSCOPY WITH PROPOFOL N/A 07/27/2023   Procedure:  COLONOSCOPY WITH PROPOFOL;  Surgeon: Wyline Mood, MD;  Location: [facility] ENDOSCOPY;  Service: Gastroenterology;  Laterality: N/A;   POLYPECTOMY  07/27/2023   Procedure: POLYPECTOMY;  Surgeon: Wyline Mood, MD;  Location: [facility] ENDOSCOPY;  Service: Gastroenterology;;   right toe bunionectomy     right wrist ganglion cyst removal     Past Surgical History:  Procedure Laterality Date   ABDOMINAL HYSTERECTOMY  2008   partial   COLONOSCOPY WITH PROPOFOL N/A 06/04/2018   Procedure: COLONOSCOPY WITH PROPOFOL;  Surgeon: Wyline Mood, MD;  Location: [facility] ENDOSCOPY;  Service: Gastroenterology;  Laterality: N/A;   COLONOSCOPY WITH  PROPOFOL N/A 07/27/2023   Procedure: COLONOSCOPY WITH PROPOFOL;  Surgeon: Wyline Mood, MD;  Location: [facility] ENDOSCOPY;  Service: Gastroenterology;  Laterality: N/A;   POLYPECTOMY  07/27/2023   Procedure: POLYPECTOMY;  Surgeon: Wyline Mood, MD;  Location: [facility] ENDOSCOPY;  Service: Gastroenterology;;   right toe bunionectomy     right wrist ganglion cyst removal     Past Medical History:  Diagnosis Date   Allergy    Anxiety    Asthma    Depression    GERD (gastroesophageal reflux disease)    Heart murmur    Hyperlipidemia    Hypertension    Sleep apnea    Tobacco abuse    Wt 208 lb (94.3 kg)   BMI 36.85 kg/m   Opioid Risk Score:   Fall Risk Score:  `1  Depression screen [facility] 2/9     08/24/2023    2:11 PM 06/21/2023   11:34 AM 05/25/2023    2:05 PM 04/20/2023    3:25 PM 03/22/2023    2:49 PM 03/16/2023   11:19 AM 02/09/2023    1:48 PM  Depression screen PHQ 2/9  Decreased Interest 1 0 0 0 0 0 1  Down, Depressed, Hopeless 1 2 0 0 0 0 1  PHQ - 2 Score 2 2 0 0 0 0 2  Altered sleeping  0       Tired, decreased energy  0       Change in appetite  2       Feeling bad or failure about yourself   1       Trouble concentrating  0       Moving slowly or fidgety/restless  0       Suicidal thoughts  0       PHQ-9 Score  5       Difficult doing work/chores  Not difficult at all          Review of Systems  Musculoskeletal:  Positive for back pain.       Right leg pain  All other systems reviewed and are negative.      Objective:   Physical Exam  General no acute distress Mood and affect appropriate Ambulates without assistive device forward flexed posture no evidence of toe drag or knee instability Lower extremity strength is 5/5 bilateral hip flexor knee extensor ankle dorsiflexor right EHL is 4/5 left EHL is 5/5 Sensation is reduced to pinprick in the right L3-L4-L5 distribution Lumbar spine range of motion 50% flexion extension lateral bending and rotation. There is pain in the lumbar paraspinal area Deep tendon reflexes are 3+ bilateral knees and 2+ at the ankles     Assessment & Plan:   1.  Chronic low back pain radiating to right lower extremity.  Her MRI demonstrates moderate L5-S1 facet arthropathy, as discussed with the patient her MRI does not show any compressive lesions in a supine position.  I suspect based on some segmental instability L5-S1 the patient is getting some right L5 nerve root compression in the standing position.  She does have mild EHL weakness on examination, sensory deficits involved L3-4-5 dermatomes.  Will schedule for EMG/NCV to evaluate for electrodiagnostic signs of radiculopathy.  I still recommend L5-S1 transforaminal lumbar epidural steroid injection fluoroscopic guidance with contrast enhancement.

## 2023-09-14 NOTE — Patient Instructions (Signed)
Electromyoneurogram Electromyoneurogram is a test to check how well your muscles and nerves are working. This procedure includes the combined use of electromyogram (EMG) and nerve conduction study (NCS). EMG is used to evaluate muscles and the nerves that control those muscles. NCS, which is also called electroneurogram, measures how well your nerves conduct electricity. The procedures should be done together to check if your muscles and nerves are healthy. If the results of the tests are abnormal, this may indicate disease or injury, such as a neuromuscular disease or peripheral nerve damage. Tell a health care provider about: Any allergies you have. All medicines you are taking, including vitamins, herbs, eye drops, creams, and over-the-counter medicines. Any bleeding problems you have. Any surgeries you have had. Any medical conditions you have. What are the risks? Generally, this is a safe procedure. However, problems may occur, including: Bleeding or bruising. Infection where the electrodes were inserted. What happens before the test? Medicines Take all of your usually prescribed medications before this testing is performed. Do not stop your blood thinners unless advised by your prescribing physician. General instructions Your health care provider may ask you to warm the limb that will be checked with warm water, hot pack, or wrapping the limb in a blanket. Do not use lotions or creams on the same day that you will be having the procedure. What happens during the test? For EMG  Your health care provider will ask you to stay in a position so that the muscle being studied can be accessed. You will be sitting or lying down. You may be given a medicine to numb the area (local anesthetic) and the skin will be disinfected. A very thin needle that has an electrode will be inserted into your muscle, one muscle at a time. Typically, multiple muscles are evaluated during a single study. Another  small electrode will be placed on your skin near the muscle. Your health care provider will ask you to continue to remain still. The electrodes will record the electrical activity of your muscles. You may see this on a monitor or hear it in the room. After your muscles have been studied at rest, your health care provider will ask you to contract or flex your muscles. The electrodes will record the electrical activity of your muscles. Your health care provider will remove the electrodes and the electrode needle when the procedure is finished. The procedure may vary among health care providers and hospitals. For NCS  An electrode that records your nerve activity (recording electrode) will be placed on your skin by the muscle that is being studied. An electrode that is used as a reference (reference electrode) will be placed near the recording electrode. A paste or gel will be applied to your skin between the recording electrode and the reference electrode. Your nerve will be stimulated with a mild shock. The speed of the nerves and strength of response is recorded by the electrodes. Your health care provider will remove the electrodes and the gel when the procedure is finished. The procedure may vary among health care providers and hospitals. What can I expect after the test? It is up to you to get your test results. Ask your health care provider, or the department that is doing the test, when your results will be ready. Your health care provider may: Give you medicines for any pain. Monitor the insertion sites to make sure that bleeding stops. You should be able to drive yourself to and from the test. Discomfort can  persist for a few hours after the test, but should be better the next day. Contact a health care provider if: You have swelling, redness, or drainage at any of the insertion sites. Summary Electromyoneurogram is a test to check how well your muscles and nerves are working. If the  results of the tests are abnormal, this may indicate disease or injury. This is a safe procedure. However, problems may occur, such as bleeding and infection. Your health care provider will do two tests to complete this procedure. One checks your muscles (EMG) and another checks your nerves (NCS). It is up to you to get your test results. Ask your health care provider, or the department that is doing the test, when your results will be ready. This information is not intended to replace advice given to you by your health care provider. Make sure you discuss any questions you have with your health care provider. Document Revised: 02/16/2021 Document Reviewed: 01/16/2021 Elsevier Patient Education  2024 [employer].

## 2023-09-20 ENCOUNTER — Other Ambulatory Visit: Payer: Self-pay | Admitting: Internal Medicine

## 2023-09-20 DIAGNOSIS — E7800 Pure hypercholesterolemia, unspecified: Secondary | ICD-10-CM

## 2023-09-24 ENCOUNTER — Other Ambulatory Visit: Payer: Self-pay | Admitting: Internal Medicine

## 2023-09-24 DIAGNOSIS — E7800 Pure hypercholesterolemia, unspecified: Secondary | ICD-10-CM

## 2023-09-24 NOTE — Telephone Encounter (Signed)
 Copied from CRM [id number]. Topic: Clinical - Medication Refill >> Sep 24, 2023  9:40 AM Eunice Blase wrote: Most Recent Primary Care Visit:  Provider: [facility]  Department: [facility]  Visit Type: LAB VISIT  Date: 07/23/2023  Medication: rosuvastatin (CRESTOR) 5 MG tablet, sertraline (ZOLOFT) 100 MG tablet   Has the patient contacted their pharmacy? Yes (Agent: If no, request that the patient contact the pharmacy for the refill. If patient does not wish to contact the pharmacy document the reason why and proceed with request.) (Agent: If yes, when and what did the pharmacy advise?)Pharmacy need PCP approval and script  Is this the correct pharmacy for this prescription? Yes If no, delete pharmacy and type the correct one.  This is the patient's preferred pharmacy:  [id number] - [place],  - [address] AT [facility] [address] [place] [address] [place] [address] Phone: [phone] Fax: [phone]   Has the prescription been filled recently? Yes  Is the patient out of the medication? Yes  Has the patient been seen for an appointment in the last year OR does the patient have an upcoming appointment? Yes  Can we respond through MyChart? Yes  Agent: Please be advised that Rx refills may take up to 3 business days. We ask that you follow-up with your pharmacy.

## 2023-09-25 MED ORDER — SERTRALINE HCL 100 MG PO TABS: 100.0000 mg | ORAL_TABLET | Freq: Every day | ORAL | 1 refills | Status: DC

## 2023-09-25 MED ORDER — ROSUVASTATIN CALCIUM 5 MG PO TABS: 5.0000 mg | ORAL_TABLET | Freq: Every day | ORAL | 0 refills | Status: DC

## 2023-10-18 ENCOUNTER — Other Ambulatory Visit: Payer: Self-pay | Admitting: Internal Medicine

## 2023-10-26 ENCOUNTER — Encounter: Payer: Self-pay | Admitting: Physical Medicine & Rehabilitation

## 2023-10-26 ENCOUNTER — Encounter: Attending: Physical Medicine & Rehabilitation | Admitting: Physical Medicine & Rehabilitation

## 2023-10-26 VITALS — BP 115/74 | HR 59 | Ht 63.0 in | Wt 215.0 lb

## 2023-10-26 DIAGNOSIS — M79604 Pain in right leg: Secondary | ICD-10-CM | POA: Diagnosis not present

## 2023-10-26 DIAGNOSIS — G969 Disorder of central nervous system, unspecified: Secondary | ICD-10-CM | POA: Diagnosis not present

## 2023-10-26 DIAGNOSIS — G250 Essential tremor: Secondary | ICD-10-CM | POA: Insufficient documentation

## 2023-10-26 DIAGNOSIS — R251 Tremor, unspecified: Secondary | ICD-10-CM

## 2023-10-26 MED ORDER — PREGABALIN 150 MG PO CAPS: 150.0000 mg | ORAL_CAPSULE | Freq: Three times a day (TID) | ORAL | 1 refills | Status: AC

## 2023-10-26 MED ORDER — METHOCARBAMOL 500 MG PO TABS: 500.0000 mg | ORAL_TABLET | Freq: Three times a day (TID) | ORAL | 1 refills | Status: DC | PRN

## 2023-10-26 NOTE — Progress Notes (Signed)
 Patient with 1 year history of low back and right lower extremity pain.  Has had recent lumbar MRI on 05/02/2023 showing no compressive lesions and mild to moderate facet arthrosis at L4-5 L5-S1. She is here for EMG/NCV of the lower extremities.  Right lower extremity nerve conduction studies were normal at the right peroneal motor.  The distal tibial motor was normal as well proximal not elicited but likely due to body habitus. The sural nerve was normal. EMG needle study of the right anterior tibialis posterior tibialis medial gastroc vastus medialis and short head biceps femoris were all normal.  Patient had reduced tolerance of needle study. A tremor was noted approximately 3 Hz frequency when examining the right tibialis posterior muscle. Made referral to neurology to evaluate. Please see EMG report for full details, scanned under media tab.

## 2023-10-26 NOTE — Addendum Note (Signed)
 Addended by: Genetta Kenning on: 10/26/2023 07:06 PM   Modules accepted: Orders

## 2023-10-26 NOTE — Addendum Note (Signed)
 Addended by: Genetta Kenning on: 10/26/2023 04:15 PM   Modules accepted: Orders

## 2023-10-29 ENCOUNTER — Encounter: Payer: Self-pay | Admitting: Neurology

## 2023-11-21 ENCOUNTER — Other Ambulatory Visit: Payer: Self-pay | Admitting: Internal Medicine

## 2023-11-21 DIAGNOSIS — Z1231 Encounter for screening mammogram for malignant neoplasm of breast: Secondary | ICD-10-CM

## 2023-12-03 ENCOUNTER — Ambulatory Visit: Admitting: Neurology

## 2023-12-03 ENCOUNTER — Encounter: Payer: Self-pay | Admitting: Neurology

## 2023-12-03 VITALS — BP 123/77 | HR 58 | Ht 63.0 in | Wt 214.0 lb

## 2023-12-03 DIAGNOSIS — G250 Essential tremor: Secondary | ICD-10-CM | POA: Diagnosis not present

## 2023-12-03 NOTE — Progress Notes (Signed)
 [facility] Clinic Note - Initial Visit   Date: 12/03/2023   Melanie Fisher MRN: [id number] DOB: September 01, 1965   Dear Dr. Sharl Davies:  Thank you for your kind referral of Melanie Fisher for consultation of foot tremor. Although her history is well known to you, please allow us  to reiterate it for the purpose of our medical record. The patient was accompanied to the clinic by husband who also provides collateral information.     Melanie Fisher is a 58 y.o. right-handed female with hypertension, hyperlipidemia, chronic low back pain, depression, and asthma presenting for evaluation of tremor.   IMPRESSION/PLAN: Benign essential tremor with side-to-side head tremor.  I do not observe any hand or foot tremor on her exam today.  She had a family history of tremor.  She does not have any parkinsonian features.  I discussed that we use medications when the tremor is interfering with her quality of life.  Currently, she is asymptomatic, so would like to continue to monitor.    Return to clinic, if symptoms get worse  ------------------------------------------------------------- History of present illness: She is referred for neurological evaluation after EMG showed 3Hz  tremor in the right posterior tibialis muscle upon needle insertion. Needle EMG of the remaining muscles were normal.  She denies noticing any tremor of the feet and hands, but has been aware of a head tremor for the past few years.  Two of her sisters also have tremors.  She denies weakness, imbalance, or falls.  She has chronic low back pain and sees PM&R for this.  She takes gabapentin  and tried ESI with no significant benefit.   Out-side paper records, electronic medical record, and images have been reviewed where available and summarized as:  MRI lumbar spine wo contrast 05/02/2023: IMPRESSION: 1. Mild lumbar spine spondylosis as described above. 2. No acute osseous injury of the lumbar spine.     Lab Results  Component Value Date   HGBA1C 6.1 06/21/2023   Lab Results  Component Value Date   VITAMINB12 346 02/21/2019   Lab Results  Component Value Date   TSH 4.49 10/12/2022   Lab Results  Component Value Date   ESRSEDRATE 20 11/18/2013    Past Medical History:  Diagnosis Date   Allergy    Anxiety    Asthma    Depression    GERD (gastroesophageal reflux disease)    Heart murmur    Hyperlipidemia    Hypertension    Sleep apnea    Tobacco abuse     Past Surgical History:  Procedure Laterality Date   ABDOMINAL HYSTERECTOMY  2008   partial   COLONOSCOPY WITH PROPOFOL  N/A 06/04/2018   Procedure: COLONOSCOPY WITH PROPOFOL ;  Surgeon: Luke Salaam, MD;  Location: [facility] ENDOSCOPY;  Service: Gastroenterology;  Laterality: N/A;   COLONOSCOPY WITH PROPOFOL  N/A 07/27/2023   Procedure: COLONOSCOPY WITH PROPOFOL ;  Surgeon: Luke Salaam, MD;  Location: [facility] ENDOSCOPY;  Service: Gastroenterology;  Laterality: N/A;   POLYPECTOMY  07/27/2023   Procedure: POLYPECTOMY;  Surgeon: Luke Salaam, MD;  Location: [facility] ENDOSCOPY;  Service: Gastroenterology;;   right toe bunionectomy     right wrist ganglion cyst removal       Medications:  Outpatient Encounter Medications as of 12/03/2023  Medication Sig   acyclovir  (ZOVIRAX ) 400 MG tablet TAKE 1 TABLET BY MOUTH EVERY DAY FOR PREVENTION OR OUTBREAK   amLODipine  (NORVASC ) 10 MG tablet TAKE 1 TABLET(10 MG) BY MOUTH DAILY   diazepam  (VALIUM ) 5 MG tablet Take  1 tablet (5 mg total) by mouth at bedtime as needed for anxiety.   losartan  (COZAAR ) 100 MG tablet TAKE 1 TABLET(100 MG) BY MOUTH DAILY   methocarbamol  (ROBAXIN ) 500 MG tablet Take 1 tablet (500 mg total) by mouth every 8 (eight) hours as needed for muscle spasms.   metoprolol  succinate (TOPROL -XL) 50 MG 24 hr tablet TAKE 1 TABLET BY MOUTH EVERY DAY   omeprazole  (PRILOSEC) 40 MG capsule TAKE 1 CAPSULE(40 MG) BY MOUTH DAILY   pregabalin  (LYRICA ) 150 MG capsule Take 1 capsule (150 mg  total) by mouth 3 (three) times daily.   rosuvastatin  (CRESTOR ) 5 MG tablet Take 1 tablet (5 mg total) by mouth daily.   sertraline  (ZOLOFT ) 100 MG tablet Take 1 tablet (100 mg total) by mouth daily.   rosuvastatin  (CRESTOR ) 5 MG tablet TAKE 1 TABLET(5 MG) BY MOUTH DAILY (Patient not taking: Reported on 12/03/2023)   No facility-administered encounter medications on file as of 12/03/2023.    Allergies: No Known Allergies  Family History: Family History  Problem Relation Age of Onset   Kidney disease Mother 59       ESKD, partial neprhectomy   COPD Mother    Heart disease Father        CABG at age 4   Alcohol abuse Father        until late 71   Diabetes Father    Hypertension Father    Heart disease Sister 52       first MI at age 71   Diabetes Sister    Tremor Sister    Tremor Sister    Cancer Sister        uterine ca   Stroke Brother 91   Cancer Maternal Uncle 60       stage 4 colon CA   Colon cancer Maternal Uncle    Esophageal cancer Neg Hx    Pancreatic cancer Neg Hx    Rectal cancer Neg Hx    Stomach cancer Neg Hx    Breast cancer Neg Hx     Social History: Social History   Tobacco Use   Smoking status: Former    Current packs/day: 0.00    Average packs/day: 0.5 packs/day for 21.0 years (10.5 ttl pk-yrs)    Types: Cigarettes    Start date: 08/1999    Quit date: 08/2020    Years since quitting: 3.2   Smokeless tobacco: Never  Vaping Use   Vaping status: Every Day   Substances: Nicotine , Flavoring  Substance Use Topics   Alcohol use: Not Currently    Comment: quit drinking    Drug use: No   Social History   Social History Narrative   Married as of 05/30/20   Former smoker quit 07/29/19 cigarettes       Are you right handed or left handed? Right Handed   Are you currently employed ? Yes   What is your current occupation?   Do you live at home alone? No    Who lives with you? With husband    What type of home do you live in: 1 story or 2 story?  Lives in a one story home         Vital Signs:  BP 123/77   Pulse (!) 58   Ht 5' 3 (1.6 m)   Wt 214 lb (97.1 kg)   SpO2 97%   BMI 37.91 kg/m     Neurological Exam: MENTAL STATUS including orientation to time, place, person,  recent and remote memory, attention span and concentration, language, and fund of knowledge is normal.  Speech is not dysarthric.  CRANIAL NERVES: II:  No visual field defects.     III-IV-VI: Pupils equal round and reactive to light.  Normal conjugate, extra-ocular eye movements in all directions of gaze.  No nystagmus.  No ptosis.   V:  Normal facial sensation.    VII:  Normal facial symmetry and movements.   VIII:  Normal hearing and vestibular function.   IX-X:  Normal palatal movement.   XI:  Normal shoulder shrug and head rotation.   XII:  Normal tongue strength and range of motion, no deviation or fasciculation.  MOTOR:  Mild side-to-side head tremor at rest.  There is no hand or feet tremor at rest or with activity. No atrophy or fasciculations.  No pronator drift.  Motor strength is 5/5 throughout.  Tone is normal.   MSRs:                                           Right        Left brachioradialis 2+  2+  biceps 2+  2+  triceps 2+  2+  patellar 2+  2+  ankle jerk 2+  2+  Hoffman no  no  plantar response down  down   SENSORY:  Normal and symmetric perception of light touch, temperature and vibration.  COORDINATION/GAIT: Normal finger-to- nose-finger.  Intact rapid alternating movements bilaterally.   Gait mildly wide-based and stable, unassisted.  Unsteady with tandem gait but able to perform.  Stressed gait intact.    Thank you for allowing me to participate in patient's care.  If I can answer any additional questions, I would be pleased to do so.    Sincerely,    Carollyn Etcheverry K. Lydia Sams, DO

## 2023-12-05 DIAGNOSIS — H353131 Nonexudative age-related macular degeneration, bilateral, early dry stage: Secondary | ICD-10-CM | POA: Diagnosis not present

## 2023-12-06 ENCOUNTER — Ambulatory Visit
Admission: RE | Admit: 2023-12-06 | Discharge: 2023-12-06 | Disposition: A | Discharge status: Home / Self Care | Source: Ambulatory Visit | Attending: Internal Medicine | Admitting: Internal Medicine

## 2023-12-06 DIAGNOSIS — Z1231 Encounter for screening mammogram for malignant neoplasm of breast: Secondary | ICD-10-CM | POA: Diagnosis not present

## 2023-12-11 ENCOUNTER — Telehealth: Payer: Self-pay | Admitting: Internal Medicine

## 2023-12-11 DIAGNOSIS — E7800 Pure hypercholesterolemia, unspecified: Secondary | ICD-10-CM

## 2023-12-11 DIAGNOSIS — R7303 Prediabetes: Secondary | ICD-10-CM

## 2023-12-11 DIAGNOSIS — I10 Essential (primary) hypertension: Secondary | ICD-10-CM

## 2023-12-11 NOTE — Telephone Encounter (Signed)
 Patient need labs ordered

## 2023-12-13 ENCOUNTER — Other Ambulatory Visit

## 2023-12-19 ENCOUNTER — Ambulatory Visit: Payer: 59 | Admitting: Internal Medicine

## 2023-12-19 ENCOUNTER — Encounter: Payer: Self-pay | Admitting: Internal Medicine

## 2023-12-19 VITALS — BP 126/66 | HR 65 | Ht 63.0 in | Wt 211.0 lb

## 2023-12-19 DIAGNOSIS — R7303 Prediabetes: Secondary | ICD-10-CM | POA: Diagnosis not present

## 2023-12-19 DIAGNOSIS — G250 Essential tremor: Secondary | ICD-10-CM | POA: Diagnosis not present

## 2023-12-19 DIAGNOSIS — J0120 Acute ethmoidal sinusitis, unspecified: Secondary | ICD-10-CM

## 2023-12-19 DIAGNOSIS — E7800 Pure hypercholesterolemia, unspecified: Secondary | ICD-10-CM | POA: Diagnosis not present

## 2023-12-19 DIAGNOSIS — K219 Gastro-esophageal reflux disease without esophagitis: Secondary | ICD-10-CM

## 2023-12-19 DIAGNOSIS — J301 Allergic rhinitis due to pollen: Secondary | ICD-10-CM | POA: Diagnosis not present

## 2023-12-19 DIAGNOSIS — M4317 Spondylolisthesis, lumbosacral region: Secondary | ICD-10-CM | POA: Diagnosis not present

## 2023-12-19 DIAGNOSIS — J439 Emphysema, unspecified: Secondary | ICD-10-CM | POA: Diagnosis not present

## 2023-12-19 MED ORDER — PREDNISONE 10 MG PO TABS: ORAL_TABLET | ORAL | 0 refills | Status: DC

## 2023-12-19 MED ORDER — ACYCLOVIR 400 MG PO TABS: ORAL_TABLET | ORAL | 1 refills | Status: DC

## 2023-12-19 MED ORDER — OMEPRAZOLE 40 MG PO CPDR: DELAYED_RELEASE_CAPSULE | ORAL | 3 refills | Status: DC

## 2023-12-19 MED ORDER — ROSUVASTATIN CALCIUM 5 MG PO TABS: 5.0000 mg | ORAL_TABLET | Freq: Every day | ORAL | 1 refills | Status: DC

## 2023-12-19 MED ORDER — AMOXICILLIN-POT CLAVULANATE 875-125 MG PO TABS: 1.0000 | ORAL_TABLET | Freq: Two times a day (BID) | ORAL | 0 refills | Status: DC

## 2023-12-19 MED ORDER — METOPROLOL SUCCINATE ER 50 MG PO TB24: 50.0000 mg | ORAL_TABLET | Freq: Every day | ORAL | 1 refills | Status: DC

## 2023-12-19 NOTE — Progress Notes (Signed)
 Subjective:  Patient ID: Melanie Fisher, female    DOB: [birth year]  Age: 58 y.o. MRN: [id number]  CC: The primary encounter diagnosis was Acute non-recurrent ethmoidal sinusitis. Diagnoses of GERD without esophagitis, Pure hypercholesterolemia, Prediabetes, Pulmonary emphysema, unspecified emphysema type (HCC), Allergic rhinitis due to grass pollen, Benign essential tremor, and Spondylolisthesis of lumbosacral region were also pertinent to this visit.   HPI TAMBI THOLE presents for  Chief Complaint  Patient presents with   Medical Management of Chronic Issues    6 month follow up    6 day history of Sinus pressure/congestion, recurrent productive sounding cough aggravated by supine position,  headache pain between the eyes. Symptoms have not improved despite use of decongestants,  and  headache is aggravated by bending over.  Tried tylenol  sinus which transient improvement  of pain   Chronic back pain: lumbar spondylosis managed  thus far by physiatrist Kirsteins. Had one steroid injection in right  SI joint earlier in the year,  did not help.  Prednisone  helps transiently and  PT aggravated her pain.  She is taking lyrica  150 mg tid with minimal benefit.  Has tingling of legs bilaterally  to calves but denies leg weakness.  ESI offered for pinched nerve  but denied by insurance  .  Has had EMG /[place] studies.  Referred to neurologist for tremors of face (hereditary) and offered medication which she deferred   Outpatient Medications Prior to Visit  Medication Sig Dispense Refill   amLODipine  (NORVASC ) 10 MG tablet TAKE 1 TABLET(10 MG) BY MOUTH DAILY 90 tablet 1   diazepam  (VALIUM ) 5 MG tablet Take 1 tablet (5 mg total) by mouth at bedtime as needed for anxiety. 30 tablet 1   losartan  (COZAAR ) 100 MG tablet TAKE 1 TABLET(100 MG) BY MOUTH DAILY 90 tablet 1   methocarbamol  (ROBAXIN ) 500 MG tablet Take 1 tablet (500 mg total) by mouth every 8 (eight) hours as needed for muscle spasms. 45  tablet 1   pregabalin  (LYRICA ) 150 MG capsule Take 1 capsule (150 mg total) by mouth 3 (three) times daily. 90 capsule 1   sertraline  (ZOLOFT ) 100 MG tablet Take 1 tablet (100 mg total) by mouth daily. 90 tablet 1   acyclovir  (ZOVIRAX ) 400 MG tablet TAKE 1 TABLET BY MOUTH EVERY DAY FOR PREVENTION OR OUTBREAK 90 tablet 1   metoprolol  succinate (TOPROL -XL) 50 MG 24 hr tablet TAKE 1 TABLET BY MOUTH EVERY DAY 90 tablet 1   omeprazole  (PRILOSEC) 40 MG capsule TAKE 1 CAPSULE(40 MG) BY MOUTH DAILY 90 capsule 3   rosuvastatin  (CRESTOR ) 5 MG tablet Take 1 tablet (5 mg total) by mouth daily. 90 tablet 0   rosuvastatin  (CRESTOR ) 5 MG tablet TAKE 1 TABLET(5 MG) BY MOUTH DAILY (Patient not taking: Reported on 12/03/2023) 90 tablet 0   No facility-administered medications prior to visit.    Review of Systems;  Patient denies headache, fevers, malaise, unintentional weight loss, skin rash, eye pain, sinus congestion and sinus pain, sore throat, dysphagia,  hemoptysis , cough, dyspnea, wheezing, chest pain, palpitations, orthopnea, edema, abdominal pain, nausea, melena, diarrhea, constipation, flank pain, dysuria, hematuria, urinary  Frequency, nocturia, numbness, tingling, seizures,  Focal weakness, Loss of consciousness,  Tremor, insomnia, depression, anxiety, and suicidal ideation.      Objective:  BP 126/66   Pulse 65   Ht 5' 3 (1.6 m)   Wt 211 lb (95.7 kg)   SpO2 96%   BMI 37.38 kg/m   BP Readings from  Last 3 Encounters:  12/19/23 126/66  12/03/23 123/77  10/26/23 115/74    Wt Readings from Last 3 Encounters:  12/19/23 211 lb (95.7 kg)  12/03/23 214 lb (97.1 kg)  10/26/23 215 lb (97.5 kg)    Physical Exam Vitals reviewed.  Constitutional:      General: She is not in acute distress.    Appearance: Normal appearance. She is normal weight. She is not ill-appearing, toxic-appearing or diaphoretic.  HENT:     Head: Normocephalic.     Right Ear: Tympanic membrane normal.     Left Ear:  Tympanic membrane normal.     Nose: Congestion and rhinorrhea present.     Mouth/Throat:     Pharynx: No oropharyngeal exudate or posterior oropharyngeal erythema.  Eyes:     General: No scleral icterus.       Right eye: No discharge.        Left eye: No discharge.     Conjunctiva/sclera: Conjunctivae normal.  Cardiovascular:     Rate and Rhythm: Normal rate and regular rhythm.  Musculoskeletal:        General: Normal range of motion.     Cervical back: Neck supple.  Lymphadenopathy:     Cervical: No cervical adenopathy.  Skin:    General: Skin is warm and dry.  Neurological:     General: No focal deficit present.     Mental Status: She is alert and oriented to person, place, and time. Mental status is at baseline.  Psychiatric:        Mood and Affect: Mood normal.        Behavior: Behavior normal.        Thought Content: Thought content normal.        Judgment: Judgment normal.     Lab Results  Component Value Date   HGBA1C 6.1 12/19/2023   HGBA1C 6.1 06/21/2023   HGBA1C 5.8 10/12/2022    Lab Results  Component Value Date   CREATININE 1.15 12/19/2023   CREATININE 0.91 07/23/2023   CREATININE 0.95 06/21/2023    Lab Results  Component Value Date   WBC 6.3 12/19/2023   HGB 11.1 (L) 12/19/2023   HCT 32.8 (L) 12/19/2023   PLT 185.0 12/19/2023   GLUCOSE 103 (H) 12/19/2023   CHOL 223 (H) 06/21/2023   TRIG 83.0 06/21/2023   HDL 65.50 06/21/2023   LDLDIRECT 143.0 06/21/2023   LDLCALC 140 (H) 06/21/2023   ALT 8 12/19/2023   AST 12 12/19/2023   NA 141 12/19/2023   K 4.1 12/19/2023   CL 106 12/19/2023   CREATININE 1.15 12/19/2023   BUN 36 (H) 12/19/2023   CO2 27 12/19/2023   TSH 4.49 10/12/2022   HGBA1C 6.1 12/19/2023   MICROALBUR 0.4 02/21/2019    MM 3D SCREENING MAMMOGRAM BILATERAL BREAST Result Date: 12/11/2023 CLINICAL DATA:  Screening. EXAM: DIGITAL SCREENING BILATERAL MAMMOGRAM WITH TOMOSYNTHESIS AND CAD TECHNIQUE: Bilateral screening digital  craniocaudal and mediolateral oblique mammograms were obtained. Bilateral screening digital breast tomosynthesis was performed. The images were evaluated with computer-aided detection. COMPARISON:  Previous exam(s). ACR Breast Density Category a: The breasts are almost entirely fatty. FINDINGS: There are no findings suspicious for malignancy. IMPRESSION: No mammographic evidence of malignancy. A result letter of this screening mammogram will be mailed directly to the patient. RECOMMENDATION: Screening mammogram in one year. (Code:SM-B-01Y) BI-RADS CATEGORY  1: Negative. Electronically Signed   By: Reyes Phi M.D.   On: 12/11/2023 07:45    Assessment & Plan:  .Acute  non-recurrent ethmoidal sinusitis Assessment & Plan: Given chronicity of symptoms, development of facial pain and exam consistent with bacterial URI,  Will treat with empiric antibiotics, decongestants, and saline lavage.  Adding steroid nasal spray if no improvement in one week Daily use of a probiotic advised for 3 weeks.  .    Orders: -     CBC with Differential/Platelet  GERD without esophagitis -     Omeprazole ; TAKE 1 CAPSULE(40 MG) BY MOUTH DAILY  Dispense: 90 capsule; Refill: 3  Pure hypercholesterolemia -     Rosuvastatin  Calcium ; Take 1 tablet (5 mg total) by mouth daily.  Dispense: 90 tablet; Refill: 1  Prediabetes -     Comprehensive metabolic panel with GFR -     Hemoglobin A1c  Pulmonary emphysema, unspecified emphysema type (HCC) Assessment & Plan: Secondary to History of  tobacco abuse .PFTS done remotely.  Not symptomatic with ADLs. She is currently asymptomatic    Allergic rhinitis due to grass pollen Assessment & Plan: Continue anthistamine bid and sinus rinse after outdoor work.  Add steroid nasal spray if current symptoms do not resolve after treatment for acute sinusitis    Benign essential tremor Assessment & Plan: She was referred f to Dr Tobie or neurological evaluation after EMG showed 3Hz  tremor  in the right posterior tibialis muscle upon needle insertion. Needle EMG of the remaining muscles were normal. BET diagnosed.    Spondylolisthesis of lumbosacral region Assessment & Plan: She is awating authorization for ESI    Other orders -     Acyclovir ; TAKE 1 TABLET BY MOUTH EVERY DAY FOR PREVENTION OR OUTBREAK  Dispense: 90 tablet; Refill: 1 -     Metoprolol  Succinate ER; Take 1 tablet (50 mg total) by mouth daily.  Dispense: 90 tablet; Refill: 1 -     Amoxicillin -Pot Clavulanate; Take 1 tablet by mouth 2 (two) times daily.  Dispense: 14 tablet; Refill: 0 -     predniSONE ; 6 tablets on Day 1 , then reduce by 1 tablet daily until gone  Dispense: 21 tablet; Refill: 0     Follow-up: Return in about 6 months (around 06/20/2024).   Verneita LITTIE Kettering, MD

## 2023-12-19 NOTE — Patient Instructions (Signed)
 I am treating you for sinusitis.   I am prescribing an antibiotic (augmentin  ) and a prednisone  taper  To manage the infection and the inflammation in your ear/sinuses.   I also advise use of the following OTC meds to help with your other symptoms.   Take generic Delsym or robitussin for the cough    Please continue to take a probiotic ( Align, Floraque or Culturelle), the generic version of one of these over the counter medications,Yogurt, for a minimum of 3 weeks  ANY TIME YOU ARE TREATED WITH AN ANTIBIOTIIC to prevent a serious antibiotic associated diarrhea  Called clostridium dificile colitis.  Taking a probiotic may also prevent vaginitis due to yeast infections and can be continued indefinitely if you feel that it improves your digestion or your elimination (bowels).   I will be happy to refer you if Dr Carilyn has someone in mind . Just let me know

## 2023-12-20 ENCOUNTER — Ambulatory Visit: Payer: Self-pay | Admitting: Internal Medicine

## 2023-12-20 LAB — COMPREHENSIVE METABOLIC PANEL WITH GFR
ALT: 8 U/L (ref 0–35)
AST: 12 U/L (ref 0–37)
Albumin: 4.6 g/dL (ref 3.5–5.2)
Alkaline Phosphatase: 68 U/L (ref 39–117)
BUN: 36 mg/dL — ABNORMAL HIGH (ref 6–23)
CO2: 27 meq/L (ref 19–32)
Calcium: 9.7 mg/dL (ref 8.4–10.5)
Chloride: 106 meq/L (ref 96–112)
Creatinine, Ser: 1.15 mg/dL (ref 0.40–1.20)
GFR: 52.19 mL/min — ABNORMAL LOW (ref >60.00)
Glucose, Bld: 103 mg/dL — ABNORMAL HIGH (ref 70–99)
Potassium: 4.1 meq/L (ref 3.5–5.1)
Sodium: 141 meq/L (ref 135–145)
Total Bilirubin: 0.4 mg/dL (ref 0.2–1.2)
Total Protein: 7.4 g/dL (ref 6.0–8.3)

## 2023-12-20 LAB — CBC WITH DIFFERENTIAL/PLATELET
Basophils Absolute: 0 10*3/uL (ref 0.0–0.1)
Basophils Relative: 0.6 % (ref 0.0–3.0)
Eosinophils Absolute: 0.1 10*3/uL (ref 0.0–0.7)
Eosinophils Relative: 1.8 % (ref 0.0–5.0)
HCT: 32.8 % — ABNORMAL LOW (ref 36.0–46.0)
Hemoglobin: 11.1 g/dL — ABNORMAL LOW (ref 12.0–15.0)
Lymphocytes Relative: 28.5 % (ref 12.0–46.0)
Lymphs Abs: 1.8 10*3/uL (ref 0.7–4.0)
MCHC: 33.9 g/dL (ref 30.0–36.0)
MCV: 88.7 fl (ref 78.0–100.0)
Monocytes Absolute: 0.4 10*3/uL (ref 0.1–1.0)
Monocytes Relative: 6.1 % (ref 3.0–12.0)
Neutro Abs: 3.9 10*3/uL (ref 1.4–7.7)
Neutrophils Relative %: 63 % (ref 43.0–77.0)
Platelets: 185 10*3/uL (ref 150.0–400.0)
RBC: 3.69 Mil/uL — ABNORMAL LOW (ref 3.87–5.11)
RDW: 13.6 % (ref 11.5–15.5)
WBC: 6.3 10*3/uL (ref 4.0–10.5)

## 2023-12-20 LAB — HEMOGLOBIN A1C: Hgb A1c MFr Bld: 6.1 % (ref 4.6–6.5)

## 2023-12-22 DIAGNOSIS — J0120 Acute ethmoidal sinusitis, unspecified: Secondary | ICD-10-CM | POA: Insufficient documentation

## 2023-12-22 NOTE — Assessment & Plan Note (Addendum)
 She was referred f to Dr Tobie or neurological evaluation after EMG showed 3Hz  tremor in the right posterior tibialis muscle upon needle insertion. Needle EMG of the remaining muscles were normal. BET diagnosed.

## 2023-12-22 NOTE — Assessment & Plan Note (Signed)
Secondary to History of  tobacco abuse .PFTS done remotely.  Not symptomatic with ADLs. She is currently asymptomatic

## 2023-12-22 NOTE — Assessment & Plan Note (Addendum)
 Given chronicity of symptoms, development of facial pain and exam consistent with bacterial URI,  Will treat with empiric antibiotics, decongestants, and saline lavage.  Adding steroid nasal spray if no improvement in one week Daily use of a probiotic advised for 3 weeks.  SABRA

## 2023-12-22 NOTE — Assessment & Plan Note (Signed)
 She is Biomedical engineer authorization for ESI

## 2023-12-22 NOTE — Assessment & Plan Note (Signed)
 Continue anthistamine bid and sinus rinse after outdoor work.  Add steroid nasal spray if current symptoms do not resolve after treatment for acute sinusitis

## 2023-12-28 ENCOUNTER — Encounter: Attending: Physical Medicine & Rehabilitation | Admitting: Physical Medicine & Rehabilitation

## 2023-12-28 ENCOUNTER — Encounter: Payer: Self-pay | Admitting: Physical Medicine & Rehabilitation

## 2023-12-28 ENCOUNTER — Other Ambulatory Visit: Payer: Self-pay | Admitting: Internal Medicine

## 2023-12-28 VITALS — BP 96/64 | HR 66 | Ht 63.0 in | Wt 211.0 lb

## 2023-12-28 DIAGNOSIS — M4317 Spondylolisthesis, lumbosacral region: Secondary | ICD-10-CM | POA: Insufficient documentation

## 2023-12-28 NOTE — Patient Instructions (Signed)
 Reduce Lyrica  to 150 mg twice daily for 1 week Then 100mg  twice a day for 1 week then stop  Call if pain worsens as you wean of Lyrica 

## 2023-12-28 NOTE — Progress Notes (Signed)
 Subjective:    Patient ID: Melanie Fisher, female    DOB: 05/29/1966, 58 y.o.   MRN: [id number]  HPI  10/25/2023 EMG/NCV was normal right lower extremity.  A tremor was noted during the EMG and referral was made to neurology and they did not find any evidence of Parkinson's disease , felt this was a benign tremor and no medication management was initiated  Pregabalin  150mg  TID pt does not feel benefit   Pain Inventory Average Pain 9 Pain Right Now 9 My pain is sharp, burning, and tingling  In the last 24 hours, has pain interfered with the following? General activity 8 Relation with others 8 Enjoyment of life 8 What TIME of day is your pain at its worst? morning , daytime, evening, and night Sleep (in general) Fair  Pain is worse with: walking, bending, sitting, and standing Pain improves with: nothing Relief from Meds: .  Family History  Problem Relation Age of Onset   Kidney disease Mother 11       ESKD, partial neprhectomy   COPD Mother    Heart disease Father        CABG at age 24   Alcohol abuse Father        until late 1   Diabetes Father    Hypertension Father    Heart disease Sister 95       first MI at age 31   Diabetes Sister    Tremor Sister    Tremor Sister    Cancer Sister        uterine ca   Stroke Brother 79   Cancer Maternal Uncle 60       stage 4 colon CA   Colon cancer Maternal Uncle    Esophageal cancer Neg Hx    Pancreatic cancer Neg Hx    Rectal cancer Neg Hx    Stomach cancer Neg Hx    Breast cancer Neg Hx    Social History   Socioeconomic History   Marital status: Married    Spouse name: Not on file   Number of children: Not on file   Years of education: Not on file   Highest education level: Not on file  Occupational History   Occupation: [employer]  Tobacco Use   Smoking status: Former    Current packs/day: 0.00    Average packs/day: 0.5 packs/day for 21.0 years (10.5 ttl pk-yrs)    Types: Cigarettes    Start date: 08/1999     Quit date: 08/2020    Years since quitting: 3.3   Smokeless tobacco: Never  Vaping Use   Vaping status: Every Day   Substances: Nicotine , Flavoring  Substance and Sexual Activity   Alcohol use: Not Currently    Comment: quit drinking    Drug use: No   Sexual activity: Not Currently  Other Topics Concern   Not on file  Social History Narrative   Married as of 05/30/20   Former smoker quit 07/29/19 cigarettes       Are you right handed or left handed? Right Handed   Are you currently employed ? Yes   What is your current occupation?   Do you live at home alone? No    Who lives with you? With husband    What type of home do you live in: 1 story or 2 story? Lives in a one story home        Social Drivers of Health   Financial Resource Strain: Unknown (  01/28/2021)   Overall Financial Resource Strain (CARDIA)    Difficulty of Paying Living Expenses: Patient declined  Food Insecurity: Unknown (01/28/2021)   Hunger Vital Sign    Worried About Running Out of Food in the Last Year: Patient declined    Ran Out of Food in the Last Year: Patient declined  Transportation Needs: Unknown (01/28/2021)   PRAPARE - Transportation    Lack of Transportation (Medical): Patient declined    Lack of Transportation (Non-Medical): Patient declined  Physical Activity: Sufficiently Active (01/28/2021)   Exercise Vital Sign    Days of Exercise per Week: 3 days    Minutes of Exercise per Session: 50 min  Stress: Unknown (01/28/2021)   [employer] of Occupational Health - Occupational Stress Questionnaire    Feeling of Stress : Patient declined  Social Connections: Socially Integrated (01/28/2021)   Social Connection and Isolation Panel    Frequency of Communication with Friends and Family: More than three times a week    Frequency of Social Gatherings with Friends and Family: Three times a week    Attends Religious Services: 1 to 4 times per year    Active Member of Clubs or Organizations: Yes     Attends Banker Meetings: Patient declined    Marital Status: Married   Past Surgical History:  Procedure Laterality Date   ABDOMINAL HYSTERECTOMY  2008   partial   COLONOSCOPY WITH PROPOFOL  N/A 06/04/2018   Procedure: COLONOSCOPY WITH PROPOFOL ;  Surgeon: Therisa Bi, MD;  Location: [facility] ENDOSCOPY;  Service: Gastroenterology;  Laterality: N/A;   COLONOSCOPY WITH PROPOFOL  N/A 07/27/2023   Procedure: COLONOSCOPY WITH PROPOFOL ;  Surgeon: Therisa Bi, MD;  Location: [facility] ENDOSCOPY;  Service: Gastroenterology;  Laterality: N/A;   POLYPECTOMY  07/27/2023   Procedure: POLYPECTOMY;  Surgeon: Therisa Bi, MD;  Location: [facility] ENDOSCOPY;  Service: Gastroenterology;;   right toe bunionectomy     right wrist ganglion cyst removal     Past Surgical History:  Procedure Laterality Date   ABDOMINAL HYSTERECTOMY  2008   partial   COLONOSCOPY WITH PROPOFOL  N/A 06/04/2018   Procedure: COLONOSCOPY WITH PROPOFOL ;  Surgeon: Therisa Bi, MD;  Location: [facility] ENDOSCOPY;  Service: Gastroenterology;  Laterality: N/A;   COLONOSCOPY WITH PROPOFOL  N/A 07/27/2023   Procedure: COLONOSCOPY WITH PROPOFOL ;  Surgeon: Therisa Bi, MD;  Location: [facility] ENDOSCOPY;  Service: Gastroenterology;  Laterality: N/A;   POLYPECTOMY  07/27/2023   Procedure: POLYPECTOMY;  Surgeon: Therisa Bi, MD;  Location: [facility] ENDOSCOPY;  Service: Gastroenterology;;   right toe bunionectomy     right wrist ganglion cyst removal     Past Medical History:  Diagnosis Date   Allergy    Anxiety    Asthma    Depression    GERD (gastroesophageal reflux disease)    Heart murmur    Hyperlipidemia    Hypertension    Sleep apnea    Tobacco abuse    BP 96/64   Pulse 66   Ht 5' 3 (1.6 m)   Wt 211 lb (95.7 kg)   SpO2 96%   BMI 37.38 kg/m   Opioid Risk Score:   Fall Risk Score:  `1  Depression screen PHQ 2/9     12/19/2023    2:09 PM 10/26/2023    3:05 PM 09/14/2023   12:41 PM 08/24/2023    2:11 PM 06/21/2023   11:34 AM 05/25/2023     2:05 PM 04/20/2023    3:25 PM  Depression screen PHQ 2/9  Decreased Interest 0  1 1 1  0 0 0  Down, Depressed, Hopeless 0 1 1 1 2  0 0  PHQ - 2 Score 0 2 2 2 2  0 0  Altered sleeping 0    0    Tired, decreased energy 0    0    Change in appetite 0    2    Feeling bad or failure about yourself  0    1    Trouble concentrating 0    0    Moving slowly or fidgety/restless 0    0    Suicidal thoughts 0    0    PHQ-9 Score 0    5    Difficult doing work/chores Not difficult at all    Not difficult at all       Review of Systems  Musculoskeletal:  Positive for back pain.       Right leg pain  All other systems reviewed and are negative.      Objective:   Physical Exam        Assessment & Plan:   1.  Chronic low back pain plus right lower extremity pain.  MRI did not show any signs of nerve root compression, EMG did not show any signs of neuropathy.  At this point we discussed that really no further diagnostic testing is indicated unless her symptoms change.  She does not feel the Lyrica  is helping much in terms of her pain complaints.  She remains independent with all self-care and mobility skills.  She did have grade 1 spondylolisthesis L5 on S1 but lumbar medial branch blocks L3-L4 as well as L5 dorsal ramus did not provide more than 40% pain relief. At this point we will have her come back on a as needed basis She is to call if during the Lyrica  wean she becomes more symptomatic and we can resume the lowest effective dose.

## 2023-12-31 ENCOUNTER — Other Ambulatory Visit: Payer: Self-pay | Admitting: Internal Medicine

## 2023-12-31 MED ORDER — SERTRALINE HCL 100 MG PO TABS: 100.0000 mg | ORAL_TABLET | Freq: Every day | ORAL | 0 refills | Status: DC

## 2023-12-31 NOTE — Telephone Encounter (Signed)
 Spoke with patient local pharmacy and was informed that patient has two Rx's back in April. She was able to fill the #90 with 0 refills and the other Rx of #90 with 1 refill was closed. Pharmacy technician says she is not sure why the Rx with the refill was closed. Will get another #90 with 0 refills sent over to patient's local pharmacy.

## 2023-12-31 NOTE — Telephone Encounter (Signed)
 Copied from CRM [id number]. Topic: Clinical - Medication Question >> Dec 31, 2023  9:48 AM Melanie Fisher wrote: Reason for CRM: pt needs medication sent in to pharmacy for refill (sertraline )

## 2024-03-25 ENCOUNTER — Other Ambulatory Visit: Payer: Self-pay | Admitting: Internal Medicine

## 2024-04-12 ENCOUNTER — Other Ambulatory Visit: Payer: Self-pay | Admitting: Internal Medicine

## 2024-04-19 ENCOUNTER — Other Ambulatory Visit: Payer: Self-pay

## 2024-04-19 ENCOUNTER — Emergency Department
Admission: EM | Admit: 2024-04-19 | Discharge: 2024-04-19 | Disposition: A | Discharge status: Home / Self Care | Attending: Emergency Medicine | Admitting: Emergency Medicine

## 2024-04-19 ENCOUNTER — Emergency Department

## 2024-04-19 DIAGNOSIS — B9789 Other viral agents as the cause of diseases classified elsewhere: Secondary | ICD-10-CM | POA: Diagnosis not present

## 2024-04-19 DIAGNOSIS — J029 Acute pharyngitis, unspecified: Secondary | ICD-10-CM | POA: Diagnosis not present

## 2024-04-19 DIAGNOSIS — J028 Acute pharyngitis due to other specified organisms: Secondary | ICD-10-CM | POA: Insufficient documentation

## 2024-04-19 DIAGNOSIS — J4489 Other specified chronic obstructive pulmonary disease: Secondary | ICD-10-CM | POA: Diagnosis not present

## 2024-04-19 DIAGNOSIS — J40 Bronchitis, not specified as acute or chronic: Secondary | ICD-10-CM | POA: Diagnosis not present

## 2024-04-19 DIAGNOSIS — R059 Cough, unspecified: Secondary | ICD-10-CM | POA: Diagnosis not present

## 2024-04-19 DIAGNOSIS — I10 Essential (primary) hypertension: Secondary | ICD-10-CM | POA: Insufficient documentation

## 2024-04-19 LAB — COMPREHENSIVE METABOLIC PANEL WITH GFR
ALT: 9 U/L (ref 0–44)
AST: 13 U/L — ABNORMAL LOW (ref 15–41)
Albumin: 3.7 g/dL (ref 3.5–5.0)
Alkaline Phosphatase: 70 U/L (ref 38–126)
Anion gap: 12 (ref 5–15)
BUN: 16 mg/dL (ref 6–20)
CO2: 24 mmol/L (ref 22–32)
Calcium: 9.3 mg/dL (ref 8.9–10.3)
Chloride: 104 mmol/L (ref 98–111)
Creatinine, Ser: 0.75 mg/dL (ref 0.44–1.00)
GFR, Estimated: >60 mL/min (ref >60)
Glucose, Bld: 97 mg/dL (ref 70–99)
Potassium: 4 mmol/L (ref 3.5–5.1)
Sodium: 140 mmol/L (ref 135–145)
Total Bilirubin: 0.5 mg/dL (ref 0.0–1.2)
Total Protein: 8.1 g/dL (ref 6.5–8.1)

## 2024-04-19 LAB — RESP PANEL BY RT-PCR (RSV, FLU A&B, COVID)  RVPGX2
Influenza A by PCR: NEGATIVE
Influenza B by PCR: NEGATIVE
Resp Syncytial Virus by PCR: NEGATIVE
SARS Coronavirus 2 by RT PCR: NEGATIVE

## 2024-04-19 LAB — CBC
HCT: 37.3 % (ref 36.0–46.0)
Hemoglobin: 11.9 g/dL — ABNORMAL LOW (ref 12.0–15.0)
MCH: 29.2 pg (ref 26.0–34.0)
MCHC: 31.9 g/dL (ref 30.0–36.0)
MCV: 91.4 fL (ref 80.0–100.0)
Platelets: 221 K/uL (ref 150–400)
RBC: 4.08 MIL/uL (ref 3.87–5.11)
RDW: 12 % (ref 11.5–15.5)
WBC: 8.2 K/uL (ref 4.0–10.5)
nRBC: 0 % (ref 0.0–0.2)

## 2024-04-19 LAB — URINALYSIS, ROUTINE W REFLEX MICROSCOPIC
Bacteria, UA: NONE SEEN
Bilirubin Urine: NEGATIVE
Glucose, UA: NEGATIVE mg/dL
Hgb urine dipstick: NEGATIVE
Ketones, ur: NEGATIVE mg/dL
Nitrite: NEGATIVE
Protein, ur: NEGATIVE mg/dL
Specific Gravity, Urine: 1.024 (ref 1.005–1.030)
pH: 6 (ref 5.0–8.0)

## 2024-04-19 LAB — LIPASE, BLOOD: Lipase: 24 U/L (ref 11–51)

## 2024-04-19 LAB — GROUP A STREP BY PCR: Group A Strep by PCR: NOT DETECTED

## 2024-04-19 MED ORDER — ONDANSETRON 4 MG PO TBDP: 4.0000 mg | ORAL_TABLET | Freq: Three times a day (TID) | ORAL | 0 refills | Status: DC | PRN

## 2024-04-19 MED ORDER — IPRATROPIUM-ALBUTEROL 0.5-2.5 (3) MG/3ML IN SOLN
3.0000 mL | Freq: Once | RESPIRATORY_TRACT | Status: AC
  Administered 2024-04-19: 3 mL via RESPIRATORY_TRACT
  Filled 2024-04-19: qty 3

## 2024-04-19 MED ORDER — ALBUTEROL SULFATE HFA 108 (90 BASE) MCG/ACT IN AERS: 2.0000 | INHALATION_SPRAY | Freq: Four times a day (QID) | RESPIRATORY_TRACT | 0 refills | Status: AC | PRN

## 2024-04-19 NOTE — ED Triage Notes (Signed)
 Pt to ED via POV from home. Pt reports cough and sore throat since Monday. Pt reports a day later some N/V/D and abd pain. No known sick contacts

## 2024-04-19 NOTE — ED Provider Notes (Signed)
 [facility] Provider Note    Event Date/Time   First MD Initiated Contact with Patient 04/19/24 1249     (approximate)   History   Chief Complaint Cough, Sore Throat, and Emesis   HPI  Melanie Fisher is a 58 y.o. female with past medical history of hypertension, hyperlipidemia, GERD, and COPD who presents to the ED complaining of cough.  Patient reports that she has been feeling ill for the past 5 days with a dry cough, chills, nausea, sore throat, and diarrhea.  She is not aware of any fevers and denies any vomiting, has been eating and drinking normally.  She also denies any chest pain, shortness of breath, abdominal pain, or dysuria.  She is not aware of any sick contacts.     Physical Exam   Triage Vital Signs: ED Triage Vitals [04/19/24 1144]  Encounter Vitals Group     BP (!) 155/92     Girls Systolic BP Percentile      Girls Diastolic BP Percentile      Boys Systolic BP Percentile      Boys Diastolic BP Percentile      Pulse Rate 76     Resp 18     Temp 98.6 F (37 C)     Temp Source Oral     SpO2 98 %     Weight      Height      Head Circumference      Peak Flow      Pain Score 10     Pain Loc      Pain Education      Exclude from Growth Chart     Most recent vital signs: Vitals:   04/19/24 1144  BP: (!) 155/92  Pulse: 76  Resp: 18  Temp: 98.6 F (37 C)  SpO2: 98%    Constitutional: Alert and oriented. Eyes: Conjunctivae are normal. Head: Atraumatic. Nose: No congestion/rhinnorhea. Mouth/Throat: Mucous membranes are moist.  Posterior oropharynx with mild erythema but no significant edema or exudates. Cardiovascular: Normal rate, regular rhythm. Grossly normal heart sounds.  2+ radial pulses bilaterally. Respiratory: Normal respiratory effort.  No retractions. Lungs CTAB. Gastrointestinal: Soft and nontender. No distention. Musculoskeletal: No lower extremity tenderness nor edema.  Neurologic:  Normal speech and  language. No gross focal neurologic deficits are appreciated.    ED Results / Procedures / Treatments   Labs (all labs ordered are listed, but only abnormal results are displayed) Labs Reviewed  COMPREHENSIVE METABOLIC PANEL WITH GFR - Abnormal; Notable for the following components:      Result Value   AST 13 (*)    All other components within normal limits  CBC - Abnormal; Notable for the following components:   Hemoglobin 11.9 (*)    All other components within normal limits  URINALYSIS, ROUTINE W REFLEX MICROSCOPIC - Abnormal; Notable for the following components:   Color, Urine YELLOW (*)    APPearance HAZY (*)    Leukocytes,Ua LARGE (*)    All other components within normal limits  GROUP A STREP BY PCR  RESP PANEL BY RT-PCR (RSV, FLU A&B, COVID)  [id number]  LIPASE, BLOOD    RADIOLOGY Chest x-ray reviewed and interpreted by me with no infiltrate, edema, or effusion.  PROCEDURES:  Critical Care performed: No  Procedures   MEDICATIONS ORDERED IN ED: Medications  ipratropium-albuterol  (DUONEB) 0.5-2.5 (3) MG/3ML nebulizer solution 3 mL (3 mLs Nebulization Given 04/19/24 1322)     IMPRESSION /  MDM / ASSESSMENT AND PLAN / ED COURSE  I reviewed the triage vital signs and the nursing notes.                              58 y.o. female with past medical history of hypertension, hyperlipidemia, GERD, and asthma who presents to the ED complaining of 5 days of cough, sore throat, diarrhea, and nausea.  Patient's presentation is most consistent with acute presentation with potential threat to life or bodily function.  Differential diagnosis includes, but is not limited to, COVID-19, influenza, strep pharyngitis, pneumonia, COPD exacerbation, anemia, electrolyte abnormality, AKI.  Patient well-appearing and in no acute distress, vital signs are unremarkable.  Examination of her posterior oropharynx is unremarkable and her lungs are clear to auscultation bilaterally.  COVID and  flu testing is negative, strep testing also negative.  Labs without significant anemia, leukocytosis, electrolyte abnormality, or AKI.  We will check chest x-ray and treat symptomatically with DuoNeb.  Chest x-ray is unremarkable, patient appropriate for outpatient management with PCP follow-up.  We will prescribe symptomatic treatment with albuterol  and Zofran , she was counseled to return to the ED for new or worsening symptoms.  Patient and spouse agree with plan.      FINAL CLINICAL IMPRESSION(S) / ED DIAGNOSES   Final diagnoses:  Bronchitis  Viral pharyngitis     Rx / DC Orders   ED Discharge Orders          Ordered    albuterol  (VENTOLIN  HFA) 108 (90 Base) MCG/ACT inhaler  Every 6 hours PRN       Note to Pharmacy: Please supply with spacer   04/19/24 1337    ondansetron  (ZOFRAN -ODT) 4 MG disintegrating tablet  Every 8 hours PRN        04/19/24 1337             Note:  This document was prepared using Dragon voice recognition software and may include unintentional dictation errors.   Willo Dunnings, MD 04/19/24 [phone]

## 2024-04-22 ENCOUNTER — Other Ambulatory Visit: Payer: Self-pay

## 2024-04-22 MED ORDER — AMLODIPINE BESYLATE 10 MG PO TABS: ORAL_TABLET | ORAL | 1 refills | Status: AC

## 2024-06-23 ENCOUNTER — Encounter: Payer: Self-pay | Admitting: Internal Medicine

## 2024-06-23 ENCOUNTER — Ambulatory Visit: Admitting: Internal Medicine

## 2024-06-23 VITALS — BP 142/78 | HR 70 | Ht 63.0 in | Wt 206.8 lb

## 2024-06-23 DIAGNOSIS — E7800 Pure hypercholesterolemia, unspecified: Secondary | ICD-10-CM | POA: Diagnosis not present

## 2024-06-23 DIAGNOSIS — G4733 Obstructive sleep apnea (adult) (pediatric): Secondary | ICD-10-CM | POA: Diagnosis not present

## 2024-06-23 DIAGNOSIS — R5383 Other fatigue: Secondary | ICD-10-CM | POA: Diagnosis not present

## 2024-06-23 DIAGNOSIS — F409 Phobic anxiety disorder, unspecified: Secondary | ICD-10-CM

## 2024-06-23 DIAGNOSIS — I10 Essential (primary) hypertension: Secondary | ICD-10-CM

## 2024-06-23 DIAGNOSIS — M4317 Spondylolisthesis, lumbosacral region: Secondary | ICD-10-CM | POA: Diagnosis not present

## 2024-06-23 DIAGNOSIS — F5105 Insomnia due to other mental disorder: Secondary | ICD-10-CM | POA: Diagnosis not present

## 2024-06-23 DIAGNOSIS — Z23 Encounter for immunization: Secondary | ICD-10-CM

## 2024-06-23 DIAGNOSIS — R7303 Prediabetes: Secondary | ICD-10-CM | POA: Diagnosis not present

## 2024-06-23 MED ORDER — ROSUVASTATIN CALCIUM 5 MG PO TABS: 5.0000 mg | ORAL_TABLET | Freq: Every day | ORAL | 1 refills | Status: AC

## 2024-06-23 MED ORDER — METOPROLOL SUCCINATE ER 50 MG PO TB24: 50.0000 mg | ORAL_TABLET | Freq: Every day | ORAL | 1 refills | Status: AC

## 2024-06-23 NOTE — Assessment & Plan Note (Signed)
Managed with ambien cr in the past.  Encouraged to avoid regular use.

## 2024-06-23 NOTE — Assessment & Plan Note (Signed)
 She had minimal results from one  ESI ,  but her pain is improving

## 2024-06-23 NOTE — Assessment & Plan Note (Signed)
 Untreated for years due to mask intolerance.

## 2024-06-23 NOTE — Assessment & Plan Note (Signed)
 she reports compliance with medication regimen  but has an elevated reading today in office.  She is not using NSAIDs daily.  Discussed goal of 120/70  (130/80 for patients over 70)  to preserve renal function.  She has been asked to check her  BP  at home and  submit readings for evaluation. Renal function, electrolytes and screen for proteinuria are all normal .  Lab Results  Component Value Date   CREATININE 0.75 04/19/2024   Lab Results  Component Value Date   MICROALBUR 0.4 02/21/2019      Lab Results  Component Value Date   NA 140 04/19/2024   K 4.0 04/19/2024   CL 104 04/19/2024   CO2 24 04/19/2024

## 2024-06-23 NOTE — Progress Notes (Signed)
 "  Subjective:  Patient ID: Melanie Fisher, female    DOB: [birth year]  Age: 59 y.o. MRN: [id number]  CC: The primary encounter diagnosis was Essential hypertension. Diagnoses of Pure hypercholesterolemia, Prediabetes, Other fatigue, Need for pneumococcal 20-valent conjugate vaccination, Spondylolisthesis of lumbosacral region, OSA (obstructive sleep apnea), and Insomnia due to anxiety and fear were also pertinent to this visit.   HPI JOSEFINA RYNDERS presents for  Chief Complaint  Patient presents with   Medical Management of Chronic Issues    1) Hypertension: patient  has not been checking her  blood pressure  at home.  Readings in the last several months at other offices have been ranging from 130/80 at rest   to 150/90 during an acute episode of bronchitis inn November.  . Patient is following a reduced salt diet most days and is taking medications as prescribed (metoprolol  50 mg , losartan  100 and amlodipine  10 mg    2) had an episode of purplish coloration of index and middle finger of right hand  that she  noticed upon waking,  lasted several hours,  no pain  occurred on New Year's   no unusual activities ,  no contact with dye previously,   No  numbness, weakness or  pain   3) lumbar radiculopathy ; had one ESI unsuccessful,  so she was not advised to have a second  just taking Lyrica ,  pain has improved  4) right knee pain : had  a lateral  steroid  injection by [facility]  last week  . Using a brace     5)  ANXIETY/STRESS:  imporved,  her youngest son staying sober/clean and working full time in [place]   Outpatient Medications Prior to Visit  Medication Sig Dispense Refill   acyclovir  (ZOVIRAX ) 400 MG tablet TAKE 1 TABLET BY MOUTH EVERY DAY FOR PREVENTION OR OUTBREAK 90 tablet 1   albuterol  (VENTOLIN  HFA) 108 (90 Base) MCG/ACT inhaler Inhale 2 puffs into the lungs every 6 (six) hours as needed for wheezing or shortness of breath. 8 g 0   amLODipine  (NORVASC ) 10 MG tablet TAKE 1  TABLET(10 MG) BY MOUTH DAILY 90 tablet 1   diazepam  (VALIUM ) 5 MG tablet Take 1 tablet (5 mg total) by mouth at bedtime as needed for anxiety. 30 tablet 1   losartan  (COZAAR ) 100 MG tablet TAKE 1 TABLET(100 MG) BY MOUTH DAILY 90 tablet 1   omeprazole  (PRILOSEC) 40 MG capsule TAKE 1 CAPSULE(40 MG) BY MOUTH DAILY 90 capsule 3   pregabalin  (LYRICA ) 150 MG capsule Take 1 capsule (150 mg total) by mouth 3 (three) times daily. 90 capsule 1   sertraline  (ZOLOFT ) 100 MG tablet TAKE 1 TABLET(100 MG) BY MOUTH DAILY 90 tablet 1   metoprolol  succinate (TOPROL -XL) 50 MG 24 hr tablet Take 1 tablet (50 mg total) by mouth daily. 90 tablet 1   rosuvastatin  (CRESTOR ) 5 MG tablet Take 1 tablet (5 mg total) by mouth daily. 90 tablet 1   amoxicillin -clavulanate (AUGMENTIN ) 875-125 MG tablet Take 1 tablet by mouth 2 (two) times daily. 14 tablet 0   methocarbamol  (ROBAXIN ) 500 MG tablet Take 1 tablet (500 mg total) by mouth every 8 (eight) hours as needed for muscle spasms. (Patient not taking: Reported on 06/23/2024) 45 tablet 1   ondansetron  (ZOFRAN -ODT) 4 MG disintegrating tablet Take 1 tablet (4 mg total) by mouth every 8 (eight) hours as needed for nausea or vomiting. 12 tablet 0   predniSONE  (DELTASONE ) 10 MG tablet  6 tablets on Day 1 , then reduce by 1 tablet daily until gone 21 tablet 0   No facility-administered medications prior to visit.    Review of Systems;  Patient denies headache, fevers, malaise, unintentional weight loss, skin rash, eye pain, sinus congestion and sinus pain, sore throat, dysphagia,  hemoptysis , cough, dyspnea, wheezing, chest pain, palpitations, orthopnea, edema, abdominal pain, nausea, melena, diarrhea, constipation, flank pain, dysuria, hematuria, urinary  Frequency, nocturia, numbness, tingling, seizures,  Focal weakness, Loss of consciousness,  Tremor, insomnia, depression, anxiety, and suicidal ideation.      Objective:  BP (!) 142/78   Pulse 70   Ht 5' 3 (1.6 m)   Wt 206 lb  12.8 oz (93.8 kg)   SpO2 98%   BMI 36.63 kg/m   BP Readings from Last 3 Encounters:  06/23/24 (!) 142/78  04/19/24 (!) 155/92  12/28/23 96/64    Wt Readings from Last 3 Encounters:  06/23/24 206 lb 12.8 oz (93.8 kg)  12/28/23 211 lb (95.7 kg)  12/19/23 211 lb (95.7 kg)    Physical Exam  Lab Results  Component Value Date   HGBA1C 6.1 12/19/2023   HGBA1C 6.1 06/21/2023   HGBA1C 5.8 10/12/2022    Lab Results  Component Value Date   CREATININE 0.75 04/19/2024   CREATININE 1.15 12/19/2023   CREATININE 0.91 07/23/2023    Lab Results  Component Value Date   WBC 8.2 04/19/2024   HGB 11.9 (L) 04/19/2024   HCT 37.3 04/19/2024   PLT 221 04/19/2024   GLUCOSE 97 04/19/2024   CHOL 223 (H) 06/21/2023   TRIG 83.0 06/21/2023   HDL 65.50 06/21/2023   LDLDIRECT 143.0 06/21/2023   LDLCALC 140 (H) 06/21/2023   ALT 9 04/19/2024   AST 13 (L) 04/19/2024   NA 140 04/19/2024   K 4.0 04/19/2024   CL 104 04/19/2024   CREATININE 0.75 04/19/2024   BUN 16 04/19/2024   CO2 24 04/19/2024   TSH 4.49 10/12/2022   HGBA1C 6.1 12/19/2023   MICROALBUR 0.4 02/21/2019    DG Chest 2 View Result Date: 04/19/2024 EXAM: 2 VIEW(S) XRAY OF THE CHEST 04/19/2024 01:17:00 PM COMPARISON: 10/04/2020 CLINICAL HISTORY: Cough FINDINGS: LUNGS AND PLEURA: No focal pulmonary opacity. No pulmonary edema. No pleural effusion. No pneumothorax. HEART AND MEDIASTINUM: No acute abnormality of the cardiac and mediastinal silhouettes. BONES AND SOFT TISSUES: No acute osseous abnormality. IMPRESSION: 1. No acute cardiopulmonary disease. Electronically signed by: Lynwood Seip MD 04/19/2024 01:31 PM EDT RP Workstation: [id number]    Assessment & Plan:  .Essential hypertension Assessment & Plan: she reports compliance with medication regimen  but has an elevated reading today in office.  She is not using NSAIDs daily.  Discussed goal of 120/70  (130/80 for patients over 70)  to preserve renal function.  She has been asked  to check her  BP  at home and  submit readings for evaluation. Renal function, electrolytes and screen for proteinuria are all normal .  Lab Results  Component Value Date   CREATININE 0.75 04/19/2024   Lab Results  Component Value Date   MICROALBUR 0.4 02/21/2019      Lab Results  Component Value Date   NA 140 04/19/2024   K 4.0 04/19/2024   CL 104 04/19/2024   CO2 24 04/19/2024     Orders: -     Comprehensive metabolic panel with GFR -     Microalbumin / creatinine urine ratio  Pure hypercholesterolemia -  Rosuvastatin  Calcium ; Take 1 tablet (5 mg total) by mouth daily.  Dispense: 90 tablet; Refill: 1 -     Lipid panel -     LDL cholesterol, direct  Prediabetes -     Hemoglobin A1c -     Comprehensive metabolic panel with GFR  Other fatigue -     CBC with Differential/Platelet -     TSH  Need for pneumococcal 20-valent conjugate vaccination -     Pneumococcal conjugate vaccine 20-valent  Spondylolisthesis of lumbosacral region Assessment & Plan: She had minimal results from one  ESI ,  but her pain is improving    OSA (obstructive sleep apnea) Assessment & Plan: Untreated for years due to mask intolerance.    Insomnia due to anxiety and fear Assessment & Plan: Managed with ambien  cr in the past.  Encouraged to avoid regular use.    Other orders -     Metoprolol  Succinate ER; Take 1 tablet (50 mg total) by mouth daily.  Dispense: 90 tablet; Refill: 1   I personally spent a total of 30 minutes in the care of the patient today including preparing to see the patient, getting/reviewing separately obtained history, performing a medically appropriate exam/evaluation, counseling and educating, placing orders, documenting clinical information in the EHR, independently interpreting results, and communicating results.    Follow-up: Return in about 6 months (around 12/21/2024) for physical.   Verneita LITTIE Kettering, MD "

## 2024-06-23 NOTE — Patient Instructions (Addendum)
'  m glad your son is doing so well!   Your BP was a little high today.  BP should be under 130/80 on most readings    please check readings at home and send me 5 readings from different   Continue losartan , metoprolol  and amlodipine

## 2024-06-24 LAB — COMPREHENSIVE METABOLIC PANEL WITH GFR
ALT: 7 U/L (ref 3–35)
AST: 10 U/L (ref 5–37)
Albumin: 4.6 g/dL (ref 3.5–5.2)
Alkaline Phosphatase: 62 U/L (ref 39–117)
BUN: 36 mg/dL — ABNORMAL HIGH (ref 6–23)
CO2: 26 meq/L (ref 19–32)
Calcium: 9.8 mg/dL (ref 8.4–10.5)
Chloride: 102 meq/L (ref 96–112)
Creatinine, Ser: 1.12 mg/dL (ref 0.40–1.20)
GFR: 53.68 mL/min — ABNORMAL LOW
Glucose, Bld: 77 mg/dL (ref 70–99)
Potassium: 4.7 meq/L (ref 3.5–5.1)
Sodium: 138 meq/L (ref 135–145)
Total Bilirubin: 0.4 mg/dL (ref 0.2–1.2)
Total Protein: 7.4 g/dL (ref 6.0–8.3)

## 2024-06-24 LAB — CBC WITH DIFFERENTIAL/PLATELET
Basophils Absolute: 0.1 K/uL (ref 0.0–0.1)
Basophils Relative: 0.7 % (ref 0.0–3.0)
Eosinophils Absolute: 0.1 K/uL (ref 0.0–0.7)
Eosinophils Relative: 0.9 % (ref 0.0–5.0)
HCT: 35.6 % — ABNORMAL LOW (ref 36.0–46.0)
Hemoglobin: 12 g/dL (ref 12.0–15.0)
Lymphocytes Relative: 25.2 % (ref 12.0–46.0)
Lymphs Abs: 2.2 K/uL (ref 0.7–4.0)
MCHC: 33.7 g/dL (ref 30.0–36.0)
MCV: 88.2 fl (ref 78.0–100.0)
Monocytes Absolute: 0.5 K/uL (ref 0.1–1.0)
Monocytes Relative: 5.7 % (ref 3.0–12.0)
Neutro Abs: 5.8 K/uL (ref 1.4–7.7)
Neutrophils Relative %: 67.5 % (ref 43.0–77.0)
Platelets: 233 K/uL (ref 150.0–400.0)
RBC: 4.03 Mil/uL (ref 3.87–5.11)
RDW: 13.8 % (ref 11.5–15.5)
WBC: 8.6 K/uL (ref 4.0–10.5)

## 2024-06-24 LAB — HEMOGLOBIN A1C: Hgb A1c MFr Bld: 6 % (ref 4.6–6.5)

## 2024-06-24 LAB — LIPID PANEL
Cholesterol: 159 mg/dL (ref 28–200)
HDL: 58.6 mg/dL
LDL Cholesterol: 77 mg/dL (ref 10–99)
NonHDL: 100.05
Total CHOL/HDL Ratio: 3
Triglycerides: 114 mg/dL (ref 10.0–149.0)
VLDL: 22.8 mg/dL (ref 0.0–40.0)

## 2024-06-24 LAB — LDL CHOLESTEROL, DIRECT: Direct LDL: 79 mg/dL

## 2024-06-24 LAB — MICROALBUMIN / CREATININE URINE RATIO
Creatinine,U: 91.2 mg/dL
Microalb Creat Ratio: 9.6 mg/g (ref 0.0–30.0)
Microalb, Ur: 0.9 mg/dL (ref 0.7–1.9)

## 2024-06-24 LAB — TSH: TSH: 4.02 u[IU]/mL (ref 0.35–5.50)

## 2024-06-26 ENCOUNTER — Encounter: Payer: Self-pay | Admitting: Internal Medicine

## 2024-06-27 ENCOUNTER — Encounter: Payer: Self-pay | Admitting: Internal Medicine

## 2024-06-27 ENCOUNTER — Other Ambulatory Visit: Payer: Self-pay | Admitting: Internal Medicine

## 2024-06-27 MED ORDER — LOSARTAN POTASSIUM-HCTZ 100-25 MG PO TABS: 1.0000 | ORAL_TABLET | Freq: Every day | ORAL | 0 refills | Status: AC

## 2024-06-27 NOTE — Telephone Encounter (Signed)
 Per Dr Marylynn she sent in Losartan / hydrochlorthiazide  in to your pharmacy, although I did not see a message regarding your leg

## 2024-06-27 NOTE — Telephone Encounter (Signed)
 LVM to call back to office

## 2024-06-29 ENCOUNTER — Ambulatory Visit: Payer: Self-pay | Admitting: Internal Medicine

## 2024-06-30 ENCOUNTER — Other Ambulatory Visit: Payer: Self-pay | Admitting: Internal Medicine

## 2024-06-30 NOTE — Telephone Encounter (Signed)
 Copied from CRM #[id number]. Topic: General - Other >> Jun 27, 2024  4:51 PM Delon DASEN wrote: Reason for CRM: returning call from office- [phone]

## 2024-07-23 ENCOUNTER — Encounter: Payer: Self-pay | Admitting: Internal Medicine

## 2024-07-23 DIAGNOSIS — K219 Gastro-esophageal reflux disease without esophagitis: Secondary | ICD-10-CM

## 2024-07-24 MED ORDER — OMEPRAZOLE 40 MG PO CPDR: DELAYED_RELEASE_CAPSULE | ORAL | 3 refills | Status: AC

## 2024-11-21 ENCOUNTER — Ambulatory Visit: Admitting: Nurse Practitioner

## 2024-12-24 ENCOUNTER — Encounter: Admitting: Internal Medicine
